# Patient Record
Sex: Male | Born: 1940
Health system: Southern US, Community
[De-identification: ages and names within clinical notes are randomized; demographics above are authoritative.]

## PROBLEM LIST (undated history)

## (undated) DIAGNOSIS — D649 Anemia, unspecified: Secondary | ICD-10-CM

## (undated) DIAGNOSIS — K449 Diaphragmatic hernia without obstruction or gangrene: Secondary | ICD-10-CM

## (undated) DIAGNOSIS — N200 Calculus of kidney: Secondary | ICD-10-CM

## (undated) DIAGNOSIS — K219 Gastro-esophageal reflux disease without esophagitis: Secondary | ICD-10-CM

## (undated) DIAGNOSIS — M1990 Unspecified osteoarthritis, unspecified site: Secondary | ICD-10-CM

## (undated) DIAGNOSIS — T7840XA Allergy, unspecified, initial encounter: Secondary | ICD-10-CM

## (undated) DIAGNOSIS — K5900 Constipation, unspecified: Secondary | ICD-10-CM

## (undated) DIAGNOSIS — C44222 Squamous cell carcinoma of skin of right ear and external auricular canal: Secondary | ICD-10-CM

## (undated) DIAGNOSIS — I509 Heart failure, unspecified: Secondary | ICD-10-CM

## (undated) DIAGNOSIS — G4730 Sleep apnea, unspecified: Secondary | ICD-10-CM

## (undated) DIAGNOSIS — I10 Essential (primary) hypertension: Secondary | ICD-10-CM

## (undated) DIAGNOSIS — E785 Hyperlipidemia, unspecified: Secondary | ICD-10-CM

## (undated) HISTORY — DX: Gastro-esophageal reflux disease without esophagitis: K21.9

## (undated) HISTORY — DX: Essential (primary) hypertension: I10

## (undated) HISTORY — DX: Squamous cell carcinoma of skin of right ear and external auricular canal: C44.222

## (undated) HISTORY — DX: Hyperlipidemia, unspecified: E78.5

## (undated) HISTORY — DX: Allergy, unspecified, initial encounter: T78.40XA

## (undated) HISTORY — DX: Constipation, unspecified: K59.00

## (undated) HISTORY — DX: Heart failure, unspecified: I50.9

## (undated) HISTORY — PX: NECK SURGERY: SHX720

## (undated) HISTORY — DX: Unspecified osteoarthritis, unspecified site: M19.90

## (undated) HISTORY — DX: Sleep apnea, unspecified: G47.30

## (undated) HISTORY — DX: Diaphragmatic hernia without obstruction or gangrene: K44.9

## (undated) HISTORY — PX: COLONOSCOPY: SHX174

## (undated) HISTORY — PX: CARDIAC CATHETERIZATION: SHX172

## (undated) HISTORY — PX: POLYPECTOMY: SHX149

## (undated) HISTORY — DX: Anemia, unspecified: D64.9

---

## 1986-12-24 HISTORY — PX: OTHER SURGICAL HISTORY: SHX169

## 1986-12-24 HISTORY — PX: TONSILLECTOMY: SUR1361

## 1996-12-24 HISTORY — PX: HAMMER TOE SURGERY: SHX385

## 1999-06-06 ENCOUNTER — Ambulatory Visit (HOSPITAL_BASED_OUTPATIENT_CLINIC_OR_DEPARTMENT_OTHER): Admission: RE | Admit: 1999-06-06 | Discharge: 1999-06-06 | Payer: Self-pay | Admitting: Urology

## 1999-09-29 ENCOUNTER — Ambulatory Visit (HOSPITAL_COMMUNITY): Admission: RE | Admit: 1999-09-29 | Discharge: 1999-09-29 | Payer: Self-pay | Admitting: *Deleted

## 2000-08-16 ENCOUNTER — Ambulatory Visit (HOSPITAL_COMMUNITY): Admission: RE | Admit: 2000-08-16 | Discharge: 2000-08-16 | Payer: Self-pay | Admitting: *Deleted

## 2001-03-14 ENCOUNTER — Encounter: Admission: RE | Admit: 2001-03-14 | Discharge: 2001-03-14 | Payer: Self-pay | Admitting: *Deleted

## 2001-03-14 ENCOUNTER — Encounter: Payer: Self-pay | Admitting: *Deleted

## 2001-10-28 ENCOUNTER — Other Ambulatory Visit: Admission: RE | Admit: 2001-10-28 | Discharge: 2001-10-28 | Payer: Self-pay | Admitting: Urology

## 2003-02-26 ENCOUNTER — Emergency Department (HOSPITAL_COMMUNITY): Admission: EM | Admit: 2003-02-26 | Discharge: 2003-02-26 | Payer: Self-pay | Admitting: Emergency Medicine

## 2006-01-03 ENCOUNTER — Encounter: Admission: RE | Admit: 2006-01-03 | Discharge: 2006-01-03 | Payer: Self-pay | Admitting: Otolaryngology

## 2010-02-06 ENCOUNTER — Ambulatory Visit (HOSPITAL_COMMUNITY): Admission: RE | Admit: 2010-02-06 | Discharge: 2010-02-06 | Payer: Self-pay | Admitting: Cardiovascular Disease

## 2010-02-22 ENCOUNTER — Ambulatory Visit (HOSPITAL_COMMUNITY): Admission: RE | Admit: 2010-02-22 | Discharge: 2010-02-22 | Payer: Self-pay | Admitting: Cardiovascular Disease

## 2010-10-19 ENCOUNTER — Emergency Department (HOSPITAL_COMMUNITY): Admission: EM | Admit: 2010-10-19 | Discharge: 2010-10-19 | Payer: Self-pay | Admitting: Family Medicine

## 2010-11-03 ENCOUNTER — Encounter: Admission: RE | Admit: 2010-11-03 | Discharge: 2010-11-03 | Payer: Self-pay | Admitting: Otolaryngology

## 2011-05-11 NOTE — Procedures (Signed)
[place]  Patient:    Martin Stephenson, Martin Stephenson                       MRN: [id number] Proc. Date: 08/16/00 Attending:  Sharyn Dross., M.D.                           Procedure Report  PREOPERATIVE DIAGNOSIS:  Lower gastrointestinal bleeding.  POSTOPERATIVE DIAGNOSIS:  Acute inflammation rectosigmoid area as well as in the anal area. Otherwise, normal colonic examination to the midascending colon.  PROCEDURE PERFORMED:  Colonoscopy.  MEDICATIONS USED:  Demerol 75 mg IV, Versed 6 mg over a 10-minute period of time.  INSTRUMENT USED:  Olympus video pancolonoscope.  ENDOSCOPIST:  Dortha Kern, M.D.  INDICATIONS:  This pleasant 70 year old gentleman is known to me in the past and has had a screening test in the past for colon polyps or cancer, was found to have a normal examination.  He has been relatively stable up until today when the patient states he went to move his bowel and had severe pains that was present.  He also noted that he had back pains earlier, but subsequently after having a couple of bouts of diarrhea, he had a large bloody bowel movement today which brought made him nervous and he contacted me from that standpoint.  The patient has not had any previous GI bleeding that was noted in the past.  OBJECTIVE FINDINGS:   He is a pleasant gentleman who appears to be in no distress.  His vital signs are stable.  The HEENT examination was anicteric. Neck was supple.  Lungs were clear.  Heart had a regular rate and rhythm without heaves, thrills, murmurs or gallop.  The abdomen was soft, no tenderness, no hepatosplenomegaly appreciated.  The extremities are within normal limits.  PLAN:  To proceed with the colonoscopic examination.  INFORMED CONSENT:  The patient was advised of the procedure, the indications and the risks involved.  The patient has agreed to have the procedure performed.  PREOPERATIVE PREPARATION:  The patient was  brought to the endoscopy unit where an IV for IV sedating medication was started.  A monitor was placed on the patient to monitor the patients vital signs and oxygen saturation.  Nasal oxygen at two liters a minute was used and after adequate sedation was performed, the procedure was begun.  BOWEL PREP:  The patient was given a Fleets enema as a bowel prep. Otherwise, no preparation was given.  DESCRIPTION OF PROCEDURE:  The instrument was advanced with the patient lying in the left lateral position to approximately 90 cm in the proximal colon to the midproximal ascending colon area.  There appeared to be no gross abnormalities such as masses, polyps or stricture lesions appreciated.  The vascular pattern appeared to be well within normal limits throughout the entire colon except in the rectosigmoid that was noted.  The mucosal pattern showed evidence of acute inflammatory changes in the rectosigmoid area at approximately 30 cm that was noted as well as in the anal region that was noted.  Multiple photographs were taken of the regions at this time.  There was stool noted in the midproximal transverse and ascending colon which appeared to be greenish in color at this time. Photographs were taken of this as a sign of no evidence of any bleeding that was appreciated at this time.  The instrument was not able to advance  to the cecal region because of the increased stool that was present in the region at this time.  As the instrument was retracted back, there was no evidence of any diverticula changes or inflammatory changes that was noted.  Nor any increased tortuosity that was appreciated.  However, as it was retracted back in to the rectosigmoid area there was noted acute inflammatory changes that was appreciated at this time.  Photographs were taken of the area at this time. There was no evidence of active bleeding that was noted at this time, or any mass, granularities that was  appreciated at this time.  The region was not biopsied today.  The instrument was removed per rectum without difficulty that was noted.  The patient tolerated the procedure well.  TREATMENT: 1. I am going to start the patient with Proctofoam with hydrocortisone to    use twice a day, start the initial dose tonight. 2. To have follow-up with me in the office. 3. To repeat the flexible sigmoidoscopy in the office in the next three to    six weeks depending on the results to determine the course of therapy. DD:  08/16/00 TD:  08/19/00 Job: [id number]

## 2011-10-01 ENCOUNTER — Ambulatory Visit (HOSPITAL_COMMUNITY)
Admission: RE | Admit: 2011-10-01 | Discharge: 2011-10-01 | Disposition: A | Discharge status: Home / Self Care | Payer: Medicare Other | Source: Ambulatory Visit | Attending: Family Medicine | Admitting: Family Medicine

## 2011-10-01 ENCOUNTER — Other Ambulatory Visit: Payer: Self-pay | Admitting: Family Medicine

## 2011-10-01 ENCOUNTER — Ambulatory Visit
Admission: RE | Admit: 2011-10-01 | Discharge: 2011-10-01 | Disposition: A | Discharge status: Home / Self Care | Payer: Medicare Other | Source: Ambulatory Visit | Attending: Family Medicine | Admitting: Family Medicine

## 2011-10-01 ENCOUNTER — Other Ambulatory Visit (HOSPITAL_COMMUNITY): Payer: Self-pay | Admitting: Family Medicine

## 2011-10-01 DIAGNOSIS — R1011 Right upper quadrant pain: Secondary | ICD-10-CM

## 2011-10-01 DIAGNOSIS — R52 Pain, unspecified: Secondary | ICD-10-CM

## 2011-10-01 DIAGNOSIS — N1330 Unspecified hydronephrosis: Secondary | ICD-10-CM | POA: Insufficient documentation

## 2011-10-01 DIAGNOSIS — N390 Urinary tract infection, site not specified: Secondary | ICD-10-CM | POA: Insufficient documentation

## 2012-04-18 ENCOUNTER — Encounter: Payer: Self-pay | Admitting: Gastroenterology

## 2012-05-07 ENCOUNTER — Encounter: Payer: Self-pay | Admitting: Gastroenterology

## 2012-05-07 ENCOUNTER — Ambulatory Visit (AMBULATORY_SURGERY_CENTER): Payer: Medicare Other | Admitting: *Deleted

## 2012-05-07 VITALS — Ht 76.0 in | Wt 246.3 lb

## 2012-05-07 DIAGNOSIS — Z800 Family history of malignant neoplasm of digestive organs: Secondary | ICD-10-CM

## 2012-05-07 DIAGNOSIS — K921 Melena: Secondary | ICD-10-CM

## 2012-05-07 MED ORDER — PEG-KCL-NACL-NASULF-NA ASC-C 100 G PO SOLR: ORAL | Status: DC

## 2012-05-09 ENCOUNTER — Encounter: Payer: Self-pay | Admitting: *Deleted

## 2012-05-09 NOTE — Telephone Encounter (Signed)
error 

## 2012-05-20 ENCOUNTER — Telehealth: Payer: Self-pay | Admitting: Gastroenterology

## 2012-05-20 NOTE — Telephone Encounter (Signed)
Rx for Moviprep called in to pharmacy.  I talked with patient; he has picked prep up from pharmacy.

## 2012-05-20 NOTE — Telephone Encounter (Signed)
MoviPrep called in to [facility] on [address].  Message left for pt on home answering machine. Ezra Sites

## 2012-05-21 ENCOUNTER — Encounter: Payer: Self-pay | Admitting: Gastroenterology

## 2012-05-21 ENCOUNTER — Ambulatory Visit (AMBULATORY_SURGERY_CENTER): Payer: Medicare Other | Admitting: Gastroenterology

## 2012-05-21 VITALS — BP 140/78 | HR 61 | Temp 95.8°F | Resp 20 | Ht 76.0 in | Wt 246.0 lb

## 2012-05-21 DIAGNOSIS — K921 Melena: Secondary | ICD-10-CM

## 2012-05-21 DIAGNOSIS — D126 Benign neoplasm of colon, unspecified: Secondary | ICD-10-CM

## 2012-05-21 DIAGNOSIS — R195 Other fecal abnormalities: Secondary | ICD-10-CM

## 2012-05-21 MED ORDER — SODIUM CHLORIDE 0.9 % IV SOLN: 500.0000 mL | INTRAVENOUS | Status: DC

## 2012-05-21 NOTE — Progress Notes (Signed)
Patient did not have preoperative order for IV antibiotic SSI prophylaxis. (G8918)  Patient did not experience any of the following events: a burn prior to discharge; a fall within the facility; wrong site/side/patient/procedure/implant event; or a hospital transfer or hospital admission upon discharge from the facility. (G8907)  

## 2012-05-21 NOTE — Op Note (Signed)
[facility] [address] [place], [place]  [address]  COLONOSCOPY PROCEDURE REPORT  PATIENT:  Martin Stephenson, Martin Stephenson  MR#:  [id number] BIRTHDATE:  03-Feb-1941, 71 yrs. old  GENDER:  male ENDOSCOPIST:  Judie Petit T. Russella Dar, MD, [facility] Referred by:  Guerry Bruin, M.D. PROCEDURE DATE:  05/21/2012 PROCEDURE:  Colonoscopy with snare polypectomy ASA CLASS:  Class II INDICATIONS:  1) heme positive stool  2) hematochezia MEDICATIONS:   MAC sedation, administered by CRNA, propofol (Diprivan) 150 mg IV DESCRIPTION OF PROCEDURE:   After the risks benefits and alternatives of the procedure were thoroughly explained, informed consent was obtained.  Digital rectal exam was performed and revealed no abnormalities.   The LB [id number] endoscope was introduced through the anus and advanced to the cecum, which was identified by both the appendix and ileocecal valve, without limitations.  The quality of the prep was excellent, using MoviPrep.  The instrument was then slowly withdrawn as the colon was fully examined. <<PROCEDUREIMAGES>> FINDINGS:  A sessile polyp was found in the proximal transverse colon. It was 5 mm in size. Polyp was snared without cautery. Retrieval was successful. Mild diverticulosis was found in the sigmoid colon.  Otherwise normal colonoscopy without other polyps, masses, vascular ectasias, or inflammatory changes. Retroflexed views in the rectum revealed internal hemorrhoids, moderate.  The time to cecum =  3.33  minutes. The scope was then withdrawn (time =  9.75  min) from the patient and the procedure completed.  COMPLICATIONS:  None  ENDOSCOPIC IMPRESSION: 1) 5 mm sessile polyp in the proximal transverse colon 2) Mild diverticulosis in the sigmoid colon 3) Internal hemorrhoids  RECOMMENDATIONS: 1) Await pathology results 2) High fiber diet with liberal fluid intake. 3) Repeat Colonoscopy in 5 years if polyp is adenomatous, otherwise 10 years  Tariq Pernell T.  Russella Dar, MD, [facility]  n. eSIGNED:   Venita Lick. Jireh Elmore at 05/21/2012 10:33 AM  Sharolyn Douglas, [id number]

## 2012-05-21 NOTE — Patient Instructions (Signed)
YOU HAD AN ENDOSCOPIC PROCEDURE TODAY AT THE [facility]: Refer to the procedure report that was given to you for any specific questions about what was found during the examination.  If the procedure report does not answer your questions, please call your gastroenterologist to clarify.  If you requested that your care partner not be given the details of your procedure findings, then the procedure report has been included in a sealed envelope for you to review at your convenience later.  YOU SHOULD EXPECT: Some feelings of bloating in the abdomen. Passage of more gas than usual.  Walking can help get rid of the air that was put into your GI tract during the procedure and reduce the bloating. If you had a lower endoscopy (such as a colonoscopy or flexible sigmoidoscopy) you may notice spotting of blood in your stool or on the toilet paper. If you underwent a bowel prep for your procedure, then you may not have a normal bowel movement for a few days.  DIET: Your first meal following the procedure should be a light meal and then it is ok to progress to your normal diet.  A half-sandwich or bowl of soup is an example of a good first meal.  Heavy or fried foods are harder to digest and may make you feel nauseous or bloated.  Likewise meals heavy in dairy and vegetables can cause extra gas to form and this can also increase the bloating.  Drink plenty of fluids but you should avoid alcoholic beverages for 24 hours.  ACTIVITY: Your care partner should take you home directly after the procedure.  You should plan to take it easy, moving slowly for the rest of the day.  You can resume normal activity the day after the procedure however you should NOT DRIVE or use heavy machinery for 24 hours (because of the sedation medicines used during the test).    SYMPTOMS TO REPORT IMMEDIATELY: A gastroenterologist can be reached at any hour.  During normal business hours, 8:30 AM to 5:00 PM Monday through Friday,  call [phone].  After hours and on weekends, please call the GI answering service at [phone] who will take a message and have the physician on call contact you.   Following lower endoscopy (colonoscopy or flexible sigmoidoscopy):  Excessive amounts of blood in the stool  Significant tenderness or worsening of abdominal pains  Swelling of the abdomen that is new, acute  Fever of 100F or higher  Following upper endoscopy (EGD)  Vomiting of blood or coffee ground material  FOLLOW UP: If any biopsies were taken you will be contacted by phone or by letter within the next 1-3 weeks.  Call your gastroenterologist if you have not heard about the biopsies in 3 weeks.  Our staff will call the home number listed on your records the next business day following your procedure to check on you and address any questions or concerns that you may have at that time regarding the information given to you following your procedure. This is a courtesy call and so if there is no answer at the home number and we have not heard from you through the emergency physician on call, we will assume that you have returned to your regular daily activities without incident.  SIGNATURES/CONFIDENTIALITY: You and/or your care partner have signed paperwork which will be entered into your electronic medical record.  These signatures attest to the fact that that the information above on your After Visit Summary has  been reviewed and is understood.  Full responsibility of the confidentiality of this discharge information lies with you and/or your care-partner.  

## 2012-05-22 ENCOUNTER — Telehealth: Payer: Self-pay | Admitting: *Deleted

## 2012-05-22 NOTE — Telephone Encounter (Signed)
  Follow up Call-  Call back number 05/21/2012  Post procedure Call Back phone  # [phone]  Permission to leave phone message Yes     Patient questions:  Do you have a fever, pain , or abdominal swelling? no Pain Score  0 *  Have you tolerated food without any problems? yes  Have you been able to return to your normal activities? yes  Do you have any questions about your discharge instructions: Diet   no Medications  no Follow up visit  no  Do you have questions or concerns about your Care? no  Actions: * If pain score is 4 or above: No action needed, pain <4.

## 2012-05-26 ENCOUNTER — Encounter: Payer: Self-pay | Admitting: Gastroenterology

## 2012-10-16 ENCOUNTER — Telehealth: Payer: Self-pay | Admitting: Medical Oncology

## 2012-10-16 NOTE — Telephone Encounter (Signed)
Opened chart-wrong pt

## 2014-09-10 ENCOUNTER — Other Ambulatory Visit: Payer: Self-pay | Admitting: Orthopaedic Surgery

## 2014-09-10 DIAGNOSIS — M5136 Other intervertebral disc degeneration, lumbar region: Secondary | ICD-10-CM

## 2014-09-18 ENCOUNTER — Other Ambulatory Visit: Payer: Self-pay | Admitting: Orthopaedic Surgery

## 2014-09-18 ENCOUNTER — Ambulatory Visit
Admission: RE | Admit: 2014-09-18 | Discharge: 2014-09-18 | Disposition: A | Discharge status: Home / Self Care | Payer: Medicare Other | Source: Ambulatory Visit | Attending: Orthopaedic Surgery | Admitting: Orthopaedic Surgery

## 2014-09-18 DIAGNOSIS — T1590XA Foreign body on external eye, part unspecified, unspecified eye, initial encounter: Secondary | ICD-10-CM

## 2014-09-18 DIAGNOSIS — M5136 Other intervertebral disc degeneration, lumbar region: Secondary | ICD-10-CM

## 2015-08-25 ENCOUNTER — Other Ambulatory Visit: Payer: Self-pay | Admitting: *Deleted

## 2016-08-11 ENCOUNTER — Emergency Department (HOSPITAL_COMMUNITY)
Admission: EM | Admit: 2016-08-11 | Discharge: 2016-08-11 | Disposition: A | Discharge status: Home / Self Care | Payer: Medicare Other | Attending: Emergency Medicine | Admitting: Emergency Medicine

## 2016-08-11 ENCOUNTER — Encounter (HOSPITAL_COMMUNITY): Payer: Self-pay | Admitting: Emergency Medicine

## 2016-08-11 ENCOUNTER — Emergency Department (HOSPITAL_COMMUNITY): Payer: Medicare Other

## 2016-08-11 DIAGNOSIS — Z7982 Long term (current) use of aspirin: Secondary | ICD-10-CM | POA: Insufficient documentation

## 2016-08-11 DIAGNOSIS — I509 Heart failure, unspecified: Secondary | ICD-10-CM | POA: Diagnosis not present

## 2016-08-11 DIAGNOSIS — R319 Hematuria, unspecified: Secondary | ICD-10-CM | POA: Insufficient documentation

## 2016-08-11 HISTORY — DX: Calculus of kidney: N20.0

## 2016-08-11 LAB — COMPREHENSIVE METABOLIC PANEL
ALT: 15 U/L — ABNORMAL LOW (ref 17–63)
AST: 23 U/L (ref 15–41)
Albumin: 3.6 g/dL (ref 3.5–5.0)
Alkaline Phosphatase: 66 U/L (ref 38–126)
Anion gap: 7 (ref 5–15)
BUN: 27 mg/dL — ABNORMAL HIGH (ref 6–20)
CO2: 26 mmol/L (ref 22–32)
Calcium: 9.8 mg/dL (ref 8.9–10.3)
Chloride: 106 mmol/L (ref 101–111)
Creatinine, Ser: 1.68 mg/dL — ABNORMAL HIGH (ref 0.61–1.24)
GFR calc Af Amer: 45 mL/min — ABNORMAL LOW (ref >60)
GFR calc non Af Amer: 39 mL/min — ABNORMAL LOW (ref >60)
Glucose, Bld: 96 mg/dL (ref 65–99)
Potassium: 4.4 mmol/L (ref 3.5–5.1)
Sodium: 139 mmol/L (ref 135–145)
Total Bilirubin: 0.4 mg/dL (ref 0.3–1.2)
Total Protein: 6.8 g/dL (ref 6.5–8.1)

## 2016-08-11 LAB — URINALYSIS, ROUTINE W REFLEX MICROSCOPIC
Bilirubin Urine: NEGATIVE
Glucose, UA: NEGATIVE mg/dL
Ketones, ur: NEGATIVE mg/dL
Leukocytes, UA: NEGATIVE
Nitrite: NEGATIVE
Protein, ur: NEGATIVE mg/dL
Specific Gravity, Urine: 1.02 (ref 1.005–1.030)
pH: 5.5 (ref 5.0–8.0)

## 2016-08-11 LAB — URINE MICROSCOPIC-ADD ON: WBC, UA: NONE SEEN WBC/hpf (ref 0–5)

## 2016-08-11 LAB — CBC
HCT: 37.4 % — ABNORMAL LOW (ref 39.0–52.0)
Hemoglobin: 12.5 g/dL — ABNORMAL LOW (ref 13.0–17.0)
MCH: 31.8 pg (ref 26.0–34.0)
MCHC: 33.4 g/dL (ref 30.0–36.0)
MCV: 95.2 fL (ref 78.0–100.0)
Platelets: 271 10*3/uL (ref 150–400)
RBC: 3.93 MIL/uL — ABNORMAL LOW (ref 4.22–5.81)
RDW: 13.8 % (ref 11.5–15.5)
WBC: 9.8 10*3/uL (ref 4.0–10.5)

## 2016-08-11 MED ORDER — SODIUM CHLORIDE 0.9 % IV BOLUS (SEPSIS)
1000.0000 mL | Freq: Once | INTRAVENOUS | Status: AC
  Administered 2016-08-11: 1000 mL via INTRAVENOUS

## 2016-08-11 NOTE — ED Triage Notes (Signed)
Pt. reports hematuria , urinary urgency and left lower back pain onset this evening , denies fever or chills .

## 2016-08-11 NOTE — ED Provider Notes (Signed)
[facility] DEPT Provider Note   CSN: [id number] Arrival date & time: 08/11/16  0056     History   Chief Complaint Chief Complaint  Patient presents with  . Hematuria  . Back Pain    HPI Martin Stephenson is a 75 y.o. male.  Pt said that he developed left sided back pain with blood in his urine tonight.  Pt noticed it around midnight.  His wife said that he passed a lot of blood which is why they came in tonight.  Pt said that he has had a kidney stone in the past, but not for a few years.  The pt said that he does not need anything for pain.      Past Medical History:  Diagnosis Date  . Anemia   . Arthritis   . Congestive heart failure ([place])   . Hiatal hernia   . Hyperlipidemia   . Hypertension   . Kidney stone   . Sleep apnea     There are no active problems to display for this patient.   Past Surgical History:  Procedure Laterality Date  . HAMMER TOE SURGERY  1998   1st toe right foot  . hydrocelectomy  1988  . TONSILLECTOMY  1988       Home Medications    Prior to Admission medications   Medication Sig Start Date End Date Taking? Authorizing Provider  amLODipine (NORVASC) 2.5 MG tablet Take 2.5 mg by mouth daily.  05/02/12   Historical Provider, MD  aspirin 81 MG tablet Take 81 mg by mouth daily.    Historical Provider, MD  calcium gluconate 500 MG tablet Take 500 mg by mouth daily.    Historical Provider, MD  celecoxib (CELEBREX) 200 MG capsule Take 200 mg by mouth daily.    Historical Provider, MD  CRESTOR 10 MG tablet Take 10 mg by mouth. Takes 1 tablet Mon, Wed, and Fri 05/06/12   Historical Provider, MD  digoxin (LANOXIN) 0.125 MG tablet Take 125 mcg by mouth daily.  05/02/12   Historical Provider, MD  Ferrous Sulfate (IRON) 325 (65 FE) MG TABS Take by mouth daily.    Historical Provider, MD  fluticasone (FLONASE) 50 MCG/ACT nasal spray Place 1 spray into the nose daily.  02/04/12   Historical Provider, MD  Multiple Vitamin (MULTIVITAMIN) tablet  Take 1 tablet by mouth daily.    Historical Provider, MD    Family History Family History  Problem Relation Age of Onset  . Colon cancer Mother   . Stomach cancer Neg Hx     Social History Social History  Substance Use Topics  . Smoking status: Never Smoker  . Smokeless tobacco: Never Used  . Alcohol use No     Allergies   Furacin [nitrofurazone]   Review of Systems Review of Systems  Genitourinary: Positive for flank pain and hematuria.  All other systems reviewed and are negative.    Physical Exam Updated Vital Signs BP 122/76   Pulse (!) 58   Temp 97.7 F (36.5 C) (Oral)   Resp 12   Ht 6\' 4"  (1.93 m)   Wt 244 lb 8 oz (110.9 kg)   SpO2 99%   BMI 29.76 kg/m   Physical Exam  Constitutional: He is oriented to person, place, and time. He appears well-developed and well-nourished.  HENT:  Head: Normocephalic and atraumatic.  Right Ear: External ear normal.  Left Ear: External ear normal.  Nose: Nose normal.  Mouth/Throat: Oropharynx is clear and moist.  Eyes: Conjunctivae and EOM are normal. Pupils are equal, round, and reactive to light.  Neck: Normal range of motion. Neck supple.  Cardiovascular: Normal rate, regular rhythm, normal heart sounds and intact distal pulses.   Pulmonary/Chest: Effort normal and breath sounds normal.  Abdominal: Soft. Bowel sounds are normal.  Musculoskeletal: Normal range of motion.  Neurological: He is alert and oriented to person, place, and time.  Skin: Skin is warm and dry.  Psychiatric: He has a normal mood and affect. His behavior is normal. Judgment and thought content normal.  Nursing note and vitals reviewed.    ED Treatments / Results  Labs (all labs ordered are listed, but only abnormal results are displayed) Labs Reviewed  COMPREHENSIVE METABOLIC PANEL - Abnormal; Notable for the following:       Result Value   BUN 27 (*)    Creatinine, Ser 1.68 (*)    ALT 15 (*)    GFR calc non Af Amer 39 (*)    GFR  calc Af Amer 45 (*)    All other components within normal limits  CBC - Abnormal; Notable for the following:    RBC 3.93 (*)    Hemoglobin 12.5 (*)    HCT 37.4 (*)    All other components within normal limits  URINALYSIS, ROUTINE W REFLEX MICROSCOPIC (NOT AT [facility]) - Abnormal; Notable for the following:    Hgb urine dipstick LARGE (*)    All other components within normal limits  URINE MICROSCOPIC-ADD ON - Abnormal; Notable for the following:    Squamous Epithelial / LPF 0-5 (*)    Bacteria, UA RARE (*)    All other components within normal limits    EKG  EKG Interpretation None       Radiology Ct Renal Stone Study  Result Date: 08/11/2016 CLINICAL DATA:  Hematuria and low back pain EXAM: CT ABDOMEN AND PELVIS WITHOUT CONTRAST TECHNIQUE: Multidetector CT imaging of the abdomen and pelvis was performed following the standard protocol without oral or intravenous contrast material administration. COMPARISON:  October 01, 2011 FINDINGS: Lower chest:  Lung bases are clear. Hepatobiliary: No focal liver lesions are evident on this noncontrast enhanced study. Gallbladder wall is not appreciably thickened. There is no biliary duct dilatation. Pancreas: No pancreatic mass or inflammatory focus. Spleen: No splenic lesions are evident. Adrenals/Urinary Tract: Adrenals appear within normal limits bilaterally. Kidneys bilaterally show no appreciable mass or hydronephrosis on either side. There is no renal or ureteral calculus on either side. The urinary bladder is midline with wall thickness within normal limits. Stomach/Bowel: There are occasional sigmoid diverticula without diverticulitis. There is no bowel wall or mesenteric thickening. There is no bowel obstruction. No free air or portal venous air evident. Vascular/Lymphatic: There is atherosclerotic calcification in the aorta. There is no abdominal aortic aneurysm. The major mesenteric vessels appear patent on this noncontrast enhanced study. There  are scattered subcentimeter mesenteric lymph nodes. There is no adenopathy by size criteria in the abdomen or pelvis. Reproductive: Prostate and seminal vesicles appear unremarkable except for a small calculus in the inferior prostate on the left. There is no pelvic mass or pelvic fluid collection. Other: The appendix appears normal. There is no abscess or adenopathy in the abdomen or pelvis. There is a minimal ventral hernia containing only fat. There is fat in each inguinal ring. Musculoskeletal: There is degenerative change in the lumbar spine with vacuum phenomenon at L4-5. 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 There is no blastic or lytic bone lesion. There  is no intramuscular or abdominal wall lesion. IMPRESSION: No renal or ureteral calculus. No hydronephrosis. No urinary bladder wall thickening. A cause for hematuria has not been established with this study. Scattered sigmoid diverticula without diverticulitis. No bowel obstruction. No abscess. Appendix appears normal. Aortic atherosclerosis. Subcentimeter scattered mesenteric lymph nodes, a finding that is regarded as nonspecific. No frank adenopathy by size criteria. Small ventral hernia containing only fat. Electronically Signed   By: Lowella Grip III M.D.   On: 08/11/2016 07:36    Procedures Procedures (including critical care time)  Medications Ordered in ED Medications  sodium chloride 0.9 % bolus 1,000 mL (0 mLs Intravenous Stopped 08/11/16 0738)     Initial Impression / Assessment and Plan / ED Course  I have reviewed the triage vital signs and the nursing notes.  Pertinent labs & imaging results that were available during my care of the patient were reviewed by me and considered in my medical decision making (see chart for details).  Clinical Course   Ct ok.  Pt knows to f/u with urology.  Pt does not want anything for pain.  Return if worse.  Final Clinical Impressions(s) / ED Diagnoses   Final diagnoses:  Hematuria    New  Prescriptions New Prescriptions   No medications on file     Isla Pence, MD 08/11/16 [id number]

## 2016-08-11 NOTE — ED Notes (Signed)
Patient transported to CT 

## 2016-08-11 NOTE — ED Notes (Signed)
While walking out to call a pt name for another room the pt wife yelled back to this RN, "how about to get Martin Stephenson back instead, they have been out here since 11" Apologized for the wait but stated that we take back based on acuity and pt would be moved back as soon as it is possible. Please also note pt did not check in until 1am, unlike what family member stated (11pm)

## 2016-08-12 LAB — URINE CULTURE: Culture: NO GROWTH

## 2016-11-07 ENCOUNTER — Ambulatory Visit (INDEPENDENT_AMBULATORY_CARE_PROVIDER_SITE_OTHER): Payer: Medicare Other

## 2016-11-07 ENCOUNTER — Ambulatory Visit (HOSPITAL_COMMUNITY)
Admission: EM | Admit: 2016-11-07 | Discharge: 2016-11-07 | Disposition: A | Discharge status: Home / Self Care | Payer: Medicare Other | Attending: Emergency Medicine | Admitting: Emergency Medicine

## 2016-11-07 ENCOUNTER — Encounter (HOSPITAL_COMMUNITY): Payer: Self-pay | Admitting: Family Medicine

## 2016-11-07 DIAGNOSIS — M19071 Primary osteoarthritis, right ankle and foot: Secondary | ICD-10-CM

## 2016-11-07 DIAGNOSIS — M79671 Pain in right foot: Secondary | ICD-10-CM

## 2016-11-07 MED ORDER — IBUPROFEN 800 MG PO TABS: 800.0000 mg | ORAL_TABLET | Freq: Three times a day (TID) | ORAL | 0 refills | Status: AC

## 2016-11-07 NOTE — ED Provider Notes (Signed)
CSN: [id number]     Arrival date & time 11/07/16  1000 History   None    Chief Complaint  Patient presents with  . Foot Pain   (Consider location/radiation/quality/duration/timing/severity/associated sxs/prior Treatment) Martin Stephenson is a 75 y.o male, with history of HTN, HLD, Arthritis, CHF, presents today for right foot pain onset 4 days ago with swelling. Onset was sudden. Patient specifically points to the right first metatarsal area as the location of the pain. Pain is worst with touch and movement. Patient denies history of gout and arthritis in his foot. Patient have had surgeries for hammer toe and bunionectomy done at the same location in the past. Patient denies recent travel and denies leg pain. Patient denies any alleviating factors. He has not tried anything at home to help with the pain. He also denies fever at home.       Past Medical History:  Diagnosis Date  . Anemia   . Arthritis   . Congestive heart failure ([place])   . Hiatal hernia   . Hyperlipidemia   . Hypertension   . Kidney stone   . Sleep apnea    Past Surgical History:  Procedure Laterality Date  . HAMMER TOE SURGERY  1998   1st toe right foot  . hydrocelectomy  1988  . TONSILLECTOMY  1988   Family History  Problem Relation Age of Onset  . Colon cancer Mother   . Stomach cancer Neg Hx    Social History  Substance Use Topics  . Smoking status: Never Smoker  . Smokeless tobacco: Never Used  . Alcohol use No    Review of Systems  Constitutional: Negative for chills, fatigue and fever.  HENT: Positive for rhinorrhea. Negative for congestion and sneezing.   Respiratory: Negative for cough and shortness of breath.   Cardiovascular: Negative for chest pain and palpitations.  Musculoskeletal:       Positive for R foot pain  Neurological: Negative for dizziness and headaches.    Allergies  Furacin [nitrofurazone]  Home Medications   Prior to Admission medications   Medication Sig Start Date End  Date Taking? Authorizing Provider  amLODipine (NORVASC) 2.5 MG tablet Take 2.5 mg by mouth daily.  05/02/12   Historical Provider, MD  aspirin 81 MG tablet Take 81 mg by mouth daily.    Historical Provider, MD  calcium gluconate 500 MG tablet Take 500 mg by mouth daily.    Historical Provider, MD  celecoxib (CELEBREX) 200 MG capsule Take 200 mg by mouth daily.    Historical Provider, MD  CRESTOR 10 MG tablet Take 10 mg by mouth. Takes 1 tablet Mon, Wed, and Fri 05/06/12   Historical Provider, MD  digoxin (LANOXIN) 0.125 MG tablet Take 125 mcg by mouth daily.  05/02/12   Historical Provider, MD  Ferrous Sulfate (IRON) 325 (65 FE) MG TABS Take by mouth daily.    Historical Provider, MD  fluticasone (FLONASE) 50 MCG/ACT nasal spray Place 1 spray into the nose daily.  02/04/12   Historical Provider, MD  ibuprofen (ADVIL,MOTRIN) 800 MG tablet Take 1 tablet (800 mg total) by mouth 3 (three) times daily. 11/07/16 11/12/16  Barry Dienes, NP  Multiple Vitamin (MULTIVITAMIN) tablet Take 1 tablet by mouth daily.    Historical Provider, MD   Meds Ordered and Administered this Visit  Medications - No data to display  BP 156/82   Pulse 92   Temp 98 F (36.7 C)   Resp 16   SpO2 97%  No  data found.   Physical Exam  Constitutional: He is oriented to person, place, and time. He appears well-developed and well-nourished.  HENT:  Head: Normocephalic and atraumatic.  Cardiovascular: Normal rate, regular rhythm and normal heart sounds.   Pulmonary/Chest: Effort normal and breath sounds normal. No respiratory distress. He has no wheezes.  Abdominal: Soft. Bowel sounds are normal. He exhibits no distension. There is no tenderness.  Musculoskeletal:  Right foot is swollen, has pain on palpation over the R first metatarsal area. Has full ROM. Sensation intact. Dorsalis pulse 2+. Skin unremarkable.   Neurological: He is alert and oriented to person, place, and time.  Skin: Skin is warm.    Urgent Care Course    Clinical Course     Procedures (including critical care time)  Labs Review Labs Reviewed - No data to display  Imaging Review Dg Foot Complete Right  Result Date: 11/07/2016 CLINICAL DATA:  Pain and swelling EXAM: RIGHT FOOT COMPLETE - 3+ VIEW COMPARISON:  None. FINDINGS: Frontal, oblique, lateral views were obtained. There has been bunionectomy on the right with a PN present in the distal first metatarsal. A screw is seen in the distal third metatarsal. There has been a prior fracture of the distal fourth metatarsal with bony remodeling. There is no acute fracture or dislocation. There is osteoarthritic change in the first MTP joint. Other joint spaces appear normal. No erosive change. There is a posterior calcaneal spur. IMPRESSION: Postoperative change in the distal first and third metatarsals. Old healed fracture distal fourth metatarsal. No acute fracture or dislocation. There is osteoarthritic change in the first MTP joint. There is a posterior calcaneal spur. Electronically Signed   By: Lowella Grip III M.D.   On: 11/07/2016 11:18    MDM   1. Primary osteoarthritis of right foot   2. Foot pain, right    Right foot xray showed osteoarthritis changes int he first MTP joint, this is most likely the cause of his pain. Patient informed of this results. Patient is safe to be discharge home to f/u outpatient. Rx for Ibuprofen 800mg  TID given for 5 days. Patient informed to  f/u with his PCP next week for re-evaluation.    Barry Dienes, NP 11/07/16 1137

## 2016-11-07 NOTE — ED Triage Notes (Signed)
Pt here for right foot pain and swelling, redness and warmth. sts started Saturday. Denies injury.

## 2016-11-07 NOTE — Discharge Instructions (Signed)
Please take the ibuprofen three times a day for 5 days. Please follow up with your regular doctor next week for re-evaluation. I provided a copy of your xray in this document. I hope you feel better

## 2016-11-08 ENCOUNTER — Ambulatory Visit: Payer: Self-pay | Admitting: Podiatry

## 2017-02-04 ENCOUNTER — Ambulatory Visit (INDEPENDENT_AMBULATORY_CARE_PROVIDER_SITE_OTHER): Payer: Medicare Other

## 2017-02-04 ENCOUNTER — Other Ambulatory Visit: Payer: Self-pay | Admitting: Cardiology

## 2017-02-04 ENCOUNTER — Encounter: Payer: Self-pay | Admitting: Podiatry

## 2017-02-04 ENCOUNTER — Ambulatory Visit (INDEPENDENT_AMBULATORY_CARE_PROVIDER_SITE_OTHER): Payer: Medicare Other | Admitting: Podiatry

## 2017-02-04 VITALS — BP 166/72 | HR 55 | Resp 16 | Ht 76.0 in | Wt 240.0 lb

## 2017-02-04 DIAGNOSIS — L84 Corns and callosities: Secondary | ICD-10-CM

## 2017-02-04 DIAGNOSIS — M79671 Pain in right foot: Secondary | ICD-10-CM

## 2017-02-04 DIAGNOSIS — M779 Enthesopathy, unspecified: Secondary | ICD-10-CM | POA: Diagnosis not present

## 2017-02-04 DIAGNOSIS — I872 Venous insufficiency (chronic) (peripheral): Secondary | ICD-10-CM

## 2017-02-04 MED ORDER — TRIAMCINOLONE ACETONIDE 10 MG/ML IJ SUSP
10.0000 mg | Freq: Once | INTRAMUSCULAR | Status: AC
  Administered 2017-02-04: 10 mg

## 2017-02-04 NOTE — Progress Notes (Signed)
   Subjective:    Patient ID: Martin Stephenson, male    DOB: 1941/01/13, 76 y.o.   MRN: [id number]  HPI  Chief Complaint  Patient presents with  . Callouses    Right; Plantar Forefoot x 3 months. Pt states that the callous is very painful.   . Foot Pain    Right; Medial side and Top of foot beneath 2nd and 3rd toes x 3 months. Pt states that he also gets intermittent swelling in the right foot   . Nail Problem    BL; Discolored toe nails.        Review of Systems     Objective:   Physical Exam        Assessment & Plan:

## 2017-02-04 NOTE — Progress Notes (Signed)
Subjective:     Patient ID: Martin Stephenson, male   DOB: Sep 27, 1941, 76 y.o.   MRN: [id number]  HPI patient presents stating I have had a lot of pain under my right foot and it's gotten worse over the last few months   Review of Systems  All other systems reviewed and are negative.      Objective:   Physical Exam  Constitutional: He is oriented to person, place, and time.  Cardiovascular: Intact distal pulses.   Musculoskeletal: Normal range of motion.  Neurological: He is oriented to person, place, and time.  Skin: Skin is warm.  Nursing note and vitals reviewed.  neurovascular status intact muscle strength adequate range of motion within normal limits with patient found to have severe keratotic lesion sub-fourth metatarsal right with inflammation and fluid buildup around the area. Patient's noted to have good digital perfusion and is well oriented 3     Assessment:     Inflammatory capsulitis sub-fourth metatarsal right with fluid buildup and lesion formation    Plan:     H&P x-rays reviewed and careful plantar injection administered 3 mg Kenalog 5 mg Xylocaine and debridement of lesion accomplished. Applied padding to take pressure off the area and reappoint to recheck  X-rays indicate previous osteotomy surgery with no change or advanced arthritis noted

## 2017-02-21 ENCOUNTER — Emergency Department (HOSPITAL_COMMUNITY): Payer: Medicare Other

## 2017-02-21 ENCOUNTER — Encounter (HOSPITAL_COMMUNITY): Payer: Self-pay

## 2017-02-21 ENCOUNTER — Emergency Department (HOSPITAL_COMMUNITY)
Admission: EM | Admit: 2017-02-21 | Discharge: 2017-02-21 | Disposition: A | Discharge status: Home / Self Care | Payer: Medicare Other | Attending: Emergency Medicine | Admitting: Emergency Medicine

## 2017-02-21 DIAGNOSIS — I110 Hypertensive heart disease with heart failure: Secondary | ICD-10-CM | POA: Insufficient documentation

## 2017-02-21 DIAGNOSIS — M7989 Other specified soft tissue disorders: Secondary | ICD-10-CM | POA: Diagnosis present

## 2017-02-21 DIAGNOSIS — Z79899 Other long term (current) drug therapy: Secondary | ICD-10-CM | POA: Diagnosis not present

## 2017-02-21 DIAGNOSIS — Z7982 Long term (current) use of aspirin: Secondary | ICD-10-CM | POA: Diagnosis not present

## 2017-02-21 DIAGNOSIS — M10072 Idiopathic gout, left ankle and foot: Secondary | ICD-10-CM | POA: Diagnosis not present

## 2017-02-21 DIAGNOSIS — I509 Heart failure, unspecified: Secondary | ICD-10-CM | POA: Diagnosis not present

## 2017-02-21 DIAGNOSIS — M109 Gout, unspecified: Secondary | ICD-10-CM

## 2017-02-21 LAB — CBC
HCT: 35.3 % — ABNORMAL LOW (ref 39.0–52.0)
Hemoglobin: 11.5 g/dL — ABNORMAL LOW (ref 13.0–17.0)
MCH: 30.7 pg (ref 26.0–34.0)
MCHC: 32.6 g/dL (ref 30.0–36.0)
MCV: 94.4 fL (ref 78.0–100.0)
Platelets: 216 10*3/uL (ref 150–400)
RBC: 3.74 MIL/uL — ABNORMAL LOW (ref 4.22–5.81)
RDW: 14.1 % (ref 11.5–15.5)
WBC: 8.1 10*3/uL (ref 4.0–10.5)

## 2017-02-21 LAB — BASIC METABOLIC PANEL
Anion gap: 8 (ref 5–15)
BUN: 16 mg/dL (ref 6–20)
CO2: 26 mmol/L (ref 22–32)
Calcium: 9.5 mg/dL (ref 8.9–10.3)
Chloride: 105 mmol/L (ref 101–111)
Creatinine, Ser: 1.46 mg/dL — ABNORMAL HIGH (ref 0.61–1.24)
GFR calc Af Amer: 53 mL/min — ABNORMAL LOW (ref >60)
GFR calc non Af Amer: 46 mL/min — ABNORMAL LOW (ref >60)
Glucose, Bld: 99 mg/dL (ref 65–99)
Potassium: 4.1 mmol/L (ref 3.5–5.1)
Sodium: 139 mmol/L (ref 135–145)

## 2017-02-21 LAB — I-STAT TROPONIN, ED: Troponin i, poc: 0 ng/mL (ref 0.00–0.08)

## 2017-02-21 MED ORDER — COLCHICINE 0.6 MG PO TABS: 0.6000 mg | ORAL_TABLET | Freq: Every day | ORAL | 0 refills | Status: DC

## 2017-02-21 MED ORDER — PREDNISONE 20 MG PO TABS: 20.0000 mg | ORAL_TABLET | Freq: Every day | ORAL | 0 refills | Status: DC

## 2017-02-21 MED ORDER — OXYCODONE-ACETAMINOPHEN 5-325 MG PO TABS: 1.0000 | ORAL_TABLET | Freq: Four times a day (QID) | ORAL | 0 refills | Status: DC | PRN

## 2017-02-21 NOTE — ED Notes (Signed)
Patient transported to X-ray 

## 2017-02-21 NOTE — ED Triage Notes (Addendum)
Per pT, Pt has Hx of bilateral leg swelling, but today, pt noted that the pain in the left leg increased along with swelling. Pt has bilateral pulses noted with 3+ in right and 2+ in the left pedal pulses. SOB x 6 months

## 2017-02-21 NOTE — ED Provider Notes (Signed)
[facility] DEPT Provider Note   CSN: [id number] Arrival date & time: 02/21/17  1706     History   Chief Complaint Chief Complaint  Patient presents with  . Leg Swelling    HPI Martin Stephenson is a 76 y.o. male.  HPI Patient resents with pain and swelling in his left foot. Some redness and swelling. No systemic fevers. Has a history of mild CHF. Has had some similar pain in his right foot in the past. States he went to an urgent care and they were trying to rule out gout did not take a sample in the joint at that time. Has somewhat frequent bowel movements per family members. No nausea or vomiting. He is on Lasix as needed. Both of his lower legs are swollen, worse on the left side. His right leg is somewhat less swollen than normal.   Past Medical History:  Diagnosis Date  . Anemia   . Arthritis   . Congestive heart failure ([place])   . Hiatal hernia   . Hyperlipidemia   . Hypertension   . Kidney stone   . Sleep apnea     There are no active problems to display for this patient.   Past Surgical History:  Procedure Laterality Date  . HAMMER TOE SURGERY  1998   1st toe right foot  . hydrocelectomy  1988  . TONSILLECTOMY  1988       Home Medications    Prior to Admission medications   Medication Sig Start Date End Date Taking? Authorizing Provider  aspirin 81 MG tablet Take 81 mg by mouth daily.   Yes Historical Provider, MD  Calcium Carb-Cholecalciferol (CALCIUM 600/VITAMIN D3 PO) Take 1 tablet by mouth daily.    Yes Historical Provider, MD  furosemide (LASIX) 40 MG tablet Take 40 mg by mouth See admin instructions. Only takes when needed, but takes for 3 days in a row when needed for fluid 12/12/16  Yes Historical Provider, MD  losartan (COZAAR) 25 MG tablet Take 25 mg by mouth every evening.  02/02/17  Yes Historical Provider, MD  metoprolol succinate (TOPROL-XL) 50 MG 24 hr tablet Take 50 mg by mouth daily. Take with or immediately following a meal.   Yes  Historical Provider, MD  rosuvastatin (CRESTOR) 10 MG tablet Take 10 mg by mouth every Monday, Wednesday, and Friday.   Yes Historical Provider, MD  colchicine 0.6 MG tablet Take 1 tablet (0.6 mg total) by mouth daily. 02/21/17   Davonna Belling, MD  oxyCODONE-acetaminophen (PERCOCET/ROXICET) 5-325 MG tablet Take 1-2 tablets by mouth every 6 (six) hours as needed for severe pain. 02/21/17   Davonna Belling, MD  predniSONE (DELTASONE) 20 MG tablet Take 1 tablet (20 mg total) by mouth daily. 02/21/17   Davonna Belling, MD    Family History Family History  Problem Relation Age of Onset  . Colon cancer Mother   . Stomach cancer Neg Hx     Social History Social History  Substance Use Topics  . Smoking status: Never Smoker  . Smokeless tobacco: Never Used  . Alcohol use No     Allergies   Furacin [nitrofurazone]   Review of Systems Review of Systems  Constitutional: Negative for appetite change.  HENT: Positive for congestion.   Respiratory: Negative for shortness of breath.   Cardiovascular: Negative for chest pain.  Gastrointestinal: Negative for abdominal distention and abdominal pain.  Genitourinary: Negative for flank pain.  Musculoskeletal: Positive for joint swelling.  Skin: Positive for color change.  Neurological: Negative for weakness and numbness.  Psychiatric/Behavioral: Negative for confusion.     Physical Exam Updated Vital Signs BP 151/68   Pulse 66   Temp 98.9 F (37.2 C) (Oral)   Resp 18   Ht 6\' 4"  (1.93 m)   Wt 240 lb (108.9 kg)   SpO2 99%   BMI 29.21 kg/m   Physical Exam  Constitutional: He appears well-developed.  HENT:  Head: Atraumatic.  Eyes: EOM are normal.  Neck: Neck supple.  Cardiovascular: Normal rate.   Pulmonary/Chest: Effort normal.  Abdominal: Soft. There is no tenderness.  Musculoskeletal: He exhibits edema.  Pitting edema bilateral lower extremities, worse on the left side. The first MTP joint has erythema with some swelling in  the feet. Pain with movement at that joint. There is a callus medially over it but other wise skin seems intact.  Neurological: He is alert.  Skin: Skin is warm.     ED Treatments / Results  Labs (all labs ordered are listed, but only abnormal results are displayed) Labs Reviewed  BASIC METABOLIC PANEL - Abnormal; Notable for the following:       Result Value   Creatinine, Ser 1.46 (*)    GFR calc non Af Amer 46 (*)    GFR calc Af Amer 53 (*)    All other components within normal limits  CBC - Abnormal; Notable for the following:    RBC 3.74 (*)    Hemoglobin 11.5 (*)    HCT 35.3 (*)    All other components within normal limits  I-STAT TROPOININ, ED    EKG  EKG Interpretation  Date/Time:  Thursday February 21 2017 17:23:08 EST Ventricular Rate:  71 PR Interval:  166 QRS Duration: 88 QT Interval:  366 QTC Calculation: 397 R Axis:   38 Text Interpretation:  Normal sinus rhythm Normal ECG Confirmed by Alvino Chapel  MD, Kahlie Deutscher [id number]) on 02/21/2017 10:12:15 PM       Radiology Dg Chest 2 View  Result Date: 02/21/2017 CLINICAL DATA:  Bilateral leg swelling EXAM: CHEST  2 VIEW COMPARISON:  01/03/2006 FINDINGS: The heart size and mediastinal contours are within normal limits. Both lungs are clear. The visualized skeletal structures are unremarkable. IMPRESSION: No active cardiopulmonary disease. Electronically Signed   By: Kathreen Devoid   On: 02/21/2017 17:39   Dg Foot Complete Left  Result Date: 02/21/2017 CLINICAL DATA:  First metatarsal pain and swelling beginning February 18, 2017. EXAM: LEFT FOOT - COMPLETE 3+ VIEW COMPARISON:  None. FINDINGS: There is no evidence of fracture or dislocation. Mild hallux valgus. There is no evidence of arthropathy or other focal bone abnormality. Mid and forefoot soft tissue swelling without subcutaneous gas radiopaque foreign bodies. Mild vascular calcifications. IMPRESSION: Soft tissue swelling without acute osseous process. Electronically Signed    By: Elon Alas M.D.   On: 02/21/2017 22:35    Procedures Procedures (including critical care time)  Medications Ordered in ED Medications - No data to display   Initial Impression / Assessment and Plan / ED Course  I have reviewed the triage vital signs and the nursing notes.  Pertinent labs & imaging results that were available during my care of the patient were reviewed by me and considered in my medical decision making (see chart for details).     Patient with pain and swelling over left first MTP joint. I think this is likely gout. He has had similar episode in the past on the right MTP joint. Will treat with  colchicine and 20 mg of prednisone. We'll also give some pain medicines. Will follow-up with his podiatrist. Has a history of CHF will need to watch his fluid status on the steroids.  Final Clinical Impressions(s) / ED Diagnoses   Final diagnoses:  Acute gout of left foot, unspecified cause    New Prescriptions Discharge Medication List as of 02/21/2017 11:01 PM    START taking these medications   Details  colchicine 0.6 MG tablet Take 1 tablet (0.6 mg total) by mouth daily., Starting Thu 02/21/2017, Print    oxyCODONE-acetaminophen (PERCOCET/ROXICET) 5-325 MG tablet Take 1-2 tablets by mouth every 6 (six) hours as needed for severe pain., Starting Thu 02/21/2017, Print    predniSONE (DELTASONE) 20 MG tablet Take 1 tablet (20 mg total) by mouth daily., Starting Thu 02/21/2017, Print         Davonna Belling, MD 02/21/17 [id number]

## 2017-02-27 ENCOUNTER — Ambulatory Visit
Admission: RE | Admit: 2017-02-27 | Discharge: 2017-02-27 | Disposition: A | Discharge status: Home / Self Care | Payer: Medicare Other | Source: Ambulatory Visit | Attending: Cardiology | Admitting: Cardiology

## 2017-02-27 DIAGNOSIS — I872 Venous insufficiency (chronic) (peripheral): Secondary | ICD-10-CM

## 2017-03-15 ENCOUNTER — Other Ambulatory Visit (HOSPITAL_COMMUNITY): Payer: Self-pay | Admitting: Respiratory Therapy

## 2017-03-15 DIAGNOSIS — R0600 Dyspnea, unspecified: Secondary | ICD-10-CM

## 2017-03-15 DIAGNOSIS — R0609 Other forms of dyspnea: Principal | ICD-10-CM

## 2017-03-20 ENCOUNTER — Encounter: Payer: Self-pay | Admitting: Internal Medicine

## 2017-03-20 ENCOUNTER — Other Ambulatory Visit (INDEPENDENT_AMBULATORY_CARE_PROVIDER_SITE_OTHER): Payer: Medicare Other

## 2017-03-20 ENCOUNTER — Ambulatory Visit (INDEPENDENT_AMBULATORY_CARE_PROVIDER_SITE_OTHER): Payer: Medicare Other | Admitting: Internal Medicine

## 2017-03-20 ENCOUNTER — Encounter: Payer: Self-pay | Admitting: Gastroenterology

## 2017-03-20 VITALS — BP 130/80 | HR 49 | Ht 76.0 in | Wt 246.6 lb

## 2017-03-20 DIAGNOSIS — R0600 Dyspnea, unspecified: Secondary | ICD-10-CM

## 2017-03-20 DIAGNOSIS — Z9989 Dependence on other enabling machines and devices: Secondary | ICD-10-CM | POA: Diagnosis not present

## 2017-03-20 DIAGNOSIS — G4733 Obstructive sleep apnea (adult) (pediatric): Secondary | ICD-10-CM | POA: Insufficient documentation

## 2017-03-20 DIAGNOSIS — R0609 Other forms of dyspnea: Secondary | ICD-10-CM

## 2017-03-20 LAB — HEPATIC FUNCTION PANEL
ALT: 20 U/L (ref 0–53)
AST: 17 U/L (ref 0–37)
Albumin: 4.1 g/dL (ref 3.5–5.2)
Alkaline Phosphatase: 60 U/L (ref 39–117)
Bilirubin, Direct: 0.1 mg/dL (ref 0.0–0.3)
Total Bilirubin: 0.5 mg/dL (ref 0.2–1.2)
Total Protein: 7.2 g/dL (ref 6.0–8.3)

## 2017-03-20 LAB — CBC WITH DIFFERENTIAL/PLATELET
Basophils Absolute: 0 10*3/uL (ref 0.0–0.1)
Basophils Relative: 0.8 % (ref 0.0–3.0)
Eosinophils Absolute: 0.1 10*3/uL (ref 0.0–0.7)
Eosinophils Relative: 3.6 % (ref 0.0–5.0)
HCT: 39 % (ref 39.0–52.0)
Hemoglobin: 12.8 g/dL — ABNORMAL LOW (ref 13.0–17.0)
Lymphocytes Relative: 44.2 % (ref 12.0–46.0)
Lymphs Abs: 1.8 10*3/uL (ref 0.7–4.0)
MCHC: 32.9 g/dL (ref 30.0–36.0)
MCV: 95.1 fl (ref 78.0–100.0)
Monocytes Absolute: 0.3 10*3/uL (ref 0.1–1.0)
Monocytes Relative: 8.3 % (ref 3.0–12.0)
Neutro Abs: 1.8 10*3/uL (ref 1.4–7.7)
Neutrophils Relative %: 43.1 % (ref 43.0–77.0)
Platelets: 181 10*3/uL (ref 150.0–400.0)
RBC: 4.1 Mil/uL — ABNORMAL LOW (ref 4.22–5.81)
RDW: 14.7 % (ref 11.5–15.5)
WBC: 4.1 10*3/uL (ref 4.0–10.5)

## 2017-03-20 LAB — BRAIN NATRIURETIC PEPTIDE: Pro B Natriuretic peptide (BNP): 77 pg/mL (ref 0.0–100.0)

## 2017-03-20 LAB — TSH: TSH: 1.18 u[IU]/mL (ref 0.35–4.50)

## 2017-03-20 MED ORDER — METOPROLOL SUCCINATE ER 50 MG PO TB24: ORAL_TABLET | ORAL | Status: DC

## 2017-03-20 MED ORDER — PANTOPRAZOLE SODIUM 40 MG PO TBEC: 40.0000 mg | DELAYED_RELEASE_TABLET | Freq: Every day | ORAL | 2 refills | Status: DC

## 2017-03-20 MED ORDER — FAMOTIDINE 20 MG PO TABS: ORAL_TABLET | ORAL | 2 refills | Status: DC

## 2017-03-20 NOTE — Assessment & Plan Note (Addendum)
03/20/2017  Walked RA x 3 laps @ 185 ft each stopped due to  End of study, nl pace, no sob or desat    - Spirometry 03/20/2017  FEV1 2.21 (62%)  Ratio 65 p lopressor 50 mg in am    Symptoms are markedly disproportionate to objective findings and not clear this is a lung problem but pt does appear to have difficult airway management issues. DDX of  difficult airways management almost all start with A and  include Adherence, Ace Inhibitors, Acid Reflux, Active Sinus Disease, Alpha 1 Antitripsin deficiency, Anxiety masquerading as Airways dz,  ABPA,  Allergy(esp in young), Aspiration (esp in elderly), Adverse effects of meds,  Active smokers, A bunch of PE's (a small clot burden can't cause this syndrome unless there is already severe underlying pulm or vascular dz with poor reserve) plus two Bs  = Bronchiectasis and Beta blocker use..and one C= CHF   Adherence is always the initial "prime suspect" and is a multilayered concern that requires a "trust but verify" approach in every patient - starting with knowing how to use medications, especially inhalers, correctly, keeping up with refills and understanding the fundamental difference between maintenance and prns vs those medications only taken for a very short course and then stopped and not refilled. - since seeing multiple docs not on epic rec return with all meds in hand using a trust but verify approach to confirm accurate Medication  Reconciliation The principal here is that until we are certain that the  patients are doing what we've asked, it makes no sense to ask them to do more.   ? Acid (or non-acid) GERD > always difficult to exclude as up to 75% of pts in some series report no assoc GI/ Heartburn symptoms and he def has solid food dysphagia> rec max (24h)  acid suppression and diet restrictions/ reviewed and instructions given in writing.   ? Active sinus dz > consider sinus ct next   ? Allergy/ asthma > suggested by spirometry but not hx > check  allergy profile   ? Anxiety/depression/ deconditioning  > usually at the bottom of this list of usual suspects but should be much higher on this pt's based on H and P since having symptoms at rest not reproduced with exertion here in office  ? A bunch of PE's > d dimer worrisome so will need Cta/ venous doppler as high risk due to obesity   ? BB > try change the lopressor to one half twice daily and repeat pfts and if still shows obst then Strongly prefer in this setting: Bystolic, the most beta -1  selective Beta blocker available in sample form, with bisoprolol the most selective generic choice  on the market.   ? CHF/ diastolic dysfunction > BNP ok but    ? Could he be getting PH from OSA?    Total time devoted to counseling  > 50 % of initial 60 min office visit:  review case with pt/wife Stanton Kidney and  discussion of options/alternatives/ personally creating written customized instructions  in presence of pt  then going over those specific  Instructions directly with the pt including how to use all of the meds but in particular covering each new medication in detail and the difference between the maintenance= "automatic" meds and the prns using an action plan format for the latter (If this problem/symptom => do that organization reading Left to right).  Please see AVS from this visit for a full list of  these instructions which I personally wrote for this pt and  are unique to this visit.

## 2017-03-20 NOTE — Progress Notes (Signed)
Subjective:    Patient ID: Martin Stephenson, male    DOB: 1941/07/23,   MRN: [id number]  HPI   78 yobm never smoker s/p sinus surgery remotely by [facility] but still intermittent nasty smell/spont drain watery material if bend head over  goes away s rx maybe qomonth with good ex tol but noted more difficulty with yardwork x 2016 and progressed since then to point where sometime sob at rest if using voice like praying or singing so referred to pulmonary clinic 03/20/2017 by Dr   Osborne Casco p neg cardiac w/u which was neg per Nadyne Coombes    03/20/2017 1st [facility] office visit/ Dixie Jafri   Chief Complaint  Patient presents with  . Pulmonary Consult    referred by Dr. Osborne Casco for SOB with exertion and when sitting still, denies cough, been going on since the last year  took pred x 3 weeks s impact  Doe MMRC2 = can't walk a nl pace on a flat grade s sob but does fine slow and flat eg shopping  Assoc dysphagia with solids > food sticks  about mid sternum  Wt gained about 15 lb since onset  Sleep study years ago total of 3 of them >  Mask leak doesn't use at all now  No obvious day to day or daytime variability or assoc excess/ purulent sputum or mucus plugs or hemoptysis or cp or chest tightness, subjective wheeze or overt sinus or hb symptoms. No unusual exp hx or h/o childhood pna/ asthma or knowledge of premature birth.  He thinks he's sleeping ok without nocturnal  or early am exacerbation  of respiratory  c/o's or need for noct saba but his wife describes snoring and apneas and he wakes up feeling unrested.   Also denies any obvious fluctuation of symptoms with weather or environmental changes or other aggravating or alleviating factors except as outlined above   Current Medications, Allergies, Complete Past Medical History, Past Surgical History, Family History, and Social History were reviewed in [employer] record.     Review of Systems  Constitutional: Negative for  fever and unexpected weight change.  HENT: Positive for sneezing and trouble swallowing. Negative for congestion, dental problem, ear pain, nosebleeds, postnasal drip, rhinorrhea, sinus pressure and sore throat.   Eyes: Negative for redness and itching.  Respiratory: Positive for shortness of breath. Negative for cough, chest tightness and wheezing.   Cardiovascular: Negative for palpitations and leg swelling.  Gastrointestinal: Negative for nausea and vomiting.  Genitourinary: Negative for dysuria.  Musculoskeletal: Negative for joint swelling.  Skin: Negative for rash.  Neurological: Negative for headaches.  Hematological: Does not bruise/bleed easily.  Psychiatric/Behavioral: Negative for dysphoric mood. The patient is not nervous/anxious.        Objective:   Physical Exam  amb somber bm nad   Wt Readings from Last 3 Encounters:  03/20/17 246 lb 9.6 oz (111.9 kg)  02/27/17 237 lb (107.5 kg)  02/21/17 240 lb (108.9 kg)    Vital signs reviewed  - Note on arrival 02 sats  98% on RA     HEENT: nl  turbinates bilaterally, and oropharynx. Nl external ear canals without cough reflex - moderate bilateral non-specific turbinate edema  And edentulous    NECK :  without JVD/Nodes/TM/ nl carotid upstrokes bilaterally   LUNGS: no acc muscle use,  Nl contour chest which is clear to A and P bilaterally without cough on insp or exp maneuvers   CV:  RRR  no s3 or murmur or increase in P2, and 1+ ptting both ankles sym edema   ABD:  soft and nontender with nl inspiratory excursion in the supine position. No bruits or organomegaly appreciated, bowel sounds nl  MS:  Nl gait/ ext warm without deformities, calf tenderness, cyanosis or clubbing No obvious joint restrictions   SKIN: warm and dry without lesions    NEURO:  alert, approp, nl sensorium with  no motor or cerebellar deficits apparent.      I personally reviewed images and agree with radiology impression as follows:  CXR:    02/21/17 No active cardiopulmonary disease.   Labs ordered/ reviewed:      Chemistry      Component Value Date/Time   NA 139 02/21/2017 1716   K 4.1 02/21/2017 1716   CL 105 02/21/2017 1716   CO2 26 02/21/2017 1716   BUN 16 02/21/2017 1716   CREATININE 1.46 (H) 02/21/2017 1716      Component Value Date/Time   CALCIUM 9.5 02/21/2017 1716   ALKPHOS 60 03/20/2017 0957   AST 17 03/20/2017 0957   ALT 20 03/20/2017 0957   BILITOT 0.5 03/20/2017 0957        Lab Results  Component Value Date   WBC 4.1 03/20/2017   HGB 12.8 (L) 03/20/2017   HCT 39.0 03/20/2017   MCV 95.1 03/20/2017   PLT 181.0 03/20/2017     Lab Results  Component Value Date   DDIMER 2.18 (H) 03/20/2017      Lab Results  Component Value Date   TSH 1.18 03/20/2017     Lab Results  Component Value Date   PROBNP 77.0 03/20/2017      No results found for: [place] ordered 03/20/2017  Allergy profile       Assessment & Plan:

## 2017-03-20 NOTE — Assessment & Plan Note (Signed)
Not using as of 03/20/2017   Directed [facility] to [facility]ap provider ([facility] ) and refer to sleep medicine here if can't work out the kinks there

## 2017-03-20 NOTE — Patient Instructions (Addendum)
Pantoprazole (protonix) 40 mg   Take  30-60 min before first meal of the day and Pepcid (famotidine)  20 mg one @  bedtime until return to office - this is the best way to tell whether stomach acid is contributing to your problem.    Change your lopressor to just take one half twice daily   GERD (REFLUX)  is an extremely common cause of respiratory symptoms just like yours , many times with no obvious heartburn at all.    It can be treated with medication, but also with lifestyle changes including elevation of the head of your bed (ideally with 6 inch  bed blocks),  Smoking cessation, avoidance of late meals, excessive alcohol, and avoid fatty foods, chocolate, peppermint, colas, red wine, and acidic juices such as orange juice.  NO MINT OR MENTHOL PRODUCTS SO NO COUGH DROPS   USE SUGARLESS CANDY INSTEAD (Jolley ranchers or Stover's or Life Savers) or even ice chips will also do - the key is to swallow to prevent all throat clearing. NO OIL BASED VITAMINS - use powdered substitutes.    Discuss your cpap problems with the provider but wear it as much as you can  Please schedule a follow up office visit in 6 weeks, call sooner if needed with PFTs on return with all medications  in hand so we can verify exactly what you are taking. This includes all medications from all doctors and over the counters

## 2017-03-21 ENCOUNTER — Institutional Professional Consult (permissible substitution): Payer: Medicare Other | Admitting: Internal Medicine

## 2017-03-21 ENCOUNTER — Telehealth: Payer: Self-pay | Admitting: Internal Medicine

## 2017-03-21 DIAGNOSIS — R0600 Dyspnea, unspecified: Secondary | ICD-10-CM

## 2017-03-21 DIAGNOSIS — R0609 Other forms of dyspnea: Principal | ICD-10-CM

## 2017-03-21 LAB — D-DIMER, QUANTITATIVE: D-Dimer, Quant: 2.18 mcg/mL FEU — ABNORMAL HIGH (ref <0.50)

## 2017-03-21 NOTE — Telephone Encounter (Signed)
Please advise on labs, thanks.

## 2017-03-21 NOTE — Telephone Encounter (Signed)
Let him know the d dimer suggests possible blood clots so needs CTa chest and venous dopplers to complete the w/u though my suspicion is low this is potentially very serious so will try to get them done by the w/e

## 2017-03-21 NOTE — Assessment & Plan Note (Signed)
Body mass index is 30.02 kg/m.    Lab Results  Component Value Date   TSH 1.18 03/20/2017     Contributing to gerd risk/ doe/reviewed the need and the process to achieve and maintain neg calorie balance > defer f/u primary care including intermittently monitoring thyroid status

## 2017-03-21 NOTE — Telephone Encounter (Signed)
Spoke with the spouse regarding results and she verbalized understanding  Pt is out of town at this time, and she thinks he is going to return tonight after 7 pm  I advised the importance of completing these tests ASAP  She states he will be available to do the tests anytime after he returns home today  I have ordered these STAT

## 2017-03-22 ENCOUNTER — Ambulatory Visit (HOSPITAL_COMMUNITY)
Admission: RE | Admit: 2017-03-22 | Discharge: 2017-03-22 | Disposition: A | Discharge status: Home / Self Care | Payer: Medicare Other | Source: Ambulatory Visit | Attending: Internal Medicine | Admitting: Internal Medicine

## 2017-03-22 ENCOUNTER — Other Ambulatory Visit: Payer: Self-pay | Admitting: Internal Medicine

## 2017-03-22 ENCOUNTER — Ambulatory Visit (INDEPENDENT_AMBULATORY_CARE_PROVIDER_SITE_OTHER)
Admission: RE | Admit: 2017-03-22 | Discharge: 2017-03-22 | Disposition: A | Discharge status: Home / Self Care | Payer: Medicare Other | Source: Ambulatory Visit | Attending: Internal Medicine | Admitting: Internal Medicine

## 2017-03-22 DIAGNOSIS — R7989 Other specified abnormal findings of blood chemistry: Secondary | ICD-10-CM

## 2017-03-22 DIAGNOSIS — R0609 Other forms of dyspnea: Secondary | ICD-10-CM | POA: Diagnosis not present

## 2017-03-22 DIAGNOSIS — R0600 Dyspnea, unspecified: Secondary | ICD-10-CM

## 2017-03-22 LAB — RESPIRATORY ALLERGY PROFILE REGION II ~~LOC~~
Allergen, A. alternata, m6: <0.1 kU/L
Allergen, C. Herbarum, M2: <0.1 kU/L
Allergen, Cedar tree, t12: <0.1 kU/L
Allergen, Comm Silver Birch, t9: <0.1 kU/L
Allergen, Cottonwood, t14: <0.1 kU/L
Allergen, D pternoyssinus,d7: <0.1 kU/L
Allergen, Mouse Urine Protein, e78: <0.1 kU/L
Allergen, Mulberry, t76: <0.1 kU/L
Allergen, Oak,t7: <0.1 kU/L
Allergen, P. notatum, m1: <0.1 kU/L
Aspergillus fumigatus, m3: <0.1 kU/L
Bermuda Grass: <0.1 kU/L
Box Elder IgE: <0.1 kU/L
Cat Dander: <0.1 kU/L
Cockroach: <0.1 kU/L
Common Ragweed: <0.1 kU/L
D. farinae: <0.1 kU/L
Dog Dander: <0.1 kU/L
Elm IgE: <0.1 kU/L
IgE (Immunoglobulin E), Serum: 63 kU/L (ref <115)
Johnson Grass: <0.1 kU/L
Pecan/Hickory Tree IgE: <0.1 kU/L
Rough Pigweed  IgE: <0.1 kU/L
Sheep Sorrel IgE: <0.1 kU/L
Timothy Grass: <0.1 kU/L

## 2017-03-22 MED ORDER — IOPAMIDOL (ISOVUE-370) INJECTION 76%
100.0000 mL | Freq: Once | INTRAVENOUS | Status: DC | PRN
Start: 2017-03-22 — End: 2017-03-23

## 2017-03-22 NOTE — Progress Notes (Signed)
*  PRELIMINARY RESULTS* Vascular Ultrasound Bilateral lower extremity venous duplex has been completed.  Preliminary findings: No evidence of deep vein thrombosis or baker's cysts bilaterally.  Pre order note, if patient is negative it is okay to let him leave.   Everrett Coombe 03/22/2017, 12:36 PM

## 2017-03-28 ENCOUNTER — Telehealth: Payer: Self-pay | Admitting: Internal Medicine

## 2017-03-28 NOTE — Progress Notes (Signed)
LMTCB

## 2017-03-28 NOTE — Telephone Encounter (Signed)
Notes recorded by Tanda Rockers, MD on 03/28/2017 at 7:25 AM EDT Call patient : Study is unremarkable, no change in recs ------------- Spoke with pt, aware of results/recs.  Nothing further needed.

## 2017-04-15 ENCOUNTER — Encounter: Payer: Self-pay | Admitting: Gastroenterology

## 2017-04-26 ENCOUNTER — Other Ambulatory Visit: Payer: Self-pay | Admitting: Internal Medicine

## 2017-04-26 DIAGNOSIS — R0609 Other forms of dyspnea: Principal | ICD-10-CM

## 2017-04-26 DIAGNOSIS — R0600 Dyspnea, unspecified: Secondary | ICD-10-CM

## 2017-04-26 MED ORDER — PANTOPRAZOLE SODIUM 40 MG PO TBEC: 40.0000 mg | DELAYED_RELEASE_TABLET | Freq: Every day | ORAL | 0 refills | Status: DC

## 2017-05-01 ENCOUNTER — Other Ambulatory Visit: Payer: Self-pay | Admitting: Internal Medicine

## 2017-05-01 DIAGNOSIS — R0600 Dyspnea, unspecified: Secondary | ICD-10-CM

## 2017-05-01 DIAGNOSIS — R0609 Other forms of dyspnea: Principal | ICD-10-CM

## 2017-05-01 MED ORDER — FAMOTIDINE 20 MG PO TABS: ORAL_TABLET | ORAL | 0 refills | Status: DC

## 2017-05-06 ENCOUNTER — Encounter: Payer: Self-pay | Admitting: Adult Health

## 2017-05-06 ENCOUNTER — Ambulatory Visit (INDEPENDENT_AMBULATORY_CARE_PROVIDER_SITE_OTHER): Payer: Medicare Other | Admitting: Internal Medicine

## 2017-05-06 ENCOUNTER — Ambulatory Visit (INDEPENDENT_AMBULATORY_CARE_PROVIDER_SITE_OTHER): Payer: Medicare Other | Admitting: Adult Health

## 2017-05-06 DIAGNOSIS — G4733 Obstructive sleep apnea (adult) (pediatric): Secondary | ICD-10-CM | POA: Diagnosis not present

## 2017-05-06 DIAGNOSIS — R0600 Dyspnea, unspecified: Secondary | ICD-10-CM

## 2017-05-06 DIAGNOSIS — Z9989 Dependence on other enabling machines and devices: Secondary | ICD-10-CM

## 2017-05-06 DIAGNOSIS — R0609 Other forms of dyspnea: Secondary | ICD-10-CM

## 2017-05-06 NOTE — Progress Notes (Signed)
PFT done today. 

## 2017-05-06 NOTE — Assessment & Plan Note (Signed)
?   Etiology workup thus far unrevealing  ? Diastolic dysfunction with LE swelling . Also uncontrolled OSA ? [place] /DCHF may be contrubiting factor .  Will check to see if recent echo with cards   Plan  Patient Instructions  Restart CPAP At bedtime  .  Need to wear each night for at least 4-6hrs.  Get Echo results.from Dr. Einar Gip.  May stop the pepcid and protonix .  Take lasix As needed  For leg swelling l.  Follow up Dr. Melvyn Novas  In 6 weeks and. As needed   Please contact office for sooner follow up if symptoms do not improve or worsen or seek emergency care

## 2017-05-06 NOTE — Assessment & Plan Note (Signed)
Not controlled  Encouraged on compliance   Plan  Restart CPAP At bedtime  .  Wt loss.

## 2017-05-06 NOTE — Progress Notes (Signed)
@Patient  ID: Martin Stephenson, male    DOB: 29-Sep-1941, 76 y.o.   MRN: [id number]  Chief Complaint  Patient presents with  . Follow-up    Dyspnea /PFT     Referring provider: Haywood Pao, MD  HPI: 76 year old male never smoker seen for pulmonary consult 03/20/2017 for shortness of breath  TEST  CXR 02/2017 NAD Walk test 03/20/2017 with no desaturations Spirometry 03/20/2017 FEV1 62%, ratio 65 Pulmonary function test 05/06/2017 FEV1 82%, ratio 74, FVC 84%, no significant bronchodilator response, positive reversibility in the mid flows. DLCO 48% CT chest 03/22/2017. Negative for pulmonary embolism. Compressive atelectasis in the lower lobes Venous Doppler 02/27/2017. Negative for DVT Allergy profile 03/20/2017 . Negative, IgE 63, eosinophils normal  05/06/2017 Follow up : Dyspnea  Patient returns for a two-month follow-up. Patient was seen last visit on 03/20/2017 for primary consult for dyspnea. He complained of 2-3 years of progressive DOE. Says he remains active at home. He does yardwork and cuts wood but gets winded and has to rest. Has mild dry cough on/off.  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   Patient underwent an extensive workup with a CT chest that was negative for pulmonary embolus . walk test during the office visit was negative for desaturations on room air. Venous Doppler testing was negative for DVT. Allergy profile was negative. IgE was 63. CBC showed normal sinus feels. Pulmonary function test today showed no significant airflow obstruction or restriction. FEV1 was 82%, ratio 74, FVC 84%. No significant bronchodilator response. Mid flows did show moderate obstruction with positive reversibility. DLCO was decreased at 48%.  Patient was recommended to begin Protonix for possible upper airway irritation due to reflux. Was recommended change Lopressor to one half tablet twice daily. Since last visit. Patient is feeling no better. Has not noticed any change in DOE.    Is followed by Cardiology Dr. Einar Gip . Says cardiac stress last year reported as okay  He is followed by ENT for chronic sinusitis .  Has chronic LE swelling . BNP in 02/2017  . Has lasix but does not take it often. ? Echo in past.  Has OSA on CPAP - but admits not wearing it .  Just got new mask but says it dries him out . We discussed compliance and complications of untreated OSA>     Allergies  Allergen Reactions  . Aleve [Naproxen Sodium]     Patient has been instructed to avoid Ibuprofen b/c of his Creatinine level.  . Ibuprofen Other (See Comments)    Patient has been instructed to avoid Ibuprofen b/c of his Creatinine level.  . Furacin [Nitrofurazone] Rash    Immunization History  Administered Date(s) Administered  . Influenza, High Dose Seasonal PF 09/23/2016    Past Medical History:  Diagnosis Date  . Anemia   . Arthritis   . Congestive heart failure ([place])   . Hiatal hernia   . Hyperlipidemia   . Hypertension   . Kidney stone   . Sleep apnea     Tobacco History: History  Smoking Status  . Never Smoker  Smokeless Tobacco  . Never Used   Counseling given: Not Answered   Outpatient Encounter Prescriptions as of 05/06/2017  Medication Sig  . allopurinol (ZYLOPRIM) 300 MG tablet Take 300 mg by mouth daily.   Marland Kitchen aspirin 81 MG tablet Take 81 mg by mouth daily.  . Calcium Carb-Cholecalciferol (CALCIUM 600/VITAMIN D3 PO) Take 1 tablet by mouth daily.   . colchicine  0.6 MG tablet Take 1 tablet (0.6 mg total) by mouth daily.  . famotidine (PEPCID) 20 MG tablet One at bedtime  . furosemide (LASIX) 40 MG tablet Take 40 mg by mouth See admin instructions. Only takes when needed, but takes for 3 days in a row when needed for fluid  . losartan (COZAAR) 25 MG tablet Take 25 mg by mouth every evening.   . metoprolol succinate (TOPROL-XL) 50 MG 24 hr tablet Take one half twice daily  . oxyCODONE-acetaminophen (PERCOCET/ROXICET) 5-325 MG tablet Take 1-2 tablets by mouth  every 6 (six) hours as needed for severe pain.  . pantoprazole (PROTONIX) 40 MG tablet Take 1 tablet (40 mg total) by mouth daily. Take 30-60 min before first meal of the day  . rosuvastatin (CRESTOR) 10 MG tablet Take 10 mg by mouth every Monday, Wednesday, and Friday.   No facility-administered encounter medications on file as of 05/06/2017.      Review of Systems  Constitutional:   No  weight loss, night sweats,  Fevers, chills,  +fatigue, or  lassitude.  HEENT:   No headaches,  Difficulty swallowing,  Tooth/dental problems, or  Sore throat,                No sneezing, itching, ear ache, + nasal congestion, post nasal drip,   CV:  No chest pain,  Orthopnea, PND, swelling in lower extremities, anasarca, dizziness, palpitations, syncope.   GI  No heartburn, indigestion, abdominal pain, nausea, vomiting, diarrhea, change in bowel habits, loss of appetite, bloody stools.   Resp:  No excess mucus, no productive cough,  No non-productive cough,  No coughing up of blood.  No change in color of mucus.  No wheezing.  No chest wall deformity  Skin: no rash or lesions.  GU: no dysuria, change in color of urine, no urgency or frequency.  No flank pain, no hematuria   MS:  No joint pain or swelling.  No decreased range of motion.  No back pain.    Physical Exam  BP 134/74 (BP Location: Right Arm, Cuff Size: Normal)   Pulse (!) 58   Ht 6\' 3"  (1.905 m)   Wt 253 lb (114.8 kg)   SpO2 98%   BMI 31.62 kg/m   GEN: A/Ox3; pleasant , NAD, elderly    HEENT:  [place]/AT,  EACs-clear, TMs-wnl, NOSE-clear drainage  THROAT-clear, no lesions, no postnasal drip or exudate noted.   NECK:  Supple w/ fair ROM; no JVD; normal carotid impulses w/o bruits; no thyromegaly or nodules palpated; no lymphadenopathy.    RESP  Clear  P & A; w/o, wheezes/ rales/ or rhonchi. no accessory muscle use, no dullness to percussion  CARD:  RRR, no m/r/g, 1 to 2 peripheral edema, pulses intact, no cyanosis or  clubbing.  GI:   Soft & nt; nml bowel sounds; no organomegaly or masses detected.   Musco: Warm bil, no deformities or joint swelling noted.   Neuro: alert, no focal deficits noted.    Skin: Warm, no lesions or rashes    Lab Results:  CBC  BNP No results found for: BNP  ProBNP  Imaging: No results found.   Assessment & Plan:   OSA on CPAP Not controlled  Encouraged on compliance   Plan  Restart CPAP At bedtime  .  Wt loss.    Dyspnea on exertion ? Etiology workup thus far unrevealing  ? Diastolic dysfunction with LE swelling . Also uncontrolled OSA ? [place] /DCHF may be  contrubiting factor .  Will check to see if recent echo with cards   Plan  Patient Instructions  Restart CPAP At bedtime  .  Need to wear each night for at least 4-6hrs.  Get Echo results.from Dr. Einar Gip.  May stop the pepcid and protonix .  Take lasix As needed  For leg swelling l.  Follow up Dr. Melvyn Novas  In 6 weeks and. As needed   Please contact office for sooner follow up if symptoms do not improve or worsen or seek emergency care         Rexene Edison, NP 05/06/2017

## 2017-05-06 NOTE — Patient Instructions (Addendum)
Restart CPAP At bedtime  .  Need to wear each night for at least 4-6hrs.  Get Echo results.from Dr. Einar Gip.  May stop the pepcid and protonix .  Take lasix As needed  For leg swelling l.  Follow up Dr. Melvyn Novas  In 6 weeks and. As needed   Please contact office for sooner follow up if symptoms do not improve or worsen or seek emergency care

## 2017-05-07 LAB — PULMONARY FUNCTION TEST
DL/VA % pred: 70 %
DL/VA: 3.44 ml/min/mmHg/L
DLCO cor % pred: 49 %
DLCO cor: 19.45 ml/min/mmHg
DLCO unc % pred: 48 %
DLCO unc: 19.06 ml/min/mmHg
FEF 25-75 Post: 2.51 L/sec
FEF 25-75 Pre: 1.79 L/sec
FEF2575-%Change-Post: 40 %
FEF2575-%Pred-Post: 89 %
FEF2575-%Pred-Pre: 63 %
FEV1-%Change-Post: 8 %
FEV1-%Pred-Post: 82 %
FEV1-%Pred-Pre: 76 %
FEV1-Post: 2.84 L
FEV1-Pre: 2.62 L
FEV1FVC-%Change-Post: 2 %
FEV1FVC-%Pred-Pre: 94 %
FEV6-%Change-Post: 4 %
FEV6-%Pred-Post: 86 %
FEV6-%Pred-Pre: 82 %
FEV6-Post: 3.74 L
FEV6-Pre: 3.57 L
FEV6FVC-%Change-Post: 0 %
FEV6FVC-%Pred-Post: 101 %
FEV6FVC-%Pred-Pre: 102 %
FVC-%Change-Post: 5 %
FVC-%Pred-Post: 84 %
FVC-%Pred-Pre: 80 %
FVC-Post: 3.84 L
FVC-Pre: 3.64 L
Post FEV1/FVC ratio: 74 %
Post FEV6/FVC ratio: 97 %
Pre FEV1/FVC ratio: 72 %
Pre FEV6/FVC Ratio: 98 %
RV % pred: 48 %
RV: 1.36 L
TLC % pred: 66 %
TLC: 5.34 L

## 2017-05-07 NOTE — Progress Notes (Signed)
Chart and office note reviewed in detail  > agree with a/p as outlined    

## 2017-05-22 ENCOUNTER — Ambulatory Visit (AMBULATORY_SURGERY_CENTER): Payer: Self-pay | Admitting: *Deleted

## 2017-05-22 VITALS — Ht 76.0 in | Wt 251.0 lb

## 2017-05-22 DIAGNOSIS — Z86010 Personal history of colonic polyps: Secondary | ICD-10-CM

## 2017-05-22 MED ORDER — NA SULFATE-K SULFATE-MG SULF 17.5-3.13-1.6 GM/177ML PO SOLN: 1.0000 | Freq: Once | ORAL | 0 refills | Status: AC

## 2017-05-22 NOTE — Progress Notes (Signed)
No egg or soy allergy known to patient  No issues with past sedation with any surgeries  or procedures, no intubation problems  No diet pills per patient No home 02 use per patient  No blood thinners per patient  Pt denies issues with constipation  No A fib or A flutter  EMMI video sent to pt's e mail  Pt states he is a hard IV stick

## 2017-05-24 DIAGNOSIS — D126 Benign neoplasm of colon, unspecified: Secondary | ICD-10-CM

## 2017-05-24 HISTORY — DX: Benign neoplasm of colon, unspecified: D12.6

## 2017-05-27 ENCOUNTER — Encounter: Payer: Self-pay | Admitting: Gastroenterology

## 2017-06-05 ENCOUNTER — Encounter: Payer: Self-pay | Admitting: Gastroenterology

## 2017-06-05 ENCOUNTER — Ambulatory Visit (AMBULATORY_SURGERY_CENTER): Payer: Medicare Other | Admitting: Gastroenterology

## 2017-06-05 VITALS — BP 138/73 | HR 53 | Temp 97.1°F | Resp 16 | Ht 76.0 in | Wt 251.0 lb

## 2017-06-05 DIAGNOSIS — D123 Benign neoplasm of transverse colon: Secondary | ICD-10-CM

## 2017-06-05 DIAGNOSIS — Z86010 Personal history of colonic polyps: Secondary | ICD-10-CM | POA: Diagnosis not present

## 2017-06-05 DIAGNOSIS — Z800 Family history of malignant neoplasm of digestive organs: Secondary | ICD-10-CM | POA: Diagnosis not present

## 2017-06-05 MED ORDER — SODIUM CHLORIDE 0.9 % IV SOLN: 500.0000 mL | INTRAVENOUS | Status: DC

## 2017-06-05 NOTE — Progress Notes (Signed)
To recovery, report to Tyrell, RN, VSS. 

## 2017-06-05 NOTE — Patient Instructions (Signed)
Handoutsgiven for polyps, diverticulosis and Hemorrhoids.   YOU HAD AN ENDOSCOPIC PROCEDURE TODAY AT [facility]:   Refer to the procedure report that was given to you for any specific questions about what was found during the examination.  If the procedure report does not answer your questions, please call your gastroenterologist to clarify.  If you requested that your care partner not be given the details of your procedure findings, then the procedure report has been included in a sealed envelope for you to review at your convenience later.  YOU SHOULD EXPECT: Some feelings of bloating in the abdomen. Passage of more gas than usual.  Walking can help get rid of the air that was put into your GI tract during the procedure and reduce the bloating. If you had a lower endoscopy (such as a colonoscopy or flexible sigmoidoscopy) you may notice spotting of blood in your stool or on the toilet paper. If you underwent a bowel prep for your procedure, you may not have a normal bowel movement for a few days.  Please Note:  You might notice some irritation and congestion in your nose or some drainage.  This is from the oxygen used during your procedure.  There is no need for concern and it should clear up in a day or so.  SYMPTOMS TO REPORT IMMEDIATELY:   Following lower endoscopy (colonoscopy or flexible sigmoidoscopy):  Excessive amounts of blood in the stool  Significant tenderness or worsening of abdominal pains  Swelling of the abdomen that is new, acute  Fever of 100F or higher   For urgent or emergent issues, a gastroenterologist can be reached at any hour by calling [phone].   DIET:  We do recommend a small meal at first, but then you may proceed to your regular diet.  Drink plenty of fluids but you should avoid alcoholic beverages for 24 hours.  ACTIVITY:  You should plan to take it easy for the rest of today and you should NOT DRIVE or use heavy machinery until  tomorrow (because of the sedation medicines used during the test).    FOLLOW UP: Our staff will call the number listed on your records the next business day following your procedure to check on you and address any questions or concerns that you may have regarding the information given to you following your procedure. If we do not reach you, we will leave a message.  However, if you are feeling well and you are not experiencing any problems, there is no need to return our call.  We will assume that you have returned to your regular daily activities without incident.  If any biopsies were taken you will be contacted by phone or by letter within the next 1-3 weeks.  Please call us at [phone] if you have not heard about the biopsies in 3 weeks.    SIGNATURES/CONFIDENTIALITY: You and/or your care partner have signed paperwork which will be entered into your electronic medical record.  These signatures attest to the fact that that the information above on your After Visit Summary has been reviewed and is understood.  Full responsibility of the confidentiality of this discharge information lies with you and/or your care-partner.

## 2017-06-05 NOTE — Progress Notes (Signed)
Called to room to assist during endoscopic procedure.  Patient ID and intended procedure confirmed with present staff. Received instructions for my participation in the procedure from the performing physician.  

## 2017-06-05 NOTE — Op Note (Signed)
[place] Patient Name: Martin Stephenson Procedure Date: 06/05/2017 2:09 PM MRN: [id number] Endoscopist: Ladene Artist , MD Age: 76 Referring MD:  Date of Birth: September 01, 1941 Gender: Male Account #: [id number] Procedure:                Colonoscopy Indications:              Surveillance: Personal history of adenomatous                            polyps on last colonoscopy 5 years ago. Family                            history of colon cancer, mother. Medicines:                Monitored Anesthesia Care Procedure:                Pre-Anesthesia Assessment:                           - Prior to the procedure, a History and Physical                            was performed, and patient medications and                            allergies were reviewed. The patient's tolerance of                            previous anesthesia was also reviewed. The risks                            and benefits of the procedure and the sedation                            options and risks were discussed with the patient.                            All questions were answered, and informed consent                            was obtained. Prior Anticoagulants: The patient has                            taken no previous anticoagulant or antiplatelet                            agents. ASA Grade Assessment: II - A patient with                            mild systemic disease. After reviewing the risks                            and benefits, the patient was deemed in  satisfactory condition to undergo the procedure.                           After obtaining informed consent, the colonoscope                            was passed under direct vision. Throughout the                            procedure, the patient's blood pressure, pulse, and                            oxygen saturations were monitored continuously. The                            Colonoscope was introduced through  the anus and                            advanced to the the cecum, identified by                            appendiceal orifice and ileocecal valve. The                            ileocecal valve, appendiceal orifice, and rectum                            were photographed. The quality of the bowel                            preparation was good. The colonoscopy was performed                            without difficulty. The patient tolerated the                            procedure well. Scope In: 2:20:38 PM Scope Out: 2:31:40 PM Scope Withdrawal Time: 0 hours 8 minutes 18 seconds  Total Procedure Duration: 0 hours 11 minutes 2 seconds  Findings:                 The perianal and digital rectal examinations were                            normal.                           A 6 mm polyp was found in the transverse colon. The                            polyp was sessile. The polyp was removed with a                            cold snare. Resection and retrieval were complete.  A few medium-mouthed diverticula were found in the                            sigmoid colon.                           Internal hemorrhoids were found during                            retroflexion. The hemorrhoids were small and Grade                            I (internal hemorrhoids that do not prolapse).                           The exam was otherwise without abnormality on                            direct and retroflexion views. Complications:            No immediate complications. Estimated blood loss:                            None. Estimated Blood Loss:     Estimated blood loss: none. Impression:               - One 6 mm polyp in the transverse colon, removed                            with a cold snare. Resected and retrieved.                           - Diverticulosis in the sigmoid colon.                           - Internal hemorrhoids.                           - The  examination was otherwise normal on direct                            and retroflexion views. Recommendation:           - Repeat colonoscopy in 5 years for surveillance.                           - Patient has a contact number available for                            emergencies. The signs and symptoms of potential                            delayed complications were discussed with the                            patient. Return to normal activities tomorrow.  Written discharge instructions were provided to the                            patient.                           - Resume previous diet.                           - Continue present medications.                           - Await pathology results. Ladene Artist, MD 06/05/2017 2:35:40 PM This report has been signed electronically.

## 2017-06-06 ENCOUNTER — Telehealth: Payer: Self-pay | Admitting: *Deleted

## 2017-06-06 NOTE — Telephone Encounter (Signed)
  Follow up Call-  Call back number 06/05/2017  Post procedure Call Back phone  # [phone]  Permission to leave phone message Yes  Some recent data might be hidden     Patient questions:  Mailbox is full on cell phone.

## 2017-06-06 NOTE — Telephone Encounter (Signed)
  Follow up Call-  Call back number 06/05/2017  Post procedure Call Back phone  # [phone]  Permission to leave phone message Yes  Some recent data might be hidden     Patient questions:  Voice mail is still full. Second call.

## 2017-06-17 ENCOUNTER — Ambulatory Visit: Payer: Medicare Other | Admitting: Internal Medicine

## 2017-06-20 ENCOUNTER — Ambulatory Visit (INDEPENDENT_AMBULATORY_CARE_PROVIDER_SITE_OTHER): Payer: Medicare Other | Admitting: Orthopedic Surgery

## 2017-06-24 ENCOUNTER — Encounter (INDEPENDENT_AMBULATORY_CARE_PROVIDER_SITE_OTHER): Payer: Self-pay | Admitting: Orthopedic Surgery

## 2017-06-24 ENCOUNTER — Ambulatory Visit (INDEPENDENT_AMBULATORY_CARE_PROVIDER_SITE_OTHER): Payer: Medicare Other | Admitting: Orthopedic Surgery

## 2017-06-24 VITALS — Ht 76.0 in | Wt 251.0 lb

## 2017-06-24 DIAGNOSIS — M25571 Pain in right ankle and joints of right foot: Secondary | ICD-10-CM | POA: Diagnosis not present

## 2017-06-24 DIAGNOSIS — M25572 Pain in left ankle and joints of left foot: Secondary | ICD-10-CM | POA: Diagnosis not present

## 2017-06-24 DIAGNOSIS — G8929 Other chronic pain: Secondary | ICD-10-CM | POA: Diagnosis not present

## 2017-06-24 DIAGNOSIS — M172 Bilateral post-traumatic osteoarthritis of knee: Secondary | ICD-10-CM

## 2017-06-24 NOTE — Progress Notes (Signed)
Office Visit Note   Patient: Martin Stephenson           Date of Birth: 1941/10/27           MRN: [id number] Visit Date: 06/24/2017              Requested by: Haywood Pao, MD [id number] [place], [place] [address] PCP: Osborne Casco Fransico Him, MD  Chief Complaint  Patient presents with  . Right Ankle - Pain  . Left Ankle - Pain  . Left Knee - Pain  . Right Knee - Pain      HPI: Patient is a 76 year old gentleman who is seen for evaluation for both knees and both ankles. 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 Patient has been having chronic ankle pain since that time and bilateral knee pain. He is status post a right knee arthroscopy for debridement of the arthritis and he is status post right knee hyaluronic acid injections most recently by Dr. Jettie Pagan.  Patient also has chronic venous insufficiency of both legs is currently wearing 20-30 mm compression stockings.  Assessment & Plan: Visit Diagnoses:  1. Post-traumatic osteoarthritis of both knees   2. Chronic ankle pain, bilateral     Plan: Patient will continue to wear the 20-30 knee-high compression stockings and the importance of compliance was discussed. Recommended strengthening for both knees to help with the transfer of load across the arthritic knees. Discussed the patient most likely would require a total knee arthroplasty in the future. Recommend patient have a uric acid level checked with his next blood work to follow-up on the treatment for his gout. Patient has had multiple episodes of right great toe pain due to the gout.  It is more likely than not that patients osteoarthritis in both knees and both ankle pain was due to his service related injury.  Follow-Up Instructions: Return if symptoms worsen or fail to improve.   Ortho Exam  Patient is alert, oriented, no adenopathy, well-dressed, normal affect, normal respiratory effort. Examination  patient has a normal gait. He has crepitation with range of motion of both knees right knee worse than left. He is tender to palpation of the patellofemoral joint bilaterally causing increasing to stable bilaterally and he has minimal tenderness to palpation of the medial and lateral joint lines bilaterally. Patient does have giving way episodes of the left knee but no mechanical symptoms in either knee. Examination of his both legs he has venous insufficiency with brawny skin color changes and some pitting edema. Ankles are nontender to palpation at this time. He has no pain to palpation or with range of motion of the right great toe there is a surgical incision dorsally as well as incision over the third webspace. Patient has no redness no cellulitis no signs of infection in either lower extremity. There are no venous ulcers.  Imaging: No results found.  Labs: Lab Results  Component Value Date   REPTSTATUS 08/12/2016 FINAL 08/11/2016   CULT NO GROWTH 08/11/2016    Orders:  No orders of the defined types were placed in this encounter.  No orders of the defined types were placed in this encounter.    Procedures: No procedures performed  Clinical Data: No additional findings.  ROS:  All other systems negative, except as noted in the HPI. Review of Systems  Objective: Vital Signs: Ht 6\' 4"  (1.63 m)   Wt 251 lb (113.9 kg)  BMI 30.55 kg/m   Specialty Comments:  No specialty comments available.  PMFS History: Patient Active Problem List   Diagnosis Date Noted  . Morbid obesity due to excess calories ([place]) 03/21/2017  . Dyspnea on exertion 03/20/2017  . OSA on CPAP 03/20/2017   Past Medical History:  Diagnosis Date  . Allergy   . Anemia   . Arthritis    foot  . Congestive heart failure ([place])   . Constipation    uses mag citrate OTC if needed  . GERD (gastroesophageal reflux disease)   . Hiatal hernia   . Hyperlipidemia   . Hypertension   . Kidney stone   . Sleep  apnea    wears cpap    Family History  Problem Relation Age of Onset  . Colon cancer Mother   . Stomach cancer Neg Hx   . Colon polyps Neg Hx   . Esophageal cancer Neg Hx   . Rectal cancer Neg Hx     Past Surgical History:  Procedure Laterality Date  . COLONOSCOPY    . HAMMER TOE SURGERY  1998   1st toe right foot  . hydrocelectomy  1988  . POLYPECTOMY    . TONSILLECTOMY  1988   Social History   Occupational History  . Not on file.   Social History Main Topics  . Smoking status: Never Smoker  . Smokeless tobacco: Never Used  . Alcohol use No  . Drug use: No  . Sexual activity: Not on file

## 2017-06-30 ENCOUNTER — Encounter: Payer: Self-pay | Admitting: Gastroenterology

## 2017-07-12 ENCOUNTER — Ambulatory Visit (INDEPENDENT_AMBULATORY_CARE_PROVIDER_SITE_OTHER): Payer: Medicare Other | Admitting: Internal Medicine

## 2017-07-12 ENCOUNTER — Encounter: Payer: Self-pay | Admitting: Internal Medicine

## 2017-07-12 VITALS — BP 138/78 | HR 61 | Ht 76.0 in | Wt 245.6 lb

## 2017-07-12 DIAGNOSIS — G4733 Obstructive sleep apnea (adult) (pediatric): Secondary | ICD-10-CM

## 2017-07-12 DIAGNOSIS — R0609 Other forms of dyspnea: Secondary | ICD-10-CM | POA: Diagnosis not present

## 2017-07-12 DIAGNOSIS — R0600 Dyspnea, unspecified: Secondary | ICD-10-CM

## 2017-07-12 DIAGNOSIS — Z9989 Dependence on other enabling machines and devices: Secondary | ICD-10-CM | POA: Diagnosis not present

## 2017-07-12 NOTE — Progress Notes (Signed)
Subjective:    Patient ID: Martin Stephenson, male    DOB: 11-06-1941,   MRN: [id number]    Brief patient profile:  70 yobm never smoker s/p sinus surgery remotely by [facility] but still intermittent nasty smell/spont drain watery material if bend head over  goes away s rx maybe every other month  with good ex tol but noted more difficulty with yardwork x 2016 and progressed since then to point where sometime sob at rest if using voice like praying or singing so referred to pulmonary clinic 03/20/2017 by Dr   Osborne Casco p neg cardiac w/u which was neg per Nadyne Coombes    History of Present Illness  03/20/2017 1st [name] office visit/ Martin Stephenson   Chief Complaint  Patient presents with  . Pulmonary Consult    referred by Dr. Osborne Casco for SOB with exertion and when sitting still, denies cough, been going on since the last year  took pred x 3 weeks s impact  Doe MMRC2 = can't walk a nl pace on a flat grade s sob but does fine slow and flat eg shopping  Assoc dysphagia with solids > food sticks  about mid sternum  Wt gained about 15 lb since onset  Sleep study years ago total of 3 of them >  Mask leak doesn't use at all now rec Pantoprazole (protonix) 40 mg   Take  30-60 min before first meal of the day and Pepcid (famotidine)  20 mg one @  bedtime until return to office - this is the best way to tell whether stomach acid is contributing to your problem.   Change your lopressor to just take one half twice daily  GERD  Discuss your cpap problems with the provider but wear it as much as you can     07/12/2017  f/u ov/Karolyn Messing re: unexplained doe /fatigue Chief Complaint  Patient presents with  . Follow-up    Pt states that he started losing voice yesterday, 07/11/17 for no explained reason.  SOB on exertion is not as bad as it used to be. Denies any cough or CP.  main problems are fatigue > sob x 3-4 y Chronic leg swelling > Gangi > demadex/ edema no relation to sob  [facility], not satisfied    CR / sinusitis > ENT f/u   Did not feel gerd rx helped his hoarseness or sob   Doe = MMRC2 = can't walk a nl pace on a flat grade s sob but does fine slow and flat up to a half a mile then gives out - overall trend is gradually declining ex tol  x 3 y but denies sob at rest now and previously said this was the case and is now denies and told nurses he's better    No obvious day to day or daytime variability or assoc excess/ purulent sputum or mucus plugs or hemoptysis or cp or chest tightness, subjective wheeze or overt sinus or hb symptoms. No unusual exp hx or h/o childhood pna/ asthma or knowledge of premature birth.  Sleeping ok without nocturnal  or early am exacerbation  of respiratory  c/o's or need for noct saba. Also denies any obvious fluctuation of symptoms with weather or environmental changes or other aggravating or alleviating factors except as outlined above   Current Medications, Allergies, Complete Past Medical History, Past Surgical History, Family History, and Social History were reviewed in [employer] record.  ROS  The following are not  active complaints unless bolded sore throat, dysphagia, dental problems, itching, sneezing,  nasal congestion or excess/ purulent secretions, ear ache,   fever, chills, sweats, unintended wt loss, classically pleuritic or exertional cp,  orthopnea pnd or leg swelling, presyncope, palpitations, abdominal pain, anorexia, nausea, vomiting, diarrhea  or change in bowel or bladder habits, change in stools or urine, dysuria,hematuria,  rash, arthralgias, visual complaints, headache, numbness, weakness or ataxia or problems with walking or coordination,  change in mood/affect or memory.                      Objective:   Physical Exam  amb somber bm nad quite hoarse / evasive and inconsistent historian    07/12/2017       245    03/20/17 246 lb 9.6 oz (111.9 kg)  02/27/17 237 lb (107.5 kg)  02/21/17 240 lb (108.9  kg)    Vital signs reviewed  - Note on arrival 02 sats  95% on RA     HEENT: nl  turbinates bilaterally, and oropharynx. Nl external ear canals without cough reflex - moderate bilateral non-specific turbinate edema  And  Full upper/ lower partial     NECK :  without JVD/Nodes/TM/ nl carotid upstrokes bilaterally   LUNGS: no acc muscle use,  Nl contour chest which is clear to A and P bilaterally without cough on insp or exp maneuvers   CV:  RRR  no s3 or murmur or increase in P2, and 1+ pitting both ankles sym edema   ABD:  soft and nontender with nl inspiratory excursion in the supine position. No bruits or organomegaly appreciated, bowel sounds nl  MS:  Nl gait/ ext warm without deformities, calf tenderness, cyanosis or clubbing No obvious joint restrictions   SKIN: warm and dry without lesions    NEURO:  alert, approp, nl sensorium with  no motor or cerebellar deficits apparent.                      Assessment & Plan:

## 2017-07-12 NOTE — Assessment & Plan Note (Addendum)
03/20/2017  Walked RA x 3 laps @ 185 ft each stopped due to  End of study, nl pace, no sob or desat    - Spirometry 03/20/2017  FEV1 2.21 (62%)  Ratio 65 p lopressor 50 mg in am   - 03/22/2017  CTa neg PE > Compressive atelectasis dependent lower lobes. No focal airspace consolidation.   - Allergy profile 03/21/17  >  Eos 0.1 /  IgE  63 RAST neg  - 03/27/17 > neg venous dopplers  - PFT's  07/12/2017  FEV1 2.84 (82 % ) ratio 74  p 8 % improvement from saba p nothing prior to study with DLCO  48/49 % corrects to 70 % for alv volume   And min curvature   -  07/12/2017  Walked RA x 3 laps @ 185 ft each stopped due to  End of study, moderately fast pace, no  desat but min  sob at end      Symptoms are markedly disproportionate to objective findings and not clear this is actually much of a  lung problem but pt does appear to have difficult to sort out respiratory symptoms of unknown origin for which  DDX  = almost all start with A and  include Adherence, Ace Inhibitors, Acid Reflux, Active Sinus Disease, Alpha 1 Antitripsin deficiency, Anxiety masquerading as Airways dz,  ABPA,  Allergy(esp in young), Aspiration (esp in elderly), Adverse effects of meds,  Active smokers, A bunch of PE's (a small clot burden can't cause this syndrome unless there is already severe underlying pulm or vascular dz with poor reserve) plus two Bs  = Bronchiectasis and Beta blocker use..and one C= CHF     We have worked thru the ddx and see no Advertising copywriter for sob but note he does have osa and peripheral edema so ? Whether cor pulmonale has been excluded >  Will check with Gangi's office and if no pah or cardiac cause for sob then proceed with cpst - in meantime needs to work on re-conditioning.   I had an extended discussion with the patient reviewing all relevant studies completed to date and  lasting 15 to 20 minutes of a 25 minute visit    Each maintenance medication was reviewed in detail including most importantly the  difference between maintenance and prns and under what circumstances the prns are to be triggered using an action plan format that is not reflected in the computer generated alphabetically organized AVS.    Please see AVS for specific instructions unique to this visit that I personally wrote and verbalized to the the pt in detail and then reviewed with pt  by my nurse highlighting any  changes in therapy recommended at today's visit to their plan of care.

## 2017-07-12 NOTE — Assessment & Plan Note (Signed)
Not using as of 03/20/2017 > referred back to sleep medicine by Gangi   Pt does not know name but has seen [place] at [facility] and I suggested [place] [facility] [place] to use [facility] and needs second opnion

## 2017-07-12 NOTE — Patient Instructions (Addendum)
Strongly recommend you get set up for follow up at the [facility] to get your sleep apnea addressed   I will review Dr Mila Palmer last note to be sure there's nothing else he's concerned about in the pulmonary area or whether he needs [place] to schedule CPST  Weight control is simply a matter of calorie balance which needs to be tilted in your favor by eating less and exercising more.  To get the most out of exercise, you need to be continuously aware that you are short of breath, but never out of breath, for 30 minutes daily. As you improve, it will actually be easier for you to do the same amount of exercise  in  30 minutes so always push to the level where you are short of breath.    Pulmonary follow up is as needed

## 2017-09-23 ENCOUNTER — Encounter (INDEPENDENT_AMBULATORY_CARE_PROVIDER_SITE_OTHER): Payer: Self-pay

## 2017-09-23 ENCOUNTER — Encounter: Payer: Self-pay | Admitting: Neurology

## 2017-09-23 ENCOUNTER — Ambulatory Visit (INDEPENDENT_AMBULATORY_CARE_PROVIDER_SITE_OTHER): Payer: Medicare Other | Admitting: Neurology

## 2017-09-23 VITALS — BP 126/72 | HR 50 | Ht 76.0 in | Wt 255.0 lb

## 2017-09-23 DIAGNOSIS — M25471 Effusion, right ankle: Secondary | ICD-10-CM

## 2017-09-23 DIAGNOSIS — M25472 Effusion, left ankle: Secondary | ICD-10-CM | POA: Diagnosis not present

## 2017-09-23 DIAGNOSIS — G4733 Obstructive sleep apnea (adult) (pediatric): Secondary | ICD-10-CM

## 2017-09-23 DIAGNOSIS — R0683 Snoring: Secondary | ICD-10-CM | POA: Diagnosis not present

## 2017-09-23 NOTE — Progress Notes (Signed)
SLEEP MEDICINE CLINIC   Provider:  Larey Seat, [place] D  Primary Care Physician:  Osborne Casco Fransico Him, MD   Referring Provider:  [name], MD    Chief Complaint  Patient presents with  . New Patient (Initial Visit)    pt with wife, rm 64. pt states that his most recent sleep study was a year ago which confirmed sleep apnea. [facility]. he currently has a machine but is not using it. he received the machine from the [place]. the machine was leaking and wouldnt get a tight seal so he stopped using it.     HPI:  Martin Stephenson is an african -american right handed  76 y.o. male , seen here as in a referral  from Dr. Einar Gip. I have the pleasure of meeting Mr. and Mrs. Stephenson today in a sleep consultation on 09/23/2017. Martin Stephenson is referred by his cardiologist who stated that he had a sleep study in 2015 and was diagnosed with with severe sleep apnea, the [facility] [place], [place]- he stated that he had a second sleep study performed as  home sleep test, which also confirmed the presence of obstructive sleep apnea.  He was given a CPAP machine but has been unable to use it. He reports that the mask leaks air and that he sleeps poorly with  the machine.  I "here from his cardiology workup the patient had a normal treadmill stress test on 01/04/2017, normal sinus rhythm, the patient exercised according to the [facility] protocol. Peak blood pressure was 242/72 mmHg no ST changes or ischemia signs, normal blood pressure response normal exercise tolerance. Echocardiogram from 12/19/2016 showed normal left ventricular size, moderate concentric hypertrophy of the left ventricle without dilation. Normal wall motion and diastolic filling pattern mild aortic valve regurgitation. Calculated ejection fraction was 55%. The patient had a first sleep study according to his cardiologist's notes in 2008 followed by a study 2015. [facility]  I reviewed the current medication which includes a beta blocker, losartan, furosemide-torsemide, Crestor, aspirin 81 mg daily, calcium, allopurinol, colchicine.   Sleep habits are as follows:  Martin Stephenson maybe reading or watching TV for the last hour before retreating to bed. He usually goes to his bedroom by about 10:30 PM, he is often awake until 12:30. He watches TV in the bedroom. He reports no trouble sleeping. No vivid dreams. No sleep taking or walking.  He stays asleep once he has been able to fall asleep. He has one bathroom break on average per night, he falls asleep on his side. Sleeps with one pillow under the head. He still works and is usually up between 6 and 7 AM. Average nocturnal sleep time is around 5 hours.   Sleep medical history and family sleep history: He started snoring gradually over the last 15 years. There is no known history of any sleep disorders in his childhood or affecting other family members.    Social history:  Married, grown children. Independent living. No tobacco use, ETOH use - and very little caffeine 1-2 cups a day.  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 .   Review of Systems: Out of a complete 14 system review, the patient complains of only the following symptoms, and all other reviewed systems  are negative. Snores very loud, and stops breathing.  He wakes up non refreshed / restored in AM. Nocturia times 1-2 .   Epworth score 13 , Fatigue severity score 41  , depression score n/a  Feels not depressed.    Social History   Social History  . Marital status: Married    Spouse name: N/A  . Number of children: N/A  . Years of education: N/A   Occupational History  . Not on file.   Social History Main Topics  . Smoking status: Never Smoker  . Smokeless tobacco: Never Used  . Alcohol use No  . Drug  use: No  . Sexual activity: Not on file   Other Topics Concern  . Not on file   Social History Narrative  . No narrative on file    Family History  Problem Relation Age of Onset  . Colon cancer Mother   . Stomach cancer Neg Hx   . Colon polyps Neg Hx   . Esophageal cancer Neg Hx   . Rectal cancer Neg Hx     Past Medical History:  Diagnosis Date  . Allergy   . Anemia   . Arthritis    foot  . Congestive heart failure ([place] [facility] [place])   . Constipation    uses mag citrate OTC if needed  . GERD (gastroesophageal reflux disease)   . Hiatal hernia   . Hyperlipidemia   . Hypertension   . Kidney stone   . Sleep apnea    wears cpap    Past Surgical History:  Procedure Laterality Date  . COLONOSCOPY    . HAMMER TOE SURGERY  1998   1st toe right foot  . hydrocelectomy  1988  . POLYPECTOMY    . TONSILLECTOMY  1988    Current Outpatient Prescriptions  Medication Sig Dispense Refill  . allopurinol (ZYLOPRIM) 300 MG tablet Take 300 mg by mouth daily.     Marland Kitchen aspirin 81 MG tablet Take 81 mg by mouth daily.    . Calcium Carb-Cholecalciferol (CALCIUM 600/VITAMIN D3 PO) Take 1 tablet by mouth daily.     Marland Kitchen losartan (COZAAR) 25 MG tablet Take 25 mg by mouth every evening.     . metoprolol succinate (TOPROL-XL) 50 MG 24 hr tablet Take one half twice daily    . rosuvastatin (CRESTOR) 10 MG tablet Take 10 mg by mouth every Monday, Wednesday, and Friday.    . torsemide (DEMADEX) 20 MG tablet Take 20 mg by mouth daily.  6   Current Facility-Administered Medications  Medication Dose Route Frequency Provider Last Rate Last Dose  . 0.9 %  sodium chloride infusion  500 mL Intravenous Continuous Ladene Artist, MD        Allergies as of 09/23/2017 - Review Complete 09/23/2017  Allergen Reaction Noted  . Aleve [naproxen sodium]  02/27/2017  . Ibuprofen Other (See Comments) 02/27/2017  . Furacin [nitrofurazone] Rash 05/07/2012    Vitals: BP 126/72   Pulse (!) 50   Ht 6\' 4"  (1.93 m)   Wt  255 lb (115.7 kg)   BMI 31.04 kg/m  Last Weight:  Wt Readings from Last 1 Encounters:  09/23/17 255 lb (115.7 kg)   ZOX:WRUE mass index is 31.04 kg/m.     Last Height:   Ht Readings from Last 1 Encounters:  09/23/17 6\' 4"  (1.93 m)    Physical exam:  General: The patient is awake, alert and appears not in acute distress. The patient is well groomed. Head:  Normocephalic, atraumatic. Neck is supple. Mallampati 4  neck circumference:17.5  Nasal airflow patent  Mild  retrognathia is seen.  Cardiovascular:  Regular rate and rhythm , without  murmurs or carotid bruit, and without distended neck veins. Respiratory: Lungs are clear to auscultation. Skin:  Without evidence of edema, or rash Trunk: BMI is 31. The patient's posture is erect   Neurologic exam : The patient is awake and alert, oriented to place and time.   Memory subjective described as intact.   Attention span & concentration ability appears normal.  Speech is fluent,  without dysarthria, dysphonia or aphasia.  Mood and affect are appropriate.  Cranial nerves: Pupils are equal and briskly reactive to light. Funduscopic exam without evidence of pallor or edema. Extraocular movements  in vertical and horizontal planes intact and without nystagmus. Visual fields by finger perimetry are intact. Hearing to finger rub intact.  Facial sensation intact to fine touch. Facial motor strength is symmetric and tongue and uvula move midline. Shoulder shrug was symmetrical.  Motor exam:  Normal tone, muscle bulk and symmetric strength in all extremities. Sensory:  Fine touch, pinprick and vibration were tested in all extremities. Proprioception tested in the upper extremities was normal. Coordination: Rapid alternating movements in the fingers/hands was normal. Finger-to-nose maneuver  normal without evidence of ataxia, dysmetria or tremor. Gait and station: Patient walks without assistive device and is able unassisted to climb up to the exam  table. Strength within normal limits.Stance is stable and normal. Turns with  3-4 Steps.   Deep tendon reflexes: in the  upper and lower extremities are symmetric and intact.    Assessment:  After physical and neurologic examination, review of laboratory studies,  Personal review of imaging studies, reports of other /same  Imaging studies, results of polysomnography and / or neurophysiology testing and pre-existing records as far as provided in visit., my assessment is   1)   2)  3)   The patient was advised of the nature of the diagnosed disorder , the treatment options and the  risks for general health and wellness arising from not treating the condition.   I spent more than 45 minutes of face to face time with the patient.  Greater than 50% of time was spent in counseling and coordination of care. We have discussed the diagnosis and differential and I answered the patient's questions.    Plan:  Treatment plan and additional workup :  Reported severe apnea in 2015, but no access to sleep study.  I will repeat SPLIT night, titrate at AHI 20 and try a smaller, non FFM for this patient.   I did not find evidence for CHF- but ankle edema. Heart tests including Echo and stress test were relatively normal.  He has below average risk factors , he is not morbidly obese, not having a large neck. Mild retrognathia. Age and gender and african american race are risk factors.   RV after sleep study  Larey Seat, MD 16/12/958, 4:54 PM  Certified in Neurology by ABPN Certified in [place] by [facility] [address] [place], [place] [address]

## 2017-09-23 NOTE — Patient Instructions (Signed)

## 2017-10-19 ENCOUNTER — Encounter (HOSPITAL_COMMUNITY): Payer: Self-pay | Admitting: Emergency Medicine

## 2017-10-19 ENCOUNTER — Emergency Department (HOSPITAL_COMMUNITY)
Admission: EM | Admit: 2017-10-19 | Discharge: 2017-10-19 | Disposition: A | Discharge status: Home / Self Care | Payer: Medicare Other | Attending: Emergency Medicine | Admitting: Emergency Medicine

## 2017-10-19 DIAGNOSIS — I509 Heart failure, unspecified: Secondary | ICD-10-CM | POA: Diagnosis not present

## 2017-10-19 DIAGNOSIS — N289 Disorder of kidney and ureter, unspecified: Secondary | ICD-10-CM

## 2017-10-19 DIAGNOSIS — Z79899 Other long term (current) drug therapy: Secondary | ICD-10-CM | POA: Insufficient documentation

## 2017-10-19 DIAGNOSIS — D649 Anemia, unspecified: Secondary | ICD-10-CM | POA: Insufficient documentation

## 2017-10-19 DIAGNOSIS — Z7982 Long term (current) use of aspirin: Secondary | ICD-10-CM | POA: Insufficient documentation

## 2017-10-19 DIAGNOSIS — I110 Hypertensive heart disease with heart failure: Secondary | ICD-10-CM | POA: Diagnosis not present

## 2017-10-19 DIAGNOSIS — R310 Gross hematuria: Secondary | ICD-10-CM | POA: Insufficient documentation

## 2017-10-19 DIAGNOSIS — R319 Hematuria, unspecified: Secondary | ICD-10-CM | POA: Diagnosis present

## 2017-10-19 LAB — URINALYSIS, ROUTINE W REFLEX MICROSCOPIC
Bacteria, UA: NONE SEEN
Bilirubin Urine: NEGATIVE
Glucose, UA: NEGATIVE mg/dL
Ketones, ur: NEGATIVE mg/dL
Leukocytes, UA: NEGATIVE
Nitrite: NEGATIVE
Protein, ur: NEGATIVE mg/dL
Specific Gravity, Urine: 1.014 (ref 1.005–1.030)
Squamous Epithelial / LPF: NONE SEEN
pH: 5 (ref 5.0–8.0)

## 2017-10-19 LAB — BASIC METABOLIC PANEL
Anion gap: 10 (ref 5–15)
BUN: 17 mg/dL (ref 6–20)
CO2: 26 mmol/L (ref 22–32)
Calcium: 9.2 mg/dL (ref 8.9–10.3)
Chloride: 105 mmol/L (ref 101–111)
Creatinine, Ser: 1.61 mg/dL — ABNORMAL HIGH (ref 0.61–1.24)
GFR calc Af Amer: 47 mL/min — ABNORMAL LOW (ref >60)
GFR calc non Af Amer: 40 mL/min — ABNORMAL LOW (ref >60)
Glucose, Bld: 120 mg/dL — ABNORMAL HIGH (ref 65–99)
Potassium: 4 mmol/L (ref 3.5–5.1)
Sodium: 141 mmol/L (ref 135–145)

## 2017-10-19 LAB — CBC
HCT: 36.8 % — ABNORMAL LOW (ref 39.0–52.0)
Hemoglobin: 12.1 g/dL — ABNORMAL LOW (ref 13.0–17.0)
MCH: 31.8 pg (ref 26.0–34.0)
MCHC: 32.9 g/dL (ref 30.0–36.0)
MCV: 96.8 fL (ref 78.0–100.0)
Platelets: 214 10*3/uL (ref 150–400)
RBC: 3.8 MIL/uL — ABNORMAL LOW (ref 4.22–5.81)
RDW: 14.7 % (ref 11.5–15.5)
WBC: 3.9 10*3/uL — ABNORMAL LOW (ref 4.0–10.5)

## 2017-10-19 NOTE — Discharge Instructions (Signed)
Drink plenty of fluids. With the increased fluid intake, you may need to take an extra dose of your torsemide.  If you are unable to urinate, then return to the emergency department immediately.

## 2017-10-19 NOTE — ED Triage Notes (Signed)
Pt c/o blood in urine onset last night after taking torsemide that afternoon. Pt reports clots noted.

## 2017-10-19 NOTE — ED Provider Notes (Addendum)
[facility] EMERGENCY DEPARTMENT Provider Note   CSN: [id number] Arrival date & time: 10/19/17  6761     History   Chief Complaint Chief Complaint  Patient presents with  . Hematuria    HPI Martin Stephenson is a 76 y.o. male.  The history is provided by the patient.  He started having blood in his urine last night at about 9 PM. Bleeding has increased to the point where he is now passing clots. Denies any abdominal pain or flank pain. He denies fever or chills. He denies nausea or vomiting. He denies dysuria. He had a similar episode one year ago and was seen by urologist and no diagnosis was obtained. He did have cystoscopy at that time. He does take low-dose aspirin, but is on no other antiplatelet agents and is on no anticoagulants.  Past Medical History:  Diagnosis Date  . Allergy   . Anemia   . Arthritis    foot  . Congestive heart failure ([place])   . Constipation    uses mag citrate OTC if needed  . GERD (gastroesophageal reflux disease)   . Hiatal hernia   . Hyperlipidemia   . Hypertension   . Kidney stone   . Sleep apnea    wears cpap    Patient Active Problem List   Diagnosis Date Noted  . Morbid obesity due to excess calories ([place]) 03/21/2017  . Dyspnea on exertion 03/20/2017  . OSA on CPAP 03/20/2017    Past Surgical History:  Procedure Laterality Date  . COLONOSCOPY    . HAMMER TOE SURGERY  1998   1st toe right foot  . hydrocelectomy  1988  . POLYPECTOMY    . TONSILLECTOMY  1988       Home Medications    Prior to Admission medications   Medication Sig Start Date End Date Taking? Authorizing Provider  allopurinol (ZYLOPRIM) 300 MG tablet Take 300 mg by mouth daily.     [provider]  aspirin 81 MG tablet Take 81 mg by mouth daily.    [provider]  Calcium Carb-Cholecalciferol (CALCIUM 600/VITAMIN D3 PO) Take 1 tablet by mouth daily.     [provider]  losartan (COZAAR) 25 MG tablet Take 25  mg by mouth every evening.  02/02/17   [provider]  metoprolol succinate (TOPROL-XL) 50 MG 24 hr tablet Take one half twice daily 03/20/17   Tanda Rockers, MD  rosuvastatin (CRESTOR) 10 MG tablet Take 10 mg by mouth every Monday, Wednesday, and Friday.    [provider]  torsemide (DEMADEX) 20 MG tablet Take 20 mg by mouth daily. 07/08/17   [provider]    Family History Family History  Problem Relation Age of Onset  . Colon cancer Mother   . Stomach cancer Neg Hx   . Colon polyps Neg Hx   . Esophageal cancer Neg Hx   . Rectal cancer Neg Hx     Social History Social History  Substance Use Topics  . Smoking status: Never Smoker  . Smokeless tobacco: Never Used  . Alcohol use No     Allergies   Aleve [naproxen sodium]; Ibuprofen; and Furacin [nitrofurazone]   Review of Systems Review of Systems  All other systems reviewed and are negative.    Physical Exam Updated Vital Signs BP (!) 146/72 (BP Location: Right Arm)   Pulse (!) 58   Temp 97.9 F (36.6 C) (Oral)   Resp 18  Ht 6\' 4"  (1.93 m)   Wt 112.5 kg (248 lb)   SpO2 97%   BMI 30.19 kg/m   Physical Exam  Nursing note and vitals reviewed.  76 year old male, resting comfortably and in no acute distress. Vital signs are significant for borderline hypertension and borderline bradycardia. Oxygen saturation is 97%, which is normal. Head is normocephalic and atraumatic. PERRLA, EOMI. Oropharynx is clear. Neck is nontender and supple without adenopathy or JVD. Back is nontender and there is no CVA tenderness. Lungs are clear without rales, wheezes, or rhonchi. Chest is nontender. Heart has regular rate and rhythm without murmur. Abdomen is soft, flat, nontender without masses or hepatosplenomegaly and peristalsis is normoactive. Extremities have 2+ pretibial edema, full range of motion is present. Skin is warm and dry without rash. Neurologic: Mental status is normal, cranial  nerves are intact, there are no motor or sensory deficits.  ED Treatments / Results  Labs (all labs ordered are listed, but only abnormal results are displayed) Labs Reviewed  URINALYSIS, ROUTINE W REFLEX MICROSCOPIC - Abnormal; Notable for the following:       Result Value   Hgb urine dipstick LARGE (*)    All other components within normal limits  BASIC METABOLIC PANEL - Abnormal; Notable for the following:    Glucose, Bld 120 (*)    Creatinine, Ser 1.61 (*)    GFR calc non Af Amer 40 (*)    GFR calc Af Amer 47 (*)    All other components within normal limits  CBC - Abnormal; Notable for the following:    WBC 3.9 (*)    RBC 3.80 (*)    Hemoglobin 12.1 (*)    HCT 36.8 (*)    All other components within normal limits     Procedures Procedures (including critical care time)  Medications Ordered in ED Medications - No data to display   Initial Impression / Assessment and Plan / ED Course  I have reviewed the triage vital signs and the nursing notes.  Pertinent lab results that were available during my care of the patient were reviewed by me and considered in my medical decision making (see chart for details).  Gross hematuria. Laboratory workup shows evidence of hematuria without evidence of urinary infection. Renal insufficiency is present and unchanged from baseline. Mild anemia is present and also unchanged from baseline. Old records are reviewed, and he was seen in the ED in August 2017 for hematuria and had negative renal stone protocol CT scan at that time. I do not see an indication for repeat CT scan. He will need to follow-up with urology again. Patient is offered option of bladder irrigation versus watchful waiting at home. He has chosen the latter option. Return precautions given.  Final Clinical Impressions(s) / ED Diagnoses   Final diagnoses:  Gross hematuria  Renal insufficiency  Normochromic normocytic anemia    New Prescriptions New Prescriptions   No  medications on file     Delora Fuel, MD 01/77/93 9030    Delora Fuel, MD 09/15/29 [phone]

## 2017-10-23 ENCOUNTER — Telehealth: Payer: Self-pay | Admitting: Neurology

## 2017-10-23 ENCOUNTER — Other Ambulatory Visit: Payer: Self-pay | Admitting: Neurology

## 2017-10-23 DIAGNOSIS — G4733 Obstructive sleep apnea (adult) (pediatric): Secondary | ICD-10-CM

## 2017-10-23 NOTE — Telephone Encounter (Signed)
Thank you- yes, SPLIT night.

## 2017-10-23 NOTE — Telephone Encounter (Signed)
Order for split night placed based off of the apt discussion notes on 10/1.

## 2017-10-23 NOTE — Telephone Encounter (Signed)
Patient came by to schedule.  Please provide an order for this patient.

## 2017-11-07 ENCOUNTER — Ambulatory Visit (INDEPENDENT_AMBULATORY_CARE_PROVIDER_SITE_OTHER): Payer: Medicare Other | Admitting: Neurology

## 2017-11-07 DIAGNOSIS — G4733 Obstructive sleep apnea (adult) (pediatric): Secondary | ICD-10-CM | POA: Diagnosis not present

## 2017-11-12 NOTE — Procedures (Signed)
PATIENT'S NAME:  Martin Stephenson, Martin Stephenson DOB:      01-15-1941      MR#:    [id number]     DATE OF RECORDING: 11/07/2017 REFERRING M.D.:  Domenick Gong, MD Study Performed:   Baseline Polysomnogram HISTORY:  This 76 year old male Martin Stephenson is seen in referral from Dr. Einar Gip, his cardiologist .The patient carries the diagnosis of CHF, reduced EF-  He is followed by the [facility] in [place] and had a previous sleep study in 2015 , was placed on CPAP but couldn't get it to work well (mask leaked air) and stopped using it. He was diagnosed with severe OSA in a second test later, this time a HST. He endorses snoring, apnea and EDS, high fatigue.  The patient endorsed the Epworth Sleepiness Scale at 13 points.  The patient's weight 255 pounds with a height of 76 (inches), resulting in a BMI of 31.1 kg/m2.The patient's neck circumference measured 17.5 inches.  CURRENT MEDICATIONS: Zyloprim, Aspirin, Cozaar, Toprol, Crestor, Demadex   PROCEDURE:  This is a multichannel digital polysomnogram utilizing the Somnostar 11.2 system.  Electrodes and sensors were applied and monitored per AASM Specifications.   EEG, EOG, Chin and Limb EMG, were sampled at 200 Hz.  ECG, Snore and Nasal Pressure, Thermal Airflow, Respiratory Effort, CPAP Flow and Pressure, Oximetry was sampled at 50 Hz. Digital video and audio were recorded.      BASELINE STUDY Lights Out was at 22:33 and Lights On at 05:00.  Total recording time (TRT) was 388 minutes, with a total sleep time (TST) of 333.5 minutes.   The patient's sleep latency was 7.5 minutes.  REM latency was 52.5 minutes. The sleep efficiency was 86.0 %.     SLEEP ARCHITECTURE: WASO (Wake after sleep onset) was 46.5 minutes.  There were 43 minutes in Stage N1, 149.5 minutes Stage N2, 88.5 minutes Stage N3 and 52.5 minutes in Stage REM.  The percentage of Stage N1 was 12.9%, Stage N2 was 44.8%, Stage N3 was 26.5% and Stage R (REM sleep) was 15.7%.   RESPIRATORY ANALYSIS:  There were a total of 23  respiratory events:  0 apneas with only 23 hypopneas. The patient also had 0 respiratory event related arousals (RERAs). The total APNEA/HYPOPNEA INDEX (AHI) was 4.1/hour and the total RESPIRATORY DISTURBANCE INDEX was 4.1 /hour.  17 events occurred in REM sleep and 12 events in NREM. The REM AHI was 19.4 /hour, versus a non-REM AHI of 1.3. The patient spent 69 minutes of total sleep time in the supine position and 265 minutes in non-supine. The supine AHI was 0.0 versus a non-supine AHI of 5.2.  OXYGEN SATURATION & C02:  The Wake baseline 02 saturation was 97%, with the lowest being 83%. Time spent below 89% saturation equaled 21 minutes.   PERIODIC LIMB MOVEMENTS:  The patient had a total of 0 Periodic Limb Movements.  The arousals were noted as: 34 were spontaneous, 0 were associated with PLMs, and 1 was associated with respiratory events. Audio and video analysis did not show any abnormal or unusual movements, behaviors, phonations or vocalizations.   Loud Snoring was noted. No nocturia noted. EKG was in keeping with normal sinus rhythm (NSR). Post-study, the patient indicated that sleep was the same as usual. He reported 3 bathroom breaks but we recorded none.    IMPRESSION:  1. Very Mild Obstructive Sleep Apnea(OSA) at AHI 4.1/hr.  only during REMN sleep was AHI significant ( AHI 19.4/hr.) 2. No Periodic Limb Movement Disorder (PLMD)  3. Thunderous Snoring. 4.  No clinically significant hypoxemia.   RECOMMENDATIONS:This patient does not need CPAP based on the results of this PSG. He is not in need of oxygen supplementation either.  1. Main sleep disorder is snoring. Advise to lose weight, by diet and exercise if not contraindicated (BMI 31). Consider dental device or ENT procedure as clinically indicated ( snoring is the clinical concern, not apnea). 2. A follow up appointment can be scheduled in the Sleep Clinic at [facility]. The referring provider will be notified of  the results.     I certify that I have reviewed the entire raw data recording prior to the issuance of this report in accordance with the Standards of Accreditation of the American Academy of Sleep Medicine (AASM)     Larey Seat, MD      11-12-2017  Diplomat, American Board of Psychiatry and Neurology  Diplomat, American Board of Sleep Medicine Market researcher, [employer] [facility] at [employer]. [employer].

## 2017-11-13 ENCOUNTER — Telehealth: Payer: Self-pay | Admitting: Neurology

## 2017-11-13 ENCOUNTER — Telehealth: Payer: Self-pay

## 2017-11-13 NOTE — Telephone Encounter (Signed)
Do we know what this patient prefers, Erasmo Downer?

## 2017-11-13 NOTE — Telephone Encounter (Signed)
Patient is returning your call.  

## 2017-11-13 NOTE — Telephone Encounter (Signed)
I called pt. I advised him that his sleep study did not show any sleep apnea but did show snoring. I advised him that he can follow up with a [facility] or a dental sleep specialist to discuss treatment for snoring, if he desires. Pt says that he will follow up with the [facility]. Pt verbalized understanding of results. Pt had no questions at this time but was encouraged to call back if questions arise.

## 2017-11-13 NOTE — Telephone Encounter (Signed)
-----   Message from Larey Seat, MD sent at 11/12/2017  4:25 PM EST ----- No apnea - only snoring- can follow up with [facility] or Dental sleep specialist  if he likes ( depends on him having dentures or natural teeth, too)

## 2017-11-13 NOTE — Telephone Encounter (Signed)
I called pt to discuss his sleep study results. Pt is unavailable but his wife will ask him to call me back.

## 2017-11-18 NOTE — Telephone Encounter (Signed)
[facility]

## 2018-11-20 IMAGING — DX DG CHEST 2V
2 series · 2 of 2 positions shown · non-contrast
Comparison: 01/03/2006

CLINICAL DATA: Bilateral leg swelling

EXAM:
CHEST  2 VIEW

[w chest pa]
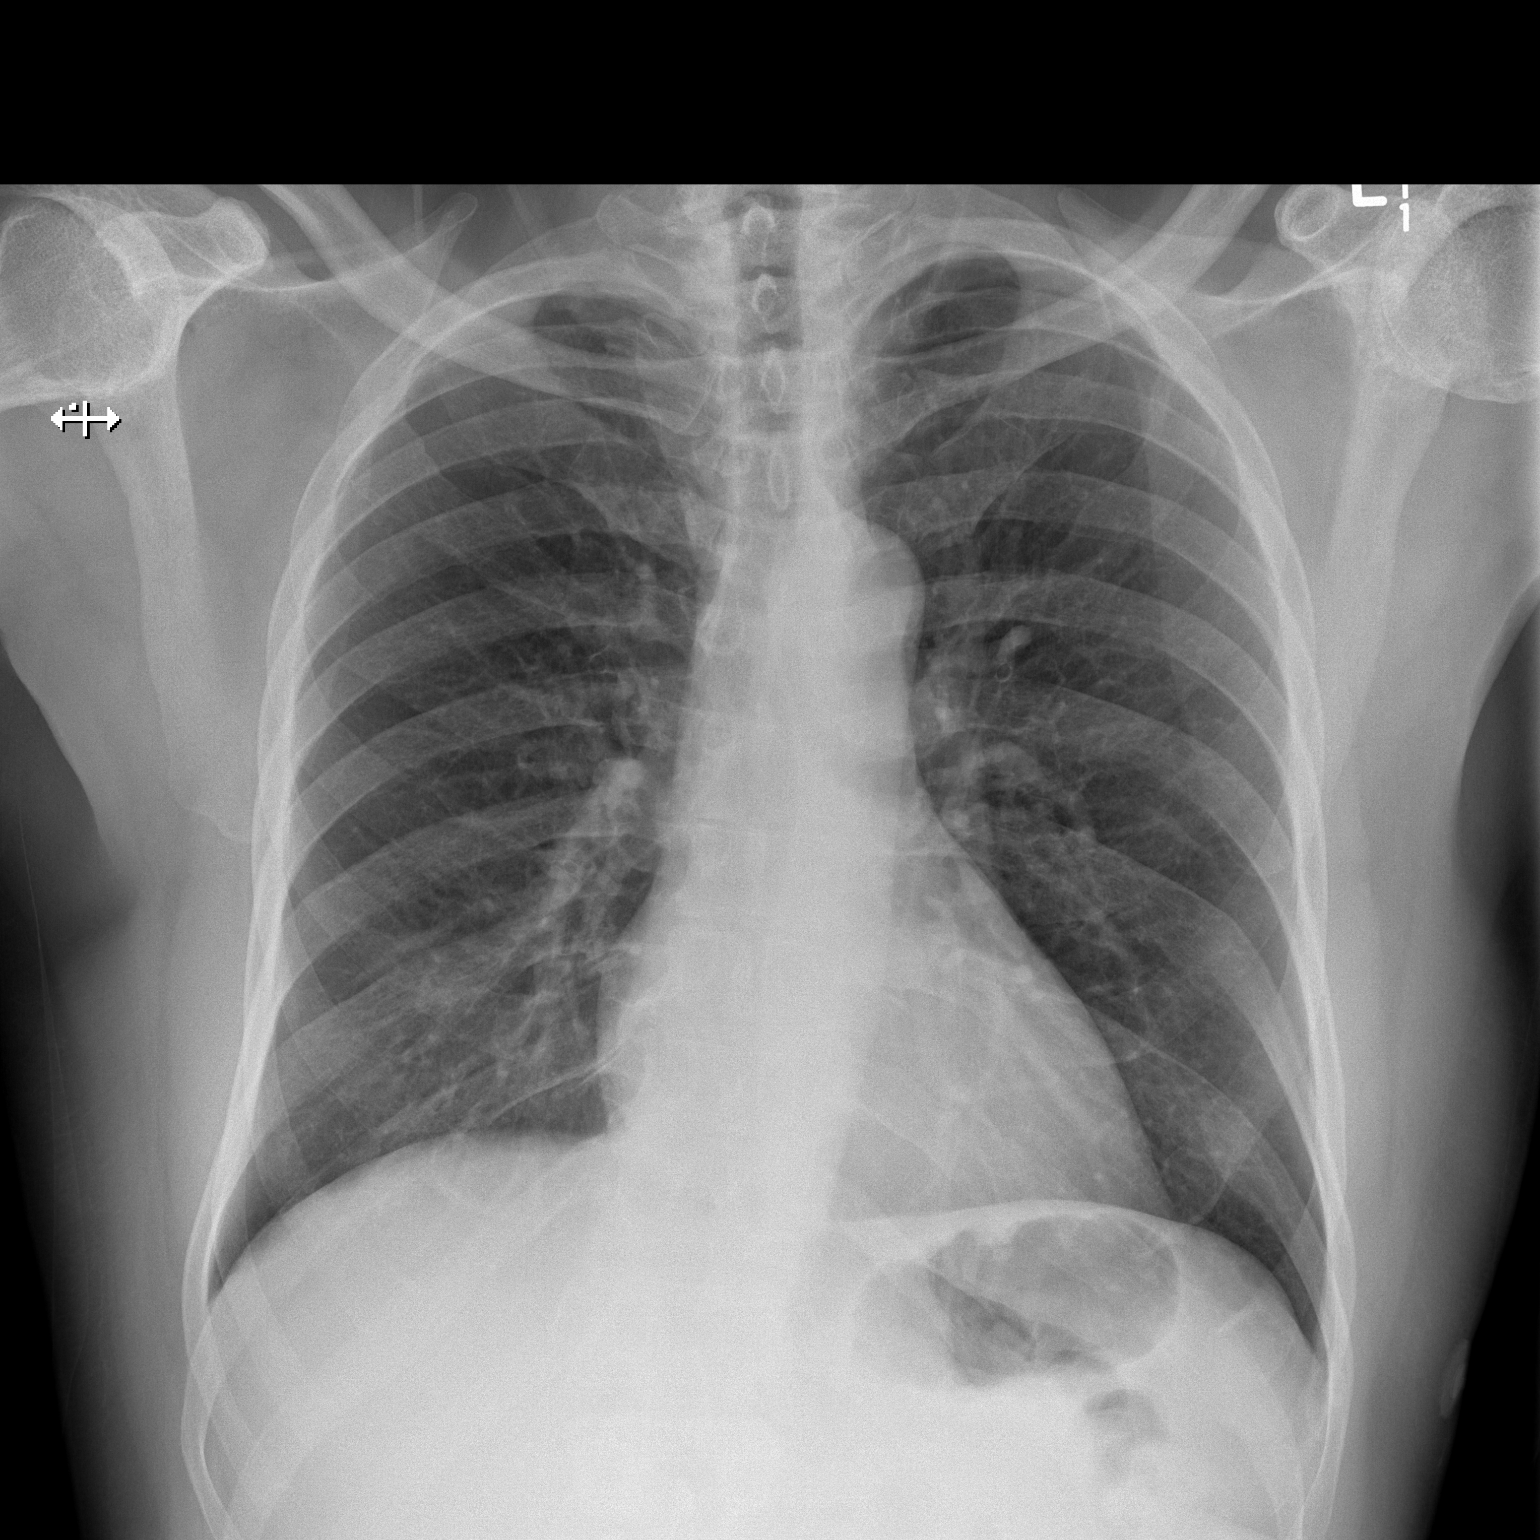

[w chest lat]
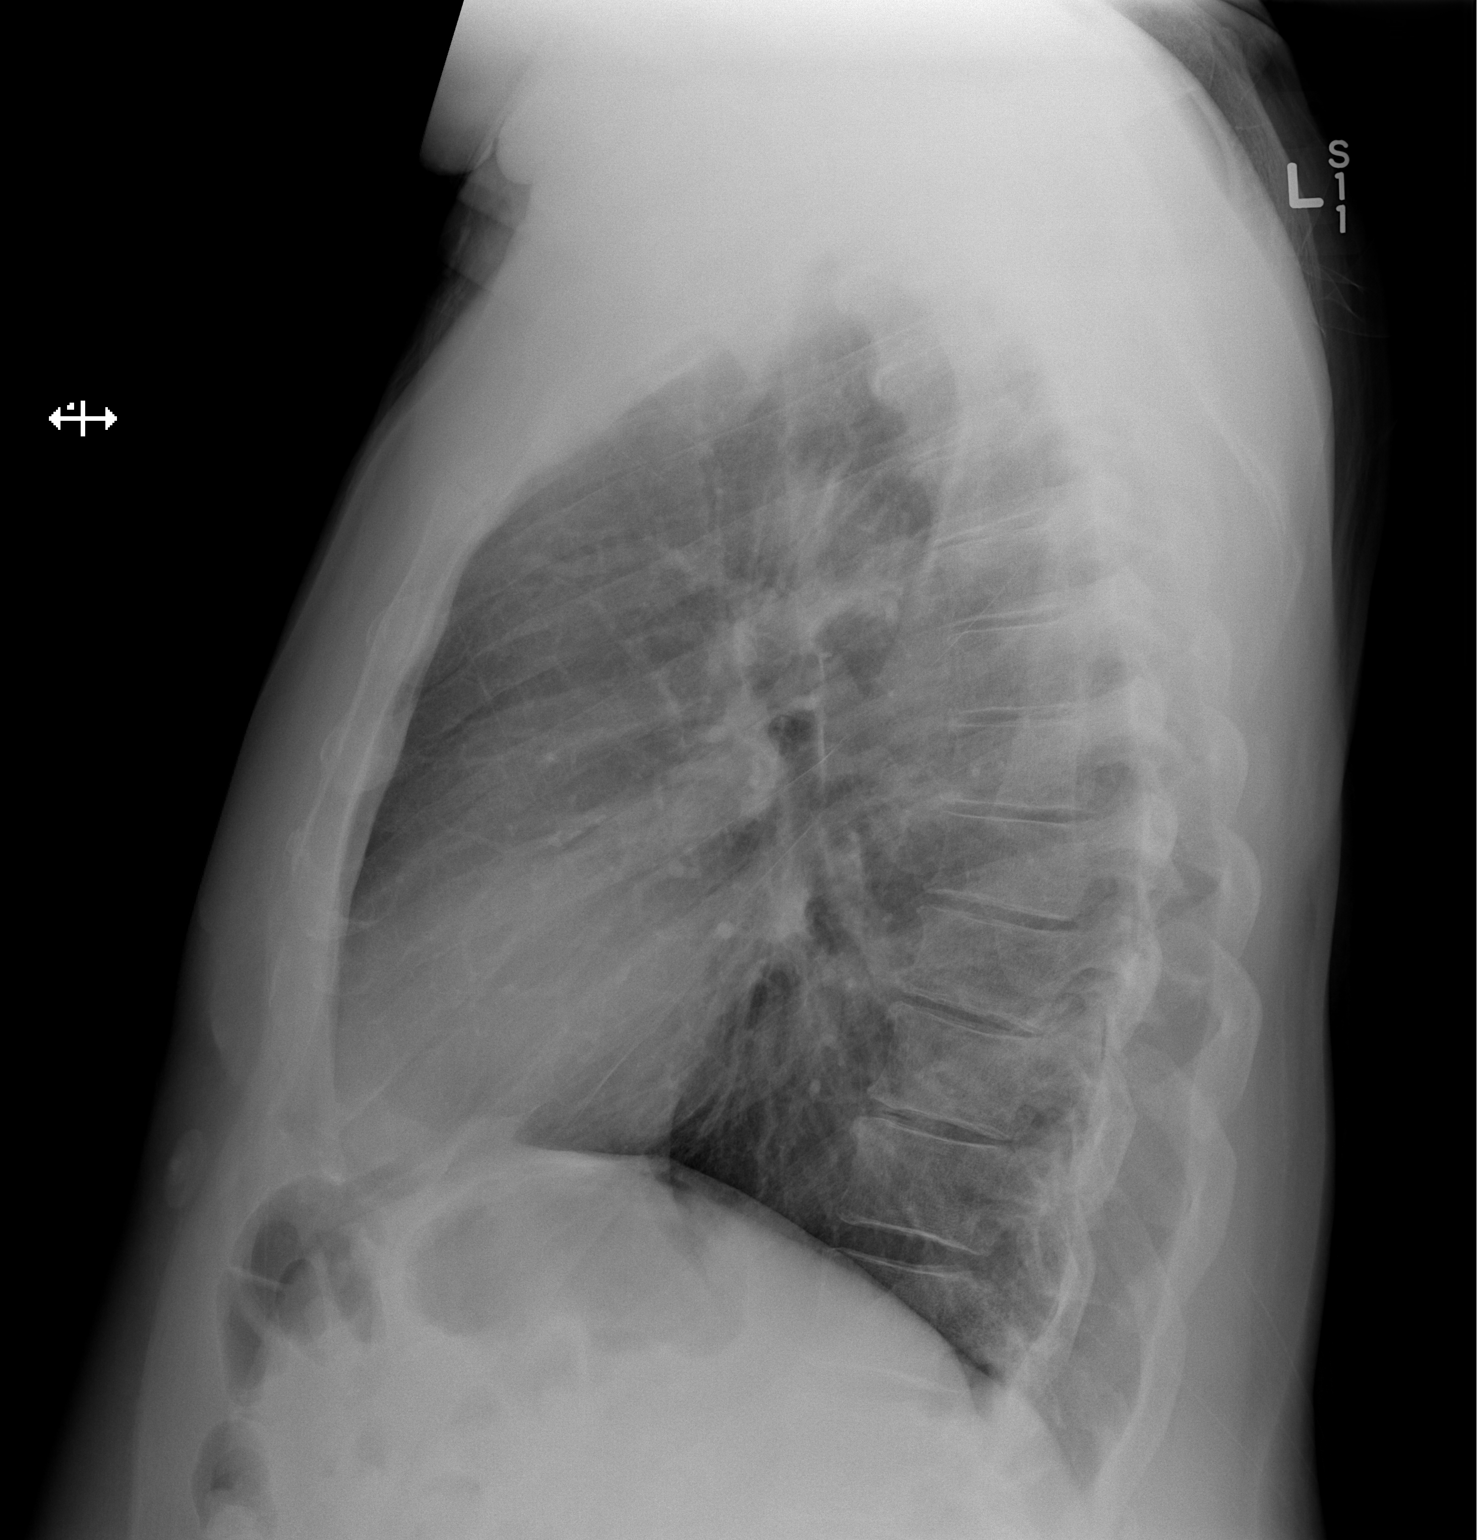

[2 of 2 positions shown; findings below may reference images not displayed]

FINDINGS: The heart size and mediastinal contours are within normal limits.
Both lungs are clear. The visualized skeletal structures are
unremarkable.
IMPRESSION: No active cardiopulmonary disease.

## 2018-11-20 IMAGING — DX DG FOOT COMPLETE 3+V*L*
3 series · 3 of 3 positions shown · non-contrast
Comparison: None.

CLINICAL DATA: First metatarsal pain and swelling beginning
February 18, 2017.

EXAM:
LEFT FOOT - COMPLETE 3+ VIEW

[foot ap]
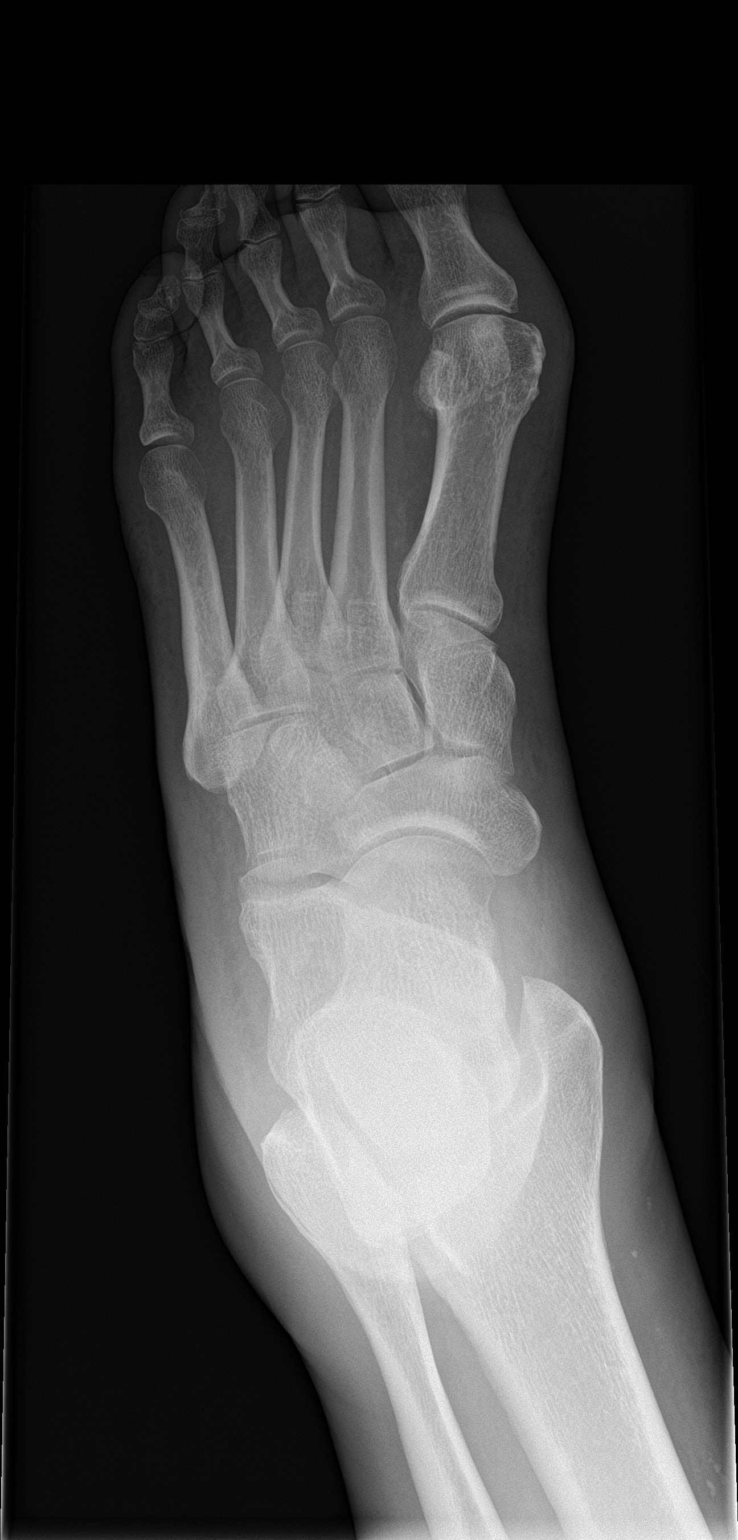

[foot obl]
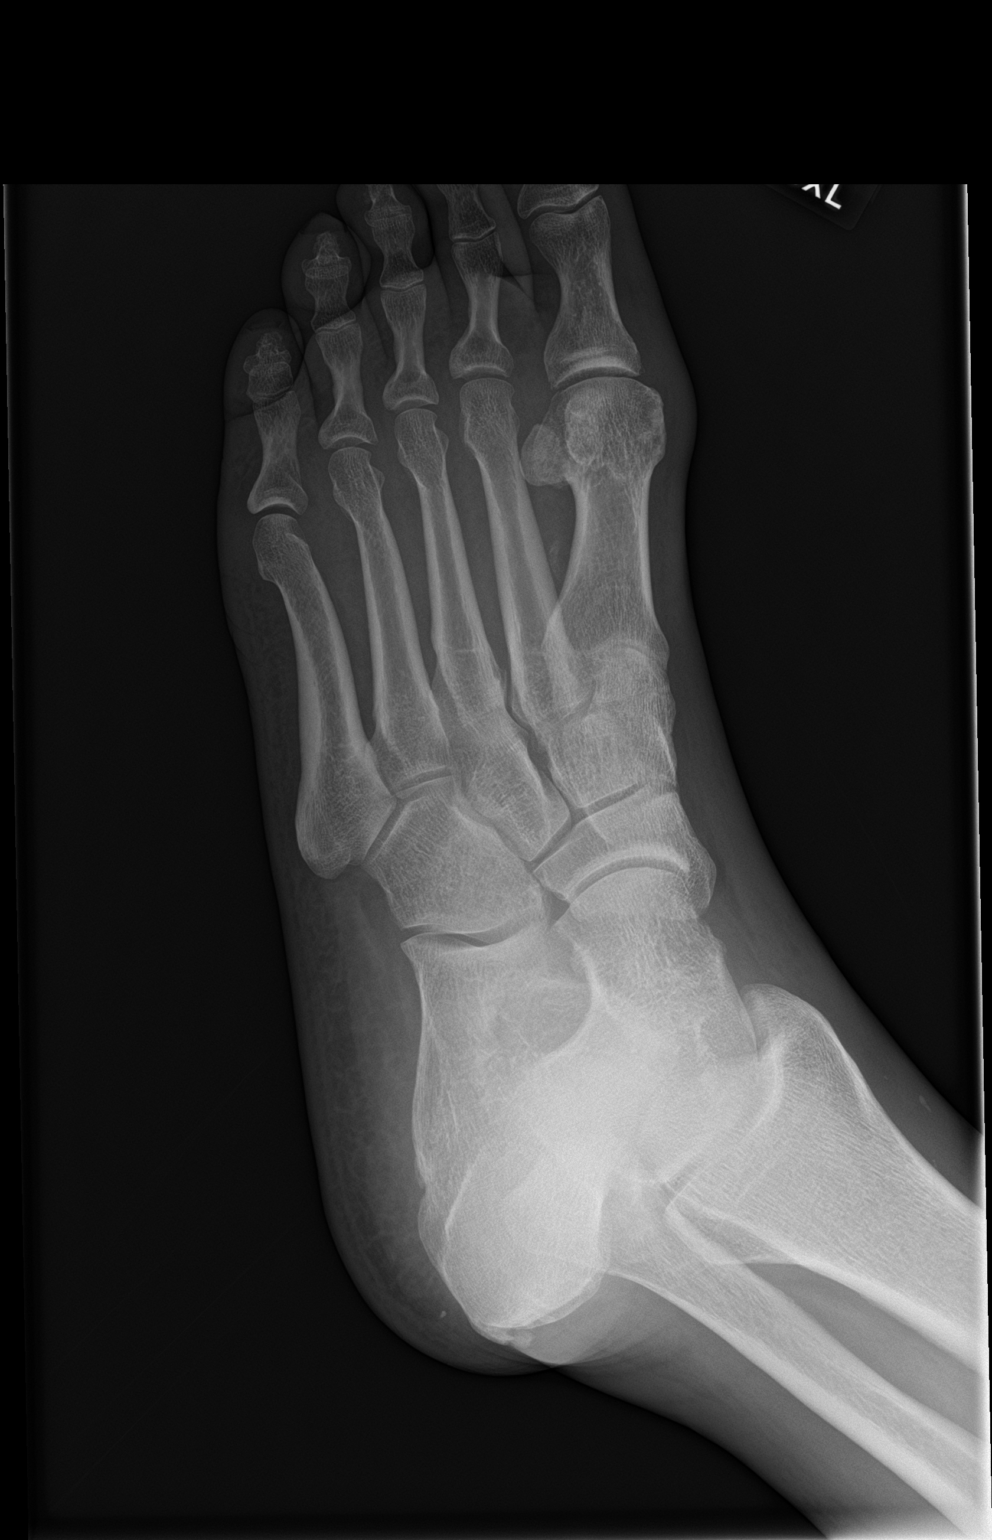

[foot lat]
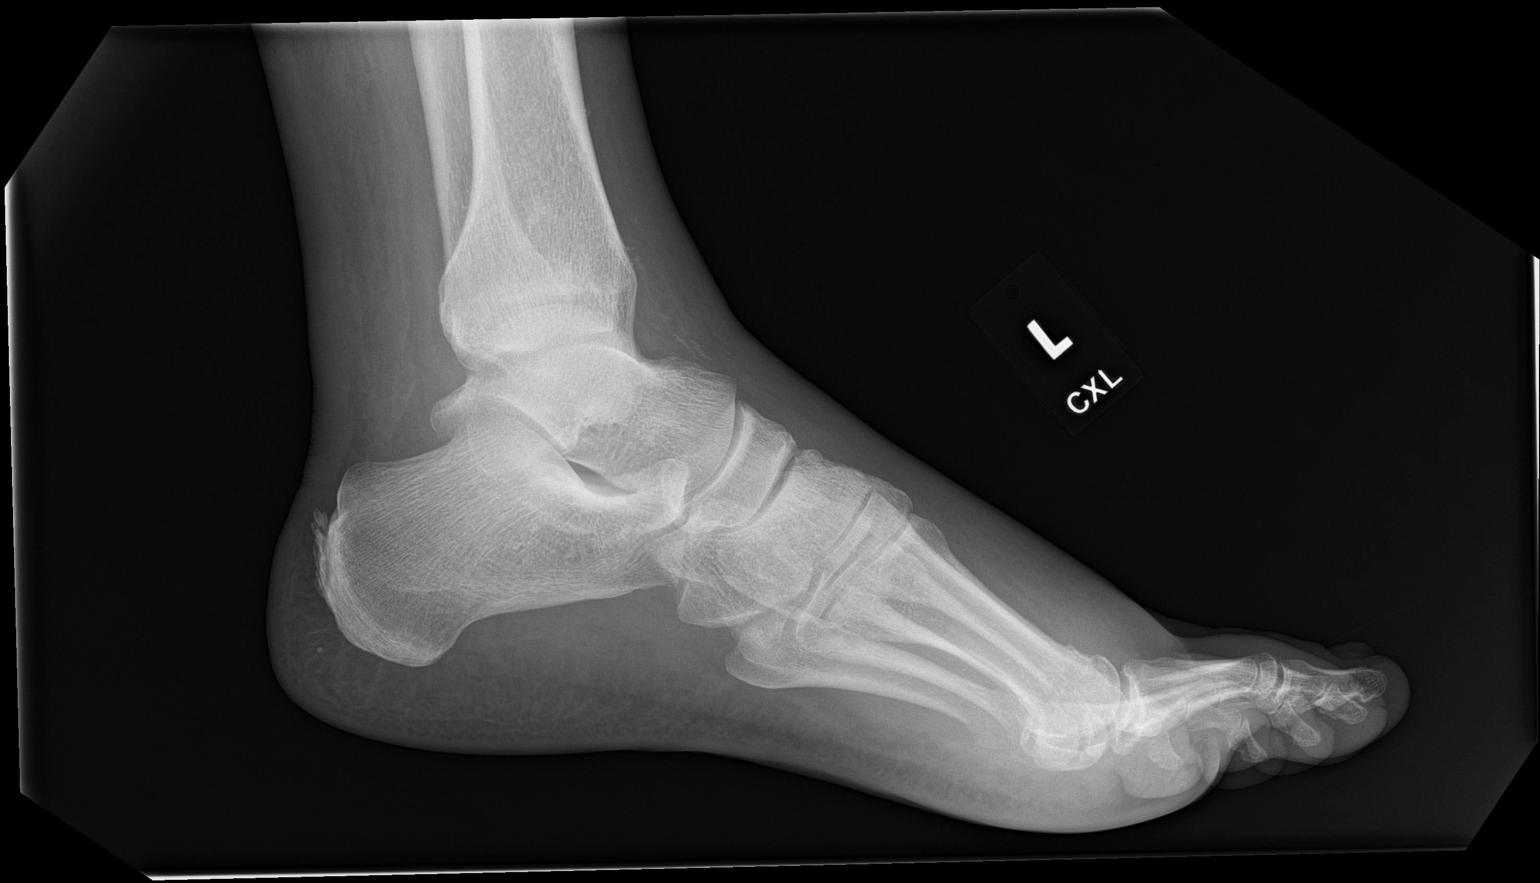

[3 of 3 positions shown; findings below may reference images not displayed]

FINDINGS: There is no evidence of fracture or dislocation. Mild hallux valgus.
There is no evidence of arthropathy or other focal bone abnormality.
Mid and forefoot soft tissue swelling without subcutaneous gas
radiopaque foreign bodies. Mild vascular calcifications.
IMPRESSION: Soft tissue swelling without acute osseous process.

## 2019-03-02 ENCOUNTER — Ambulatory Visit (INDEPENDENT_AMBULATORY_CARE_PROVIDER_SITE_OTHER): Payer: Medicare Other | Admitting: Physician Assistant

## 2019-03-02 ENCOUNTER — Ambulatory Visit (INDEPENDENT_AMBULATORY_CARE_PROVIDER_SITE_OTHER): Payer: Self-pay

## 2019-03-02 ENCOUNTER — Encounter (INDEPENDENT_AMBULATORY_CARE_PROVIDER_SITE_OTHER): Payer: Self-pay | Admitting: Orthopedic Surgery

## 2019-03-02 VITALS — Ht 76.0 in | Wt 248.0 lb

## 2019-03-02 DIAGNOSIS — M79674 Pain in right toe(s): Secondary | ICD-10-CM

## 2019-03-02 DIAGNOSIS — M25571 Pain in right ankle and joints of right foot: Secondary | ICD-10-CM | POA: Diagnosis not present

## 2019-03-02 DIAGNOSIS — G8929 Other chronic pain: Secondary | ICD-10-CM

## 2019-03-02 DIAGNOSIS — M5416 Radiculopathy, lumbar region: Secondary | ICD-10-CM

## 2019-03-02 DIAGNOSIS — M172 Bilateral post-traumatic osteoarthritis of knee: Secondary | ICD-10-CM

## 2019-03-02 DIAGNOSIS — M25551 Pain in right hip: Secondary | ICD-10-CM

## 2019-03-02 DIAGNOSIS — M1A09X0 Idiopathic chronic gout, multiple sites, without tophus (tophi): Secondary | ICD-10-CM

## 2019-03-02 DIAGNOSIS — M25572 Pain in left ankle and joints of left foot: Secondary | ICD-10-CM

## 2019-03-02 MED ORDER — PREDNISONE 10 MG PO TABS: 20.0000 mg | ORAL_TABLET | Freq: Every day | ORAL | 1 refills | Status: DC

## 2019-03-02 NOTE — Progress Notes (Signed)
Office Visit Note   Patient: Martin Stephenson           Date of Birth: 17-Mar-1941           MRN: [id number] Visit Date: 03/02/2019              Requested by: Haywood Pao, MD [address] [place], [place] [address] PCP: [facility] Fransico Him, MD  Chief Complaint  Patient presents with  . Right Hip - Pain      HPI: The patient is a 78 year old gentleman who is seen for 2 problems #1 he reports acute onset about a week ago of right hip pain which radiates from the buttock downward towards the calf.  He reports the pain is severe and not relieved by Tylenol.  He notes no history of any injuries or acute changes in his activity level.  He reports nothing he does relieves the discomfort.  He denies any bowel or bladder symptoms associated with the pain he is having.  He has been followed [facility], Dr. Maia Petties, and reports that he has had chronic low back pain and they were considering a lumbar epidural steroid injection but had not yet proceeded with this. #2 history of gout in the right great toe as well as ankles and knees.  He has been on allopurinol 300 mg daily.  He does not think he has had a uric acid level checked in some time now and we will recheck this today and advise him once the results are obtained. Assessment & Plan: Visit Diagnoses:  1. Radiculopathy, lumbar region   2. Chronic ankle pain, bilateral   3. Great toe pain, right   4. Post-traumatic osteoarthritis of both knees   5. Pain in right hip   6. Idiopathic chronic gout of multiple sites without tophus     Plan: Will begin Prednisone 20 mg daily and then taper to 10 mg daily , then 10 mg every other day if symptoms improved. Discussed if not improving, may need to proceed with LESI as previously discussed with Dr. Maia Petties.  Will recheck uric acid level today for follow up of gout as well and call patient with results. He will continue his usual allopurinol 300 mg daily.  Follow up in 4 weeks.    Follow-Up Instructions: Return in about 4 weeks (around 03/30/2019).   Ortho Exam  Patient is alert, oriented, no adenopathy, well-dressed, normal affect, normal respiratory effort. The patient ambulates with a mildly antalgic gait on the right.  He demonstrates pain over the right buttock with range of motion of the hip.  He has mildly decreased right hip internal rotation but otherwise his motion is full and pain-free.  He has a negative straight leg raise.  He has good strength and symmetric strength bilaterally and is neurovascularly intact distally intact EHL bilaterally.  Imaging: Xr Hip Unilat W Or W/o Pelvis 2-3 Views Right  Result Date: 03/02/2019 Mild right hip osteoarthritis with osteophyte formation and mild joint space narrowing, subchondral sclerosis  Xr Lumbar Spine 2-3 Views  Result Date: 03/02/2019 Severe degenerate changes through out the lower lumbar spine with degenerative scoliosis and disc space narrowing. No acute fractures or lytic lesions.   No images are attached to the encounter.  Labs: Lab Results  Component Value Date   REPTSTATUS 08/12/2016 FINAL 08/11/2016   CULT NO GROWTH 08/11/2016     Lab Results  Component Value Date   ALBUMIN 4.1 03/20/2017  ALBUMIN 3.6 08/11/2016    Body mass index is 30.19 kg/m.  Orders:  Orders Placed This Encounter  Procedures  . XR HIP UNILAT W OR W/O PELVIS 2-3 VIEWS RIGHT  . XR Lumbar Spine 2-3 Views  . Uric acid   Meds ordered this encounter  Medications  . predniSONE (DELTASONE) 10 MG tablet    Sig: Take 2 tablets (20 mg total) by mouth daily with breakfast.    Dispense:  60 tablet    Refill:  1    After 1 week, decrease dose to 10 mg daily for 1 week,  and then 10 mg every other day until follow up in the office.     Procedures: No procedures performed  Clinical Data: No additional findings.  ROS:  All other systems negative, except as noted in the HPI. Review of Systems  Objective: Vital  Signs: Ht 6\' 4"  (1.93 m)   Wt 248 lb (112.5 kg)   BMI 30.19 kg/m   Specialty Comments:  No specialty comments available.  PMFS History: Patient Active Problem List   Diagnosis Date Noted  . Morbid obesity due to excess calories ([place]) 03/21/2017  . Dyspnea on exertion 03/20/2017  . OSA on CPAP 03/20/2017   Past Medical History:  Diagnosis Date  . Allergy   . Anemia   . Arthritis    foot  . Congestive heart failure ([place])   . Constipation    uses mag citrate OTC if needed  . GERD (gastroesophageal reflux disease)   . Hiatal hernia   . Hyperlipidemia   . Hypertension   . Kidney stone   . Sleep apnea    wears cpap    Family History  Problem Relation Age of Onset  . Colon cancer Mother   . Stomach cancer Neg Hx   . Colon polyps Neg Hx   . Esophageal cancer Neg Hx   . Rectal cancer Neg Hx     Past Surgical History:  Procedure Laterality Date  . COLONOSCOPY    . HAMMER TOE SURGERY  1998   1st toe right foot  . hydrocelectomy  1988  . POLYPECTOMY    . TONSILLECTOMY  1988   Social History   Occupational History  . Not on file  Tobacco Use  . Smoking status: Never Smoker  . Smokeless tobacco: Never Used  Substance and Sexual Activity  . Alcohol use: No  . Drug use: No  . Sexual activity: Not on file

## 2019-03-03 LAB — URIC ACID: Uric Acid, Serum: 3.9 mg/dL — ABNORMAL LOW (ref 4.0–8.0)

## 2019-03-09 ENCOUNTER — Other Ambulatory Visit: Payer: Self-pay

## 2019-03-09 MED ORDER — LOSARTAN POTASSIUM 25 MG PO TABS: 25.0000 mg | ORAL_TABLET | Freq: Every evening | ORAL | 2 refills | Status: DC

## 2019-03-26 ENCOUNTER — Telehealth (INDEPENDENT_AMBULATORY_CARE_PROVIDER_SITE_OTHER): Payer: Self-pay | Admitting: Radiology

## 2019-03-26 NOTE — Telephone Encounter (Signed)
Called and spoke with patients wife, Martin Stephenson, she answered NO to all pre screening questions in regards to her husband for his appointment on 4/6

## 2019-03-30 ENCOUNTER — Ambulatory Visit (INDEPENDENT_AMBULATORY_CARE_PROVIDER_SITE_OTHER): Payer: Medicare Other | Admitting: Orthopedic Surgery

## 2019-04-27 ENCOUNTER — Ambulatory Visit: Payer: Self-pay | Admitting: Orthopedic Surgery

## 2019-07-02 ENCOUNTER — Telehealth: Payer: Self-pay

## 2019-07-02 ENCOUNTER — Other Ambulatory Visit: Payer: Self-pay | Admitting: Cardiology

## 2019-07-02 DIAGNOSIS — I10 Essential (primary) hypertension: Secondary | ICD-10-CM

## 2019-07-02 MED ORDER — METOPROLOL SUCCINATE ER 100 MG PO TB24: ORAL_TABLET | ORAL | 1 refills | Status: DC

## 2019-07-02 MED ORDER — METOPROLOL SUCCINATE ER 25 MG PO TB24: ORAL_TABLET | ORAL | 1 refills | Status: DC

## 2019-07-02 NOTE — Telephone Encounter (Signed)
Pt request refill on metoprolol succ er 100mg 

## 2019-07-21 NOTE — Telephone Encounter (Signed)
error 

## 2019-10-20 ENCOUNTER — Encounter (INDEPENDENT_AMBULATORY_CARE_PROVIDER_SITE_OTHER): Payer: Self-pay

## 2019-12-09 ENCOUNTER — Other Ambulatory Visit: Payer: Self-pay | Admitting: Cardiology

## 2019-12-09 DIAGNOSIS — I10 Essential (primary) hypertension: Secondary | ICD-10-CM

## 2019-12-23 ENCOUNTER — Other Ambulatory Visit: Payer: Self-pay | Admitting: Cardiology

## 2019-12-23 DIAGNOSIS — I10 Essential (primary) hypertension: Secondary | ICD-10-CM

## 2020-04-12 ENCOUNTER — Other Ambulatory Visit: Payer: Self-pay | Admitting: Cardiology

## 2020-04-12 DIAGNOSIS — I10 Essential (primary) hypertension: Secondary | ICD-10-CM

## 2020-06-30 ENCOUNTER — Other Ambulatory Visit: Payer: Self-pay | Admitting: Cardiology

## 2020-06-30 DIAGNOSIS — I10 Essential (primary) hypertension: Secondary | ICD-10-CM

## 2020-09-25 ENCOUNTER — Other Ambulatory Visit: Payer: Self-pay | Admitting: Cardiology

## 2020-09-25 DIAGNOSIS — I10 Essential (primary) hypertension: Secondary | ICD-10-CM

## 2020-12-23 ENCOUNTER — Other Ambulatory Visit: Payer: Self-pay | Admitting: Cardiology

## 2020-12-26 ENCOUNTER — Other Ambulatory Visit: Payer: Self-pay | Admitting: Cardiology

## 2020-12-26 DIAGNOSIS — I10 Essential (primary) hypertension: Secondary | ICD-10-CM

## 2021-01-13 ENCOUNTER — Other Ambulatory Visit: Payer: Self-pay | Admitting: Cardiology

## 2021-01-13 DIAGNOSIS — I10 Essential (primary) hypertension: Secondary | ICD-10-CM

## 2021-01-23 ENCOUNTER — Other Ambulatory Visit: Payer: Self-pay | Admitting: Cardiology

## 2021-01-30 ENCOUNTER — Encounter: Payer: Self-pay | Admitting: Cardiology

## 2021-01-30 ENCOUNTER — Other Ambulatory Visit: Payer: Self-pay

## 2021-01-30 ENCOUNTER — Ambulatory Visit: Payer: Medicare Other | Admitting: Cardiology

## 2021-01-30 VITALS — BP 140/70 | HR 49 | Temp 97.5°F | Resp 16 | Ht 76.0 in | Wt 249.0 lb

## 2021-01-30 DIAGNOSIS — I2510 Atherosclerotic heart disease of native coronary artery without angina pectoris: Secondary | ICD-10-CM

## 2021-01-30 DIAGNOSIS — I10 Essential (primary) hypertension: Secondary | ICD-10-CM

## 2021-01-30 DIAGNOSIS — Z77098 Contact with and (suspected) exposure to other hazardous, chiefly nonmedicinal, chemicals: Secondary | ICD-10-CM

## 2021-01-30 DIAGNOSIS — R0600 Dyspnea, unspecified: Secondary | ICD-10-CM

## 2021-01-30 DIAGNOSIS — N1831 Chronic kidney disease, stage 3a: Secondary | ICD-10-CM

## 2021-01-30 DIAGNOSIS — R0609 Other forms of dyspnea: Secondary | ICD-10-CM

## 2021-01-30 DIAGNOSIS — R001 Bradycardia, unspecified: Secondary | ICD-10-CM

## 2021-01-30 MED ORDER — LABETALOL HCL 100 MG PO TABS: 100.0000 mg | ORAL_TABLET | Freq: Two times a day (BID) | ORAL | 2 refills | Status: DC

## 2021-01-30 NOTE — Progress Notes (Signed)
Primary Physician/Referring:  Tisovec, Fransico Him, MD  Patient ID: Martin Stephenson, male    DOB: [birth year], 80 y.o.   MRN: [id number]  Chief Complaint  Patient presents with  . Edema  . Shortness of Breath  . New Patient (Initial Visit)   HPI:    Martin Stephenson  is a 80 y.o. 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 Leg edema is stable.   Over the past few months he has noticed marked dyspnea on exertion doing even minimal activities.  He has been very concerned as his activity level has reduced so quickly and his children have been concerned as well.  No PND or orthopnea.  No associated chest tightness or chest discomfort.  States that his leg edema has remained stable and he has been compliant with wearing support stockings.  He is accompanied by his wife.  Past Medical History:  Diagnosis Date  . Allergy   . Anemia   . Arthritis    foot  . Congestive heart failure ([place])   . Constipation    uses mag citrate OTC if needed  . GERD (gastroesophageal reflux disease)   . Hiatal hernia   . Hyperlipidemia   . Hypertension   . Kidney stone   . Sleep apnea    wears cpap   Past Surgical History:  Procedure Laterality Date  . COLONOSCOPY    . HAMMER TOE SURGERY  1998   1st toe right foot  . hydrocelectomy  1988  . POLYPECTOMY    . TONSILLECTOMY  1988   Family History  Problem Relation Age of Onset  . Colon cancer Mother   . Stomach cancer Neg Hx   . Colon polyps Neg Hx   . Esophageal cancer Neg Hx   . Rectal cancer Neg Hx     Social History   Tobacco Use  . Smoking status: Never Smoker  . Smokeless tobacco: Never Used  Substance Use Topics  . Alcohol use: No   Marital Status: Married  ROS  Review  of Systems  Cardiovascular: Positive for dyspnea on exertion and leg swelling (chronic and uses support stockings). Negative for chest pain.  Musculoskeletal: Positive for joint pain (left knee > right).  Gastrointestinal: Negative for melena.   Objective  Blood pressure 140/70, pulse (!) 49, temperature (!) 97.5 F (36.4 C), temperature source Temporal, resp. rate 16, height 6' 4"  (1.93 m), weight 249 lb (112.9 kg), SpO2 96 %.  Vitals with BMI 01/30/2021 01/30/2021 03/02/2019  Height - 6' 4"  6' 4"   Weight - 249 lbs 248 lbs  BMI - 49.44 96.7  Systolic 591 638 -  Diastolic 70 73 -  Pulse - 49 -     Physical Exam Cardiovascular:     Rate and Rhythm: Regular rhythm. Bradycardia present.     Pulses: Intact distal pulses.     Heart sounds: Normal heart sounds. No murmur heard. No gallop.      Comments: 2+ bilateral pitting leg edema, no JVD. Pulmonary:     Effort: Pulmonary effort is normal.     Breath sounds: Normal breath sounds.  Abdominal:     General: Bowel sounds are normal.     Palpations: Abdomen is soft.    Laboratory  examination:   No results for input(s): NA, K, CL, CO2, GLUCOSE, BUN, CREATININE, CALCIUM, GFRNONAA, GFRAA in the last 8760 hours. CrCl cannot be calculated (Patient's most recent lab result is older than the maximum 21 days allowed.).  CMP Latest Ref Rng & Units 10/19/2017 03/20/2017 02/21/2017  Glucose 65 - 99 mg/dL 120(H) - 99  BUN 6 - 20 mg/dL 17 - 16  Creatinine 0.61 - 1.24 mg/dL 1.61(H) - 1.46(H)  Sodium 135 - 145 mmol/L 141 - 139  Potassium 3.5 - 5.1 mmol/L 4.0 - 4.1  Chloride 101 - 111 mmol/L 105 - 105  CO2 22 - 32 mmol/L 26 - 26  Calcium 8.9 - 10.3 mg/dL 9.2 - 9.5  Total Protein 6.0 - 8.3 g/dL - 7.2 -  Total Bilirubin 0.2 - 1.2 mg/dL - 0.5 -  Alkaline Phos 39 - 117 U/L - 60 -  AST 0 - 37 U/L - 17 -  ALT 0 - 53 U/L - 20 -   CBC Latest Ref Rng & Units 10/19/2017 03/20/2017 02/21/2017  WBC 4.0 - 10.5 K/uL 3.9(L) 4.1 8.1  Hemoglobin 13.0 - 17.0 g/dL  12.1(L) 12.8(L) 11.5(L)  Hematocrit 39.0 - 52.0 % 36.8(L) 39.0 35.3(L)  Platelets 150 - 400 K/uL 214 181.0 216    Lipid Panel No results for input(s): CHOL, TRIG, LDLCALC, VLDL, HDL, CHOLHDL, LDLDIRECT in the last 8760 hours.  HEMOGLOBIN A1C No results found for: HGBA1C, MPG TSH No results for input(s): TSH in the last 8760 hours.  External labs:   Labs 12/26/2020:  BUN 24, serum glucose 106 mg, sodium 143, potassium 4.4, creatinine 1.47 EGFR 56 mL, CMP otherwise normal.  Uric acid normal at 4.1.  Hemoglobin 11.2/hematocrit 34.0, normal indicis.  Platelets 190.  Cholesterol, total 119.000 m 06/15/2020 HDL 36 MG/DL 06/15/2020 LDL 65.000 mg 06/15/2020 Triglycerides 88.000 06/15/2020   Hemoglobin 12.100 g/d 06/15/2020  Creatinine, Serum 1.300 mg/ 06/15/2020 Potassium 4.100 MM 01/24/2017 ALT (SGPT) 14.000 uni 06/15/2020  Medications and allergies   Allergies  Allergen Reactions  . Aleve [Naproxen Sodium]     Patient has been instructed to avoid Ibuprofen b/c of his Creatinine level.  . Ibuprofen Other (See Comments)    Patient has been instructed to avoid Ibuprofen b/c of his Creatinine level.  Marland Kitchen Furacin [Nitrofurazone] Rash     Outpatient Medications Prior to Visit  Medication Sig Dispense Refill  . allopurinol (ZYLOPRIM) 300 MG tablet Take 300 mg by mouth daily.     Marland Kitchen aspirin 81 MG tablet Take 81 mg by mouth daily.    . Calcium Carb-Cholecalciferol (CALCIUM 600/VITAMIN D3 PO) Take 1 tablet by mouth daily.     . ferrous sulfate 325 (65 FE) MG tablet Take 3 tablets by mouth once a week.    . losartan (COZAAR) 25 MG tablet TAKE 1 TABLET BY MOUTH EVERY DAY IN THE EVENING 90 tablet 1  . rosuvastatin (CRESTOR) 10 MG tablet Take 10 mg by mouth every Monday, Wednesday, and Friday.    . torsemide (DEMADEX) 20 MG tablet Take 20 mg by mouth daily.  6  . metoprolol succinate (TOPROL-XL) 100 MG 24 hr tablet TAKE 1 TABLET BY MOUTH EVERY DAY 90 tablet 0  . predniSONE (DELTASONE) 10 MG  tablet Take 2 tablets (20 mg total) by mouth daily with breakfast. 60 tablet 1  . 0.9 %  sodium chloride infusion      No facility-administered medications prior to visit.    Radiology:   Lower extremity venous insufficiency study  02/27/2017: 1. Normal bilateral lower extremity deep venous systems. No evidence of acute or chronic DVT. 2. Unremarkable bilateral lower extremity saphenous venous systems. No evidence of valvular incompetence, reflux, or significant varicose vein disease.  CT angiogram chest 03/22/2017: Cardiovascular: Heart size upper normal. No pericardial effusion. Coronary artery calcification is noted.  Atherosclerotic calcification is noted in the wall of the thoracic aorta.  No filling defect in the opacified pulmonary arteries to suggest the presence of an acute pulmonary embolus.  Cardiac Studies:   Heart Cath  [2007]: at [facility] hosp: Normal coronary arteries.  [facility] Severe sleep apnea  Treadmill stress test  [01/04/2017]:  Indication: DyspneaResting EKG demonstrates NSR. The patient exercised according to Bruce Protocol, Total time recorded 06:34 min achieving max heart rate of 143 which was 97% of THR for age and 7.05 METS of work. Hypertensive BP response with peak BP of 242/72 mm Hg. There was no ST-T changes of ischemia with exercise stress test. There were no significant arrhythmias. Stress terminated due to THR (>85% MPHR)/MPHR met. Normal BP response. Normal exercise tolerence.  Echocardiogram  [12/19/2016]:  Left ventricle cavity is normal in size. Moderate concentric hypertrophy of the left ventricle. Normal global wall motion. Normal diastolic filling pattern. Calculated EF 55%. Mild aortic regurgitation. Cannot exclude mild MVP. Mild to Moderate (Grade III) mitral regurgitation. Trace tricuspid regurgitation. Unable to estimate PA pressure due to absence/minimal TR signal.   EKG:     EKG 01/30/2021: Sinus bradycardia at rate  of 48 bpm, otherwise normal EKG.  Compared to 2018, heart rate was 52 bpm.  Assessment     ICD-10-CM   1. Dyspnea on exertion  R06.00 PCV MYOCARDIAL PERFUSION WO LEXISCAN    PCV ECHOCARDIOGRAM COMPLETE  2. Coronary artery calcification seen on CAT scan  I25.10 PCV MYOCARDIAL PERFUSION WO LEXISCAN    PCV ECHOCARDIOGRAM COMPLETE  3. Essential hypertension  I10 EKG 12-Lead    labetalol (NORMODYNE) 100 MG tablet  4. Stage 3a chronic kidney disease (HCC)  N18.31   5. Bradycardia by electrocardiogram  R00.1   6. 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 predniSONE (DELTASONE) 10 MG tablet Completed Course  . 0.9 %  sodium chloride infusion Error  . metoprolol succinate (TOPROL-XL) 100 MG 24 hr tablet Discontinued by provider  . metoprolol succinate (TOPROL-XL) 100 MG 24 hr tablet Discontinued by provider    Meds ordered this encounter  Medications  . labetalol (NORMODYNE) 100 MG tablet    Sig: Take 1 tablet (100 mg total) by mouth 2 (two) times daily.    Dispense:  60 tablet    Refill:  2   Orders Placed This Encounter  Procedures  . PCV MYOCARDIAL PERFUSION WO LEXISCAN    Standing Status:   Future    Standing Expiration Date:   03/30/2021  . EKG 12-Lead  . PCV ECHOCARDIOGRAM COMPLETE    Standing Status:   Future    Standing Expiration Date:   01/30/2022    Recommendations:   Martin Stephenson is a 47 y.o. 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 Leg edema is stable.   I have reviewed his external  records, lipids are well controlled, blood pressure is slightly elevated, he continues to have marked bradycardia.  He is on high dose of metoprolol  succinate at 100 mg daily, will change to labetalol 100 mg p.o. twice daily to see whether we will be able to control his blood pressure better and also improve his heart rate which may help with increased cardiac reserve and may notice improved dyspnea as well.  I reviewed his external labs, prior studies that were done including CT scan of the chest which reveals aortic atherosclerosis and coronary calcification, in view of chronic renal insufficiency, his risk factors including hypertension and hyperlipidemia, I recommended nuclear stress test and an echocardiogram.  He also has history of agent orange exposure and cardiomyopathy and leg edema and heart failure need to be evaluated further.  I will see him back in 4 to 6 weeks for follow-up.    [name], MD, [facility] 01/30/2021, 10:39 AM Office: [phone]

## 2021-02-07 ENCOUNTER — Other Ambulatory Visit: Payer: Self-pay | Admitting: Rehabilitation

## 2021-02-07 DIAGNOSIS — M4712 Other spondylosis with myelopathy, cervical region: Secondary | ICD-10-CM

## 2021-02-13 ENCOUNTER — Other Ambulatory Visit: Payer: Self-pay

## 2021-02-13 ENCOUNTER — Ambulatory Visit: Payer: Medicare Other

## 2021-02-13 DIAGNOSIS — R0609 Other forms of dyspnea: Secondary | ICD-10-CM

## 2021-02-13 DIAGNOSIS — R0600 Dyspnea, unspecified: Secondary | ICD-10-CM

## 2021-02-13 DIAGNOSIS — I2510 Atherosclerotic heart disease of native coronary artery without angina pectoris: Secondary | ICD-10-CM

## 2021-02-17 ENCOUNTER — Ambulatory Visit: Payer: Medicare Other

## 2021-02-17 ENCOUNTER — Other Ambulatory Visit: Payer: Self-pay

## 2021-02-17 DIAGNOSIS — I2510 Atherosclerotic heart disease of native coronary artery without angina pectoris: Secondary | ICD-10-CM

## 2021-02-17 DIAGNOSIS — R0609 Other forms of dyspnea: Secondary | ICD-10-CM

## 2021-02-17 DIAGNOSIS — R0600 Dyspnea, unspecified: Secondary | ICD-10-CM

## 2021-02-22 ENCOUNTER — Other Ambulatory Visit: Payer: Self-pay

## 2021-02-22 ENCOUNTER — Ambulatory Visit
Admission: RE | Admit: 2021-02-22 | Discharge: 2021-02-22 | Disposition: A | Discharge status: Home / Self Care | Payer: Medicare Other | Source: Ambulatory Visit | Attending: Rehabilitation | Admitting: Rehabilitation

## 2021-02-22 DIAGNOSIS — M4712 Other spondylosis with myelopathy, cervical region: Secondary | ICD-10-CM

## 2021-03-06 ENCOUNTER — Other Ambulatory Visit: Payer: Self-pay

## 2021-03-06 ENCOUNTER — Encounter: Payer: Self-pay | Admitting: Cardiology

## 2021-03-06 ENCOUNTER — Ambulatory Visit: Payer: Medicare Other | Admitting: Cardiology

## 2021-03-06 VITALS — BP 154/83 | HR 74 | Temp 97.4°F | Resp 17 | Ht 76.0 in | Wt 246.6 lb

## 2021-03-06 DIAGNOSIS — Z01810 Encounter for preprocedural cardiovascular examination: Secondary | ICD-10-CM

## 2021-03-06 DIAGNOSIS — R0600 Dyspnea, unspecified: Secondary | ICD-10-CM

## 2021-03-06 DIAGNOSIS — I428 Other cardiomyopathies: Secondary | ICD-10-CM

## 2021-03-06 DIAGNOSIS — R0609 Other forms of dyspnea: Secondary | ICD-10-CM

## 2021-03-06 DIAGNOSIS — N1831 Chronic kidney disease, stage 3a: Secondary | ICD-10-CM

## 2021-03-06 DIAGNOSIS — I10 Essential (primary) hypertension: Secondary | ICD-10-CM

## 2021-03-06 DIAGNOSIS — R9439 Abnormal result of other cardiovascular function study: Secondary | ICD-10-CM

## 2021-03-06 MED ORDER — AMLODIPINE BESYLATE 5 MG PO TABS: 5.0000 mg | ORAL_TABLET | Freq: Every day | ORAL | 2 refills | Status: DC

## 2021-03-06 NOTE — H&P (View-Only) (Signed)
Primary Physician/Referring:  Tisovec, Fransico Him, MD  Patient ID: Martin Stephenson, male    DOB: 1941/09/02, 80 y.o.   MRN: [id number]  Chief Complaint  Patient presents with  . Follow-up    4 WEEK  . Shortness of Breath  . Hypertension   HPI:    Martin Stephenson  is a 80 y.o. 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 Leg edema is stable.   Over the past few months he has noticed marked dyspnea on exertion doing even minimal activities.  He has been very concerned as his activity level has reduced so quickly and his children have been concerned as well.  No PND or orthopnea.  No associated chest tightness or chest discomfort.  States that his leg edema has remained stable and he has been compliant with wearing support stockings.  He is accompanied by his wife.  He underwent echocardiogram and stress test.  Past Medical History:  Diagnosis Date  . Allergy   . Anemia   . Arthritis    foot  . Congestive heart failure ([place])   . Constipation    uses mag citrate OTC if needed  . GERD (gastroesophageal reflux disease)   . Hiatal hernia   . Hyperlipidemia   . Hypertension   . Kidney stone   . Sleep apnea    wears cpap   Past Surgical History:  Procedure Laterality Date  . COLONOSCOPY    . HAMMER TOE SURGERY  1998   1st toe right foot  . hydrocelectomy  1988  . POLYPECTOMY    . TONSILLECTOMY  1988   Family History  Problem Relation Age of Onset  . Colon cancer Mother   . Stomach cancer Neg Hx   . Colon polyps Neg Hx   . Esophageal cancer Neg Hx   . Rectal cancer Neg Hx     Social History   Tobacco Use  . Smoking status: Never Smoker  . Smokeless tobacco: Never Used  Substance Use Topics  . Alcohol use: No    Marital Status: Married  ROS  Review of Systems  Cardiovascular: Positive for dyspnea on exertion and leg swelling (chronic and uses support stockings). Negative for chest pain.  Musculoskeletal: Positive for joint pain (left knee > right).  Gastrointestinal: Negative for melena.   Objective  Blood pressure (!) 154/83, pulse 74, temperature (!) 97.4 F (36.3 C), temperature source Temporal, resp. rate 17, height 6' 4"  (1.93 m), weight 246 lb 9.6 oz (111.9 kg), SpO2 98 %.  Vitals with BMI 03/06/2021 03/06/2021 01/30/2021  Height - 6' 4"  -  Weight - 246 lbs 10 oz -  BMI - 10.93 -  Systolic 235 573 220  Diastolic 83 94 70  Pulse 74 69 -     Physical Exam Cardiovascular:     Rate and Rhythm: Regular rhythm. Bradycardia present.     Pulses: Intact distal pulses.     Heart sounds: Normal heart sounds. No murmur heard. No gallop.      Comments: 2+ bilateral pitting leg edema, no JVD. Pulmonary:     Effort: Pulmonary effort is normal.     Breath sounds: Normal breath sounds.  Abdominal:     General: Bowel sounds are normal.     Palpations:  Abdomen is soft.    Laboratory examination:   Recent Labs    03/06/21 1557  NA 142  K 5.0  CL 106  CO2 23  GLUCOSE 82  BUN 18  CREATININE 1.41*  CALCIUM 9.5   estimated creatinine clearance is 60.1 mL/min (A) (by C-G formula based on SCr of 1.41 mg/dL (H)).  CMP Latest Ref Rng & Units 03/06/2021 10/19/2017 03/20/2017  Glucose 65 - 99 mg/dL 82 120(H) -  BUN 8 - 27 mg/dL 18 17 -  Creatinine 0.76 - 1.27 mg/dL 1.41(H) 1.61(H) -  Sodium 134 - 144 mmol/L 142 141 -  Potassium 3.5 - 5.2 mmol/L 5.0 4.0 -  Chloride 96 - 106 mmol/L 106 105 -  CO2 20 - 29 mmol/L 23 26 -  Calcium 8.6 - 10.2 mg/dL 9.5 9.2 -  Total Protein 6.0 - 8.3 g/dL - - 7.2  Total Bilirubin 0.2 - 1.2 mg/dL - - 0.5  Alkaline Phos 39 - 117 U/L - - 60  AST 0 - 37 U/L - - 17  ALT 0 - 53 U/L - - 20   CBC Latest Ref Rng & Units 03/06/2021 10/19/2017 03/20/2017  WBC 3.4 - 10.8  x10E3/uL 4.7 3.9(L) 4.1  Hemoglobin 13.0 - 17.7 g/dL 12.2(L) 12.1(L) 12.8(L)  Hematocrit 37.5 - 51.0 % 36.0(L) 36.8(L) 39.0  Platelets 150 - 450 x10E3/uL 186 214 181.0   ProBNP    Component Value Date/Time   PROBNP 85 03/06/2021 1557   PROBNP 77.0 03/20/2017 0957    TSH No results for input(s): TSH in the last 8760 hours.  External labs:   Labs 12/26/2020:  BUN 24, serum glucose 106 mg, sodium 143, potassium 4.4, creatinine 1.47 EGFR 56 mL, CMP otherwise normal.  Uric acid normal at 4.1.  Hemoglobin 11.2/hematocrit 34.0, normal indicis.  Platelets 190.  Cholesterol, total 119.000 m 06/15/2020 HDL 36 MG/DL 06/15/2020 LDL 65.000 mg 06/15/2020 Triglycerides 88.000 06/15/2020   Hemoglobin 12.100 g/d 06/15/2020  Creatinine, Serum 1.300 mg/ 06/15/2020 Potassium 4.100 MM 01/24/2017 ALT (SGPT) 14.000 uni 06/15/2020  Medications and allergies   Allergies  Allergen Reactions  . Aleve [Naproxen Sodium]     Patient has been instructed to avoid Ibuprofen b/c of his Creatinine level.  . Ibuprofen Other (See Comments)    Patient has been instructed to avoid Ibuprofen b/c of his Creatinine level.  Marland Kitchen Furacin [Nitrofurazone] Rash     Outpatient Medications Prior to Visit  Medication Sig Dispense Refill  . allopurinol (ZYLOPRIM) 300 MG tablet Take 300 mg by mouth daily.     Marland Kitchen aspirin 81 MG tablet Take 81 mg by mouth daily.    . Calcium Carb-Cholecalciferol (CALCIUM 600/VITAMIN D3 PO) Take 1 tablet by mouth daily.     . ferrous sulfate 325 (65 FE) MG tablet Take 3 tablets by mouth once a week.    . labetalol (NORMODYNE) 100 MG tablet Take 1 tablet (100 mg total) by mouth 2 (two) times daily. 60 tablet 2  . losartan (COZAAR) 25 MG tablet TAKE 1 TABLET BY MOUTH EVERY DAY IN THE EVENING 90 tablet 1  . rosuvastatin (CRESTOR) 10 MG tablet Take 10 mg by mouth every Monday, Wednesday, and Friday.    . torsemide (DEMADEX) 20 MG tablet Take 20 mg by mouth as needed.  6   No facility-administered  medications prior to visit.   Radiology:   Lower extremity venous insufficiency study 02/27/2017: 1. Normal bilateral lower extremity deep venous systems. No evidence of acute or chronic  DVT. 2. Unremarkable bilateral lower extremity saphenous venous systems. No evidence of valvular incompetence, reflux, or significant varicose vein disease.  CT angiogram chest 03/22/2017: Cardiovascular: Heart size upper normal. No pericardial effusion. Coronary artery calcification is noted.  Atherosclerotic calcification is noted in the wall of the thoracic aorta.  No filling defect in the opacified pulmonary arteries to suggest the presence of an acute pulmonary embolus.  MRI neck 02/22/2021:  Multilevel degenerative change in the cervical spine. 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  Cardiac Studies:   Heart Cath  [2007]: at [facility] hosp: Normal coronary arteries.  [facility]  [[facility]]: Severe sleep apnea  Exercise Myoview stress test 02/13/2021: Exercise nuclear stress test was performed using Bruce protocol. Patient reached 7 METS, and 101% of age predicted maximum heart rate. Exercise capacity was low. Non-limiting chest pain reported. Heart rate and hemodynamic response were normal. Peak EKG demonstrated sinus tachycardia, 2-3 mm horizontal/down-sloping ST depressions in leads II, III, aVF, V4-V6, partially normalize 2 min into recovery. SPECT images showed medium sized, mild intensity, mildly reversible perfusion defect in basal-mid inferoseptal, inferior, inferolateral myocardium. Stress LVEF is calculated 44%, although visually appears normal.  Intermediate risk study.   Compared to treadmill stress test in 2018, no significant change in exercise capacity although there were no ST-T wave changes of ischemia.  Echocardiogram 02/17/2021: Left  ventricle cavity is normal in size. Moderate concentric hypertrophy of the left ventricle. Mild global hypokinesis. LVEF 45-50%. Normal global wall motion. Diastolic function not assessed due to severity of mitral regurgitation.  Left atrial cavity is severely dilated. Trileaflet aortic valve.  Moderate (Grade II) aortic regurgitation. Moderate (Grade III) mitral regurgitation. Mild tricuspid regurgitation. No evidence of pulmonary hypertension. Compared to previous study in 2017, LVEF is mildly reduced from 50-55%.    EKG:     EKG 01/30/2021: Sinus bradycardia at rate of 48 bpm, otherwise normal EKG.  Compared to 2018, heart rate was 52 bpm.  Assessment     ICD-10-CM   1. Dyspnea on exertion  R06.00   2. Abnormal nuclear stress test  R94.39   3. Other cardiomyopathy ([place])  M62.8 Basic metabolic panel    CBC    Pro b natriuretic peptide (BNP)  4. Essential hypertension  I10 amLODipine (NORVASC) 5 MG tablet  5. Preoperative cardiovascular examination Cervical spine surgery _ Lorenza Burton, MD 03/2021  Z01.810     There are no discontinued medications.  Meds ordered this encounter  Medications  . amLODipine (NORVASC) 5 MG tablet    Sig: Take 1 tablet (5 mg total) by mouth daily.    Dispense:  30 tablet    Refill:  2   Orders Placed This Encounter  Procedures  . Basic metabolic panel  . CBC  . Pro b natriuretic peptide (BNP)   Recommendations:   Martin Stephenson is a 72 y.o. 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 Leg edema is stable.   Over the past few months he has noticed marked dyspnea on exertion doing even minimal activities.  On his last office visit I had discontinued high-dose metoprolol and switch him to  labetalol 100 mg twice  daily due to marked sinus bradycardia at 49 bpm.  Heart rate has improved.  Obviously blood pressure still is uncontrolled.  I will start him on amlodipine 5 mg daily.  Will watch out for his leg edema that is chronic, presently wearing support stockings.  I reviewed the results of the echocardiogram and stress test.  I am concerned that he may have significant underlying CAD or a component of nonischemic cardiomyopathy related to either hypertensive heart disease or "agent orange" exposure.  He needs left and right heart catheterization to evaluate for his symptoms of dyspnea and also CAD.  He has been diagnosed with severe cervical spine stenosis and needs decompression surgery, hence during catheterization, unless I find major vessel disease or significant proximal/ostial disease in the main vessels, small vessels will not be treated.  Also patient is aware that in case he needs PCI, his surgery needs to be postponed unless emergent for at least 3 months otherwise can proceed after 1 month if urgent.  Schedule for cardiac catheterization, and possible angioplasty. We discussed regarding risks, benefits, alternatives to this including stress testing, CTA and continued medical therapy. Patient wants to proceed. Understands <1-2% risk of death, stroke, MI, urgent CABG, bleeding, infection, renal failure but not limited to these.  Office visit following the work-up/investigations.  With routine labs will also obtain a proBNP.  Labs reviewed, proBNP is normal and renal function is stable.  This was a 40-minute encounter with review of his labs, review of his testing and coordination of care.   [name], MD, [facility] 03/07/2021, 5:57 AM Office: [phone]

## 2021-03-06 NOTE — Progress Notes (Addendum)
Primary Physician/Referring:  Tisovec, Fransico Him, MD  Patient ID: Martin Stephenson, male    DOB: 1941/03/29, 80 y.o.   MRN: [id number]  Chief Complaint  Patient presents with   Follow-up    4 WEEK   Shortness of Breath   Hypertension   HPI:    Martin Stephenson  is a 70 y.o. 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 Leg edema is stable.   Over the past few months he has noticed marked dyspnea on exertion doing even minimal activities.  He has been very concerned as his activity level has reduced so quickly and his children have been concerned as well.  No PND or orthopnea.  No associated chest tightness or chest discomfort.  States that his leg edema has remained stable and he has been compliant with wearing support stockings.  He is accompanied by his wife.  He underwent echocardiogram and stress test.  Past Medical History:  Diagnosis Date   Allergy    Anemia    Arthritis    foot   Congestive heart failure (HCC)    Constipation    uses mag citrate OTC if needed   GERD (gastroesophageal reflux disease)    Hiatal hernia    Hyperlipidemia    Hypertension    Kidney stone    Sleep apnea    wears cpap   Past Surgical History:  Procedure Laterality Date   COLONOSCOPY     HAMMER TOE SURGERY  1998   1st toe right foot   hydrocelectomy  [place]   Family History  Problem Relation Age of Onset   Colon cancer Mother    Stomach cancer Neg Hx    Colon polyps Neg Hx    Esophageal cancer Neg Hx    Rectal cancer Neg Hx     Social History   Tobacco Use   Smoking status: Never Smoker   Smokeless tobacco: Never Used  Substance Use Topics   Alcohol use: No    Marital Status: Married  ROS  Review of Systems  Cardiovascular: Positive for dyspnea on exertion and leg swelling (chronic and uses support stockings). Negative for chest pain.  Musculoskeletal: Positive for joint pain (left knee > right).  Gastrointestinal: Negative for melena.   Objective  Blood pressure (!) 154/83, pulse 74, temperature (!) 97.4 F (36.3 C), temperature source Temporal, resp. rate 17, height 6' 4"  (1.93 m), weight 246 lb 9.6 oz (111.9 kg), SpO2 98 %.  Vitals with BMI 03/06/2021 03/06/2021 01/30/2021  Height - 6' 4"  -  Weight - 246 lbs 10 oz -  BMI - 40.98 -  Systolic 119 147 829  Diastolic 83 94 70  Pulse 74 69 -     Physical Exam Cardiovascular:     Rate and Rhythm: Regular rhythm. Bradycardia present.     Pulses: Intact distal pulses.     Heart sounds: Normal heart sounds. No murmur heard. No gallop.      Comments: 2+ bilateral pitting leg edema, no JVD. Pulmonary:     Effort: Pulmonary effort is normal.     Breath sounds: Normal breath sounds.  Abdominal:     General: Bowel sounds are normal.     Palpations:  Abdomen is soft.    Laboratory examination:   Recent Labs    03/06/21 1557  NA 142  K 5.0  CL 106  CO2 23  GLUCOSE 82  BUN 18  CREATININE 1.41*  CALCIUM 9.5   estimated creatinine clearance is 60.1 mL/min (A) (by C-G formula based on SCr of 1.41 mg/dL (H)).  CMP Latest Ref Rng & Units 03/06/2021 10/19/2017 03/20/2017  Glucose 65 - 99 mg/dL 82 120(H) -  BUN 8 - 27 mg/dL 18 17 -  Creatinine 0.76 - 1.27 mg/dL 1.41(H) 1.61(H) -  Sodium 134 - 144 mmol/L 142 141 -  Potassium 3.5 - 5.2 mmol/L 5.0 4.0 -  Chloride 96 - 106 mmol/L 106 105 -  CO2 20 - 29 mmol/L 23 26 -  Calcium 8.6 - 10.2 mg/dL 9.5 9.2 -  Total Protein 6.0 - 8.3 g/dL - - 7.2  Total Bilirubin 0.2 - 1.2 mg/dL - - 0.5  Alkaline Phos 39 - 117 U/L - - 60  AST 0 - 37 U/L - - 17  ALT 0 - 53 U/L - - 20   CBC Latest Ref Rng & Units 03/06/2021 10/19/2017 03/20/2017  WBC 3.4 - 10.8  x10E3/uL 4.7 3.9(L) 4.1  Hemoglobin 13.0 - 17.7 g/dL 12.2(L) 12.1(L) 12.8(L)  Hematocrit 37.5 - 51.0 % 36.0(L) 36.8(L) 39.0  Platelets 150 - 450 x10E3/uL 186 214 181.0   ProBNP    Component Value Date/Time   PROBNP 85 03/06/2021 1557   PROBNP 77.0 03/20/2017 0957    TSH No results for input(s): TSH in the last 8760 hours.  External labs:   Labs 12/26/2020:  BUN 24, serum glucose 106 mg, sodium 143, potassium 4.4, creatinine 1.47 EGFR 56 mL, CMP otherwise normal.  Uric acid normal at 4.1.  Hemoglobin 11.2/hematocrit 34.0, normal indicis.  Platelets 190.  Cholesterol, total 119.000 m 06/15/2020 HDL 36 MG/DL 06/15/2020 LDL 65.000 mg 06/15/2020 Triglycerides 88.000 06/15/2020   Hemoglobin 12.100 g/d 06/15/2020  Creatinine, Serum 1.300 mg/ 06/15/2020 Potassium 4.100 MM 01/24/2017 ALT (SGPT) 14.000 uni 06/15/2020  Medications and allergies   Allergies  Allergen Reactions   Aleve [Naproxen Sodium]     Patient has been instructed to avoid Ibuprofen b/c of his Creatinine level.   Ibuprofen Other (See Comments)    Patient has been instructed to avoid Ibuprofen b/c of his Creatinine level.   Furacin [Nitrofurazone] Rash     Outpatient Medications Prior to Visit  Medication Sig Dispense Refill   allopurinol (ZYLOPRIM) 300 MG tablet Take 300 mg by mouth daily.      aspirin 81 MG tablet Take 81 mg by mouth daily.     Calcium Carb-Cholecalciferol (CALCIUM 600/VITAMIN D3 PO) Take 1 tablet by mouth daily.      ferrous sulfate 325 (65 FE) MG tablet Take 3 tablets by mouth once a week.     labetalol (NORMODYNE) 100 MG tablet Take 1 tablet (100 mg total) by mouth 2 (two) times daily. 60 tablet 2   losartan (COZAAR) 25 MG tablet TAKE 1 TABLET BY MOUTH EVERY DAY IN THE EVENING 90 tablet 1   rosuvastatin (CRESTOR) 10 MG tablet Take 10 mg by mouth every Monday, Wednesday, and Friday.     torsemide (DEMADEX) 20 MG tablet Take 20 mg by mouth as needed.  6   No facility-administered  medications prior to visit.   Radiology:   Lower extremity venous insufficiency study 02/27/2017: 1. Normal bilateral lower extremity deep venous systems. No evidence of acute or chronic  DVT. 2. Unremarkable bilateral lower extremity saphenous venous systems. No evidence of valvular incompetence, reflux, or significant varicose vein disease.  CT angiogram chest 03/22/2017: Cardiovascular: Heart size upper normal. No pericardial effusion. Coronary artery calcification is noted.  Atherosclerotic calcification is noted in the wall of the thoracic aorta.  No filling defect in the opacified pulmonary arteries to suggest the presence of an acute pulmonary embolus.  MRI neck 02/22/2021:  Multilevel degenerative change in the cervical spine. 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  Cardiac Studies:   Heart Cath  [2007]: at [facility] hosp: Normal coronary arteries.  Sleep Study  [[facility] at [facility]]: Severe sleep apnea  Exercise Myoview stress test 02/13/2021: Exercise nuclear stress test was performed using Bruce protocol. Patient reached 7 METS, and 101% of age predicted maximum heart rate. Exercise capacity was low. Non-limiting chest pain reported. Heart rate and hemodynamic response were normal. Peak EKG demonstrated sinus tachycardia, 2-3 mm horizontal/down-sloping ST depressions in leads II, III, aVF, V4-V6, partially normalize 2 min into recovery. SPECT images showed medium sized, mild intensity, mildly reversible perfusion defect in basal-mid inferoseptal, inferior, inferolateral myocardium. Stress LVEF is calculated 44%, although visually appears normal.  Intermediate risk study.   Compared to treadmill stress test in 2018, no significant change in exercise capacity although there were no ST-T wave changes of ischemia.  Echocardiogram 02/17/2021: Left  ventricle cavity is normal in size. Moderate concentric hypertrophy of the left ventricle. Mild global hypokinesis. LVEF 45-50%. Normal global wall motion. Diastolic function not assessed due to severity of mitral regurgitation.  Left atrial cavity is severely dilated. Trileaflet aortic valve.  Moderate (Grade II) aortic regurgitation. Moderate (Grade III) mitral regurgitation. Mild tricuspid regurgitation. No evidence of pulmonary hypertension. Compared to previous study in 2017, LVEF is mildly reduced from 50-55%.    EKG:     EKG 01/30/2021: Sinus bradycardia at rate of 48 bpm, otherwise normal EKG.  Compared to 2018, heart rate was 52 bpm.  Assessment     ICD-10-CM   1. Dyspnea on exertion  R06.00   2. 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 Essential hypertension  I10 amLODipine (NORVASC) 5 MG tablet  5. Preoperative cardiovascular examination Cervical spine surgery _ Lorenza Burton, MD 03/2021  Z01.810     There are no discontinued medications.  Meds ordered this encounter  Medications   amLODipine (NORVASC) 5 MG tablet    Sig: Take 1 tablet (5 mg total) by mouth daily.    Dispense:  30 tablet    Refill:  2   Orders Placed This Encounter  Procedures   Basic metabolic panel   CBC   Pro b natriuretic peptide (BNP)   Recommendations:   BURREL LEGRAND is a 80 y.o. 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 Leg edema is stable.   Over the past few months he has noticed marked dyspnea on exertion doing even minimal activities.  On his last office visit I had discontinued high-dose metoprolol and switch him to  labetalol 100 mg twice  daily due to marked sinus bradycardia at 49 bpm.  Heart rate has improved.  Obviously blood pressure still is uncontrolled.  I will start him on amlodipine 5 mg daily.  Will watch out for his leg edema that is chronic, presently wearing support stockings.  I reviewed the results of the echocardiogram and stress test.  I am concerned that he may have significant underlying CAD or a component of nonischemic cardiomyopathy related to either hypertensive heart disease or "agent orange" exposure.  He needs left and right heart catheterization to evaluate for his symptoms of dyspnea and also CAD.  He has been diagnosed with severe cervical spine stenosis and needs decompression surgery, hence during catheterization, unless I find major vessel disease or significant proximal/ostial disease in the main vessels, small vessels will not be treated.  Also patient is aware that in case he needs PCI, his surgery needs to be postponed unless emergent for at least 3 months otherwise can proceed after 1 month if urgent.  Schedule for cardiac catheterization, and possible angioplasty. We discussed regarding risks, benefits, alternatives to this including stress testing, CTA and continued medical therapy. Patient wants to proceed. Understands <1-2% risk of death, stroke, MI, urgent CABG, bleeding, infection, renal failure but not limited to these.  Office visit following the work-up/investigations.  With routine labs will also obtain a proBNP.  Labs reviewed, proBNP is normal and renal function is stable.  This was a 40-minute encounter with review of his labs, review of his testing and coordination of care.   [name], MD, [facility] 03/07/2021, 5:57 AM Office: [phone]

## 2021-03-07 LAB — BASIC METABOLIC PANEL
BUN/Creatinine Ratio: 13 (ref 10–24)
BUN: 18 mg/dL (ref 8–27)
CO2: 23 mmol/L (ref 20–29)
Calcium: 9.5 mg/dL (ref 8.6–10.2)
Chloride: 106 mmol/L (ref 96–106)
Creatinine, Ser: 1.41 mg/dL — ABNORMAL HIGH (ref 0.76–1.27)
Glucose: 82 mg/dL (ref 65–99)
Potassium: 5 mmol/L (ref 3.5–5.2)
Sodium: 142 mmol/L (ref 134–144)
eGFR: 51 mL/min/{1.73_m2} — ABNORMAL LOW (ref >59)

## 2021-03-07 LAB — CBC
Hematocrit: 36 % — ABNORMAL LOW (ref 37.5–51.0)
Hemoglobin: 12.2 g/dL — ABNORMAL LOW (ref 13.0–17.7)
MCH: 32.6 pg (ref 26.6–33.0)
MCHC: 33.9 g/dL (ref 31.5–35.7)
MCV: 96 fL (ref 79–97)
Platelets: 186 10*3/uL (ref 150–450)
RBC: 3.74 x10E6/uL — ABNORMAL LOW (ref 4.14–5.80)
RDW: 13.6 % (ref 11.6–15.4)
WBC: 4.7 10*3/uL (ref 3.4–10.8)

## 2021-03-07 LAB — PRO B NATRIURETIC PEPTIDE: NT-Pro BNP: 85 pg/mL (ref 0–486)

## 2021-03-07 NOTE — Addendum Note (Signed)
Addended by: Kela Millin on: 03/07/2021 06:02 AM   Modules accepted: Level of Service

## 2021-03-10 ENCOUNTER — Other Ambulatory Visit (HOSPITAL_COMMUNITY)
Admission: RE | Admit: 2021-03-10 | Discharge: 2021-03-10 | Disposition: A | Discharge status: Home / Self Care | Payer: Medicare Other | Source: Ambulatory Visit | Attending: Cardiology | Admitting: Cardiology

## 2021-03-10 DIAGNOSIS — Z20822 Contact with and (suspected) exposure to covid-19: Secondary | ICD-10-CM | POA: Insufficient documentation

## 2021-03-10 DIAGNOSIS — Z01812 Encounter for preprocedural laboratory examination: Secondary | ICD-10-CM | POA: Diagnosis present

## 2021-03-10 LAB — SARS CORONAVIRUS 2 (TAT 6-24 HRS): SARS Coronavirus 2: NEGATIVE

## 2021-03-14 ENCOUNTER — Other Ambulatory Visit: Payer: Self-pay

## 2021-03-14 ENCOUNTER — Ambulatory Visit (HOSPITAL_COMMUNITY)
Admission: RE | Admit: 2021-03-14 | Discharge: 2021-03-14 | Disposition: A | Discharge status: Home / Self Care | Payer: Medicare Other | Source: Ambulatory Visit | Attending: Cardiology | Admitting: Cardiology

## 2021-03-14 ENCOUNTER — Ambulatory Visit (HOSPITAL_COMMUNITY)
Admission: RE | Disposition: A | Discharge status: Home / Self Care | Payer: Self-pay | Source: Ambulatory Visit | Attending: Cardiology

## 2021-03-14 ENCOUNTER — Other Ambulatory Visit: Payer: Self-pay | Admitting: Cardiology

## 2021-03-14 DIAGNOSIS — Z955 Presence of coronary angioplasty implant and graft: Secondary | ICD-10-CM

## 2021-03-14 DIAGNOSIS — Z886 Allergy status to analgesic agent status: Secondary | ICD-10-CM | POA: Diagnosis not present

## 2021-03-14 DIAGNOSIS — R9439 Abnormal result of other cardiovascular function study: Secondary | ICD-10-CM | POA: Diagnosis present

## 2021-03-14 DIAGNOSIS — I25118 Atherosclerotic heart disease of native coronary artery with other forms of angina pectoris: Secondary | ICD-10-CM | POA: Insufficient documentation

## 2021-03-14 DIAGNOSIS — Z79899 Other long term (current) drug therapy: Secondary | ICD-10-CM | POA: Diagnosis not present

## 2021-03-14 DIAGNOSIS — I428 Other cardiomyopathies: Secondary | ICD-10-CM | POA: Insufficient documentation

## 2021-03-14 DIAGNOSIS — E785 Hyperlipidemia, unspecified: Secondary | ICD-10-CM | POA: Diagnosis not present

## 2021-03-14 DIAGNOSIS — R0609 Other forms of dyspnea: Secondary | ICD-10-CM | POA: Diagnosis present

## 2021-03-14 DIAGNOSIS — G4733 Obstructive sleep apnea (adult) (pediatric): Secondary | ICD-10-CM | POA: Insufficient documentation

## 2021-03-14 DIAGNOSIS — Z7982 Long term (current) use of aspirin: Secondary | ICD-10-CM | POA: Insufficient documentation

## 2021-03-14 DIAGNOSIS — Z888 Allergy status to other drugs, medicaments and biological substances status: Secondary | ICD-10-CM | POA: Diagnosis not present

## 2021-03-14 DIAGNOSIS — I129 Hypertensive chronic kidney disease with stage 1 through stage 4 chronic kidney disease, or unspecified chronic kidney disease: Secondary | ICD-10-CM | POA: Diagnosis not present

## 2021-03-14 DIAGNOSIS — R0600 Dyspnea, unspecified: Secondary | ICD-10-CM | POA: Diagnosis present

## 2021-03-14 DIAGNOSIS — N1831 Chronic kidney disease, stage 3a: Secondary | ICD-10-CM | POA: Diagnosis not present

## 2021-03-14 HISTORY — PX: CORONARY ULTRASOUND/IVUS: CATH118244

## 2021-03-14 HISTORY — PX: CORONARY BALLOON ANGIOPLASTY: CATH118233

## 2021-03-14 HISTORY — PX: RIGHT/LEFT HEART CATH AND CORONARY ANGIOGRAPHY: CATH118266

## 2021-03-14 HISTORY — PX: CORONARY ATHERECTOMY: CATH118238

## 2021-03-14 HISTORY — PX: CORONARY STENT INTERVENTION: CATH118234

## 2021-03-14 LAB — POCT I-STAT EG7
Acid-Base Excess: 0 mmol/L (ref 0.0–2.0)
Acid-base deficit: 1 mmol/L (ref 0.0–2.0)
Bicarbonate: 25.9 mmol/L (ref 20.0–28.0)
Bicarbonate: 26.5 mmol/L (ref 20.0–28.0)
Calcium, Ion: 1.3 mmol/L (ref 1.15–1.40)
Calcium, Ion: 1.29 mmol/L (ref 1.15–1.40)
HCT: 33 % — ABNORMAL LOW (ref 39.0–52.0)
HCT: 33 % — ABNORMAL LOW (ref 39.0–52.0)
Hemoglobin: 11.2 g/dL — ABNORMAL LOW (ref 13.0–17.0)
Hemoglobin: 11.2 g/dL — ABNORMAL LOW (ref 13.0–17.0)
O2 Saturation: 74 %
O2 Saturation: 73 %
Potassium: 4 mmol/L (ref 3.5–5.1)
Potassium: 4.1 mmol/L (ref 3.5–5.1)
Sodium: 143 mmol/L (ref 135–145)
Sodium: 142 mmol/L (ref 135–145)
TCO2: 27 mmol/L (ref 22–32)
TCO2: 28 mmol/L (ref 22–32)
pCO2, Ven: 50.3 mmHg (ref 44.0–60.0)
pCO2, Ven: 51 mmHg (ref 44.0–60.0)
pH, Ven: 7.319 (ref 7.250–7.430)
pH, Ven: 7.324 (ref 7.250–7.430)
pO2, Ven: 43 mmHg (ref 32.0–45.0)
pO2, Ven: 42 mmHg (ref 32.0–45.0)

## 2021-03-14 LAB — POCT I-STAT 7, (LYTES, BLD GAS, ICA,H+H)
Acid-base deficit: 1 mmol/L (ref 0.0–2.0)
Bicarbonate: 24.9 mmol/L (ref 20.0–28.0)
Calcium, Ion: 1.29 mmol/L (ref 1.15–1.40)
HCT: 32 % — ABNORMAL LOW (ref 39.0–52.0)
Hemoglobin: 10.9 g/dL — ABNORMAL LOW (ref 13.0–17.0)
O2 Saturation: 99 %
Potassium: 4.1 mmol/L (ref 3.5–5.1)
Sodium: 142 mmol/L (ref 135–145)
TCO2: 26 mmol/L (ref 22–32)
pCO2 arterial: 44.1 mmHg (ref 32.0–48.0)
pH, Arterial: 7.361 (ref 7.350–7.450)
pO2, Arterial: 148 mmHg — ABNORMAL HIGH (ref 83.0–108.0)

## 2021-03-14 LAB — POCT ACTIVATED CLOTTING TIME
Activated Clotting Time: 273 seconds
Activated Clotting Time: 303 seconds
Activated Clotting Time: 309 seconds

## 2021-03-14 SURGERY — RIGHT/LEFT HEART CATH AND CORONARY ANGIOGRAPHY
Anesthesia: LOCAL

## 2021-03-14 MED ORDER — MIDAZOLAM HCL 2 MG/2ML IJ SOLN
INTRAMUSCULAR | Status: AC
  Filled 2021-03-14: qty 2

## 2021-03-14 MED ORDER — MIDAZOLAM HCL 2 MG/2ML IJ SOLN
INTRAMUSCULAR | Status: DC | PRN
  Administered 2021-03-14: 1 mg via INTRAVENOUS
  Administered 2021-03-14: 2 mg via INTRAVENOUS

## 2021-03-14 MED ORDER — SODIUM CHLORIDE 0.9 % IV SOLN: 250.0000 mL | INTRAVENOUS | Status: DC | PRN

## 2021-03-14 MED ORDER — CLOPIDOGREL BISULFATE 300 MG PO TABS
ORAL_TABLET | ORAL | Status: DC | PRN
  Administered 2021-03-14: 600 mg via ORAL

## 2021-03-14 MED ORDER — CLOPIDOGREL BISULFATE 300 MG PO TABS
ORAL_TABLET | ORAL | Status: AC
  Filled 2021-03-14: qty 1

## 2021-03-14 MED ORDER — ASPIRIN 81 MG PO CHEW
81.0000 mg | CHEWABLE_TABLET | ORAL | Status: AC
  Administered 2021-03-14: 81 mg via ORAL
  Filled 2021-03-14: qty 1

## 2021-03-14 MED ORDER — IOHEXOL 350 MG/ML SOLN
INTRAVENOUS | Status: DC | PRN
  Administered 2021-03-14: 150 mL

## 2021-03-14 MED ORDER — ACETAMINOPHEN 325 MG PO TABS: 650.0000 mg | ORAL_TABLET | ORAL | Status: DC | PRN

## 2021-03-14 MED ORDER — FENTANYL CITRATE (PF) 100 MCG/2ML IJ SOLN
INTRAMUSCULAR | Status: AC
  Filled 2021-03-14: qty 2

## 2021-03-14 MED ORDER — HEPARIN (PORCINE) IN NACL 1000-0.9 UT/500ML-% IV SOLN
INTRAVENOUS | Status: AC
  Filled 2021-03-14: qty 1500

## 2021-03-14 MED ORDER — SODIUM CHLORIDE 0.9 % WEIGHT BASED INFUSION: 1.0000 mL/kg/h | INTRAVENOUS | Status: DC

## 2021-03-14 MED ORDER — NITROGLYCERIN 1 MG/10 ML FOR IR/CATH LAB
INTRA_ARTERIAL | Status: AC
  Filled 2021-03-14: qty 10

## 2021-03-14 MED ORDER — LIDOCAINE HCL (PF) 1 % IJ SOLN
INTRAMUSCULAR | Status: DC | PRN
  Administered 2021-03-14 (×2): 2 mL

## 2021-03-14 MED ORDER — VERAPAMIL HCL 2.5 MG/ML IV SOLN
INTRAVENOUS | Status: DC | PRN
  Administered 2021-03-14: 10 mL via INTRA_ARTERIAL

## 2021-03-14 MED ORDER — HEPARIN SODIUM (PORCINE) 1000 UNIT/ML IJ SOLN
INTRAMUSCULAR | Status: DC | PRN
  Administered 2021-03-14: 5000 [IU] via INTRAVENOUS
  Administered 2021-03-14 (×2): 2000 [IU] via INTRAVENOUS
  Administered 2021-03-14: 5000 [IU] via INTRAVENOUS

## 2021-03-14 MED ORDER — SODIUM CHLORIDE 0.9% FLUSH: 3.0000 mL | INTRAVENOUS | Status: DC | PRN

## 2021-03-14 MED ORDER — HEPARIN (PORCINE) IN NACL 1000-0.9 UT/500ML-% IV SOLN
INTRAVENOUS | Status: DC | PRN
  Administered 2021-03-14 (×2): 500 mL

## 2021-03-14 MED ORDER — HEPARIN SODIUM (PORCINE) 1000 UNIT/ML IJ SOLN
INTRAMUSCULAR | Status: AC
  Filled 2021-03-14: qty 1

## 2021-03-14 MED ORDER — LIDOCAINE HCL (PF) 1 % IJ SOLN
INTRAMUSCULAR | Status: AC
  Filled 2021-03-14: qty 30

## 2021-03-14 MED ORDER — SODIUM CHLORIDE 0.9% FLUSH: 3.0000 mL | Freq: Two times a day (BID) | INTRAVENOUS | Status: DC

## 2021-03-14 MED ORDER — CLOPIDOGREL BISULFATE 75 MG PO TABS: 75.0000 mg | ORAL_TABLET | Freq: Every day | ORAL | 0 refills | Status: DC

## 2021-03-14 MED ORDER — VERAPAMIL HCL 2.5 MG/ML IV SOLN
INTRAVENOUS | Status: AC
  Filled 2021-03-14: qty 2

## 2021-03-14 MED ORDER — SODIUM CHLORIDE 0.9 % WEIGHT BASED INFUSION
3.0000 mL/kg/h | INTRAVENOUS | Status: AC
  Administered 2021-03-14: 3 mL/kg/h via INTRAVENOUS

## 2021-03-14 MED ORDER — FENTANYL CITRATE (PF) 100 MCG/2ML IJ SOLN
INTRAMUSCULAR | Status: DC | PRN
  Administered 2021-03-14 (×2): 25 ug via INTRAVENOUS

## 2021-03-14 MED ORDER — HYDRALAZINE HCL 20 MG/ML IJ SOLN: 5.0000 mg | INTRAMUSCULAR | Status: DC | PRN

## 2021-03-14 MED ORDER — ONDANSETRON HCL 4 MG/2ML IJ SOLN: 4.0000 mg | Freq: Four times a day (QID) | INTRAMUSCULAR | Status: DC | PRN

## 2021-03-14 MED FILL — CLOPIDOGREL 75 MG TABLET: 75 | 30 days supply | Qty: 30 | Fill #0

## 2021-03-14 SURGICAL SUPPLY — 27 items
BALLN  ~~LOC~~ SAPPHIRE 5.0X8 (BALLOONS) ×2
BALLN ~~LOC~~ EMERGE MR 3.5X6 (BALLOONS) ×2
BALLN ~~LOC~~ SAPPHIRE 5.0X8 (BALLOONS) ×1
BALLOON ~~LOC~~ EMERGE MR 3.5X6 (BALLOONS) ×1 IMPLANT
BALLOON ~~LOC~~ SAPPHIRE 5.0X8 (BALLOONS) ×1 IMPLANT
CATH BALLN WEDGE 5F 110CM (CATHETERS) ×2 IMPLANT
CATH LAUNCHER 6FR EBU3.5 (CATHETERS) ×2 IMPLANT
CATH OPTICROSS HD (CATHETERS) ×2 IMPLANT
CATH OPTITORQUE TIG 4.0 5F (CATHETERS) ×2 IMPLANT
CATH TELEPORT (CATHETERS) ×2 IMPLANT
CATH TRAPPER 6-8F (CATHETERS) ×2 IMPLANT
CROWN DIAMONDBACK CLASSIC 1.25 (BURR) ×2 IMPLANT
DEVICE RAD COMP TR BAND LRG (VASCULAR PRODUCTS) ×2 IMPLANT
GLIDESHEATH SLEND A-KIT 6F 22G (SHEATH) ×2 IMPLANT
GUIDEWIRE .025 260CM (WIRE) ×2 IMPLANT
GUIDEWIRE INQWIRE 1.5J.035X260 (WIRE) ×1 IMPLANT
INQWIRE 1.5J .035X260CM (WIRE) ×2
KIT ENCORE 26 ADVANTAGE (KITS) ×2 IMPLANT
KIT HEART LEFT (KITS) ×2 IMPLANT
PACK CARDIAC CATHETERIZATION (CUSTOM PROCEDURE TRAY) ×2 IMPLANT
SHEATH GLIDE SLENDER 4/5FR (SHEATH) ×2 IMPLANT
SLED PULL BACK IVUS (MISCELLANEOUS) ×2 IMPLANT
STENT RESOLUTE ONYX 4.0X8 (Permanent Stent) ×2 IMPLANT
TRANSDUCER W/STOPCOCK (MISCELLANEOUS) ×2 IMPLANT
TUBING CIL FLEX 10 FLL-RA (TUBING) ×2 IMPLANT
WIRE COUGAR XT STRL 190CM (WIRE) ×4 IMPLANT
WIRE VIPERWIRE COR FLEX .012 (WIRE) ×4 IMPLANT

## 2021-03-14 NOTE — Discharge Instructions (Signed)
Radial Site Care  This sheet gives you information about how to care for yourself after your procedure. Your health care provider may also give you more specific instructions. If you have problems or questions, contact your health care provider. What can I expect after the procedure? After the procedure, it is common to have:  Bruising and tenderness at the catheter insertion area. Follow these instructions at home: Medicines  Take over-the-counter and prescription medicines only as told by your health care provider. Insertion site care 1. Follow instructions from your health care provider about how to take care of your insertion site. Make sure you: ? Wash your hands with soap and water before you remove your bandage (dressing). If soap and water are not available, use hand sanitizer. ? May remove dressing in 24 hours. 2. Check your insertion site every day for signs of infection. Check for: ? Redness, swelling, or pain. ? Fluid or blood. ? Pus or a bad smell. ? Warmth. 3. Do no take baths, swim, or use a hot tub for 5 days. 4. You may shower 24-48 hours after the procedure. ? Remove the dressing and gently wash the site with plain soap and water. ? Pat the area dry with a clean towel. ? Do not rub the site. That could cause bleeding. 5. Do not apply powder or lotion to the site. Activity  1. For 24 hours after the procedure, or as directed by your health care provider: ? Do not flex or bend the affected arm. ? Do not push or pull heavy objects with the affected arm. ? Do not drive yourself home from the hospital or clinic. You may drive 24 hours after the procedure. ? Do not operate machinery or power tools. ? KEEP ARM ELEVATED THE REMAINDER OF THE DAY. 2. Do not push, pull or lift anything that is heavier than 10 lb for 5 days. 3. Ask your health care provider when it is okay to: ? Return to work or school. ? Resume usual physical activities or sports. ? Resume sexual  activity. General instructions  If the catheter site starts to bleed, raise your arm and put firm pressure on the site. If the bleeding does not stop, get help right away. This is a medical emergency.  DRINK PLENTY OF FLUIDS FOR THE NEXT 2-3 DAYS.  No alcohol consumption for 24 hours after receiving sedation.  If you went home on the same day as your procedure, a responsible adult should be with you for the first 24 hours after you arrive home.  Keep all follow-up visits as told by your health care provider. This is important. Contact a health care provider if:  You have a fever.  You have redness, swelling, or yellow drainage around your insertion site. Get help right away if:  You have unusual pain at the radial site.  The catheter insertion area swells very fast.  The insertion area is bleeding, and the bleeding does not stop when you hold steady pressure on the area.  Your arm or hand becomes pale, cool, tingly, or numb. These symptoms may represent a serious problem that is an emergency. Do not wait to see if the symptoms will go away. Get medical help right away. Call your local emergency services (911 in the [place] Do not drive yourself to the hospital. Summary  After the procedure, it is common to have bruising and tenderness at the site.  Follow instructions from your health care provider about how to take care   of your radial site wound. Check the wound every day for signs of infection.  This information is not intended to replace advice given to you by your health care provider. Make sure you discuss any questions you have with your health care provider. Document Revised: 01/15/2018 Document Reviewed: 01/15/2018 Elsevier Patient Education  2020 [employer] 

## 2021-03-14 NOTE — Progress Notes (Signed)
CARDIAC REHAB PHASE I    Discussed stent, restrictions, importance of Plavix, diet, exercise, NTG and CRPII. Pt voiced understanding. Requests referral to [place], ACSM 03/14/2021 2:01 PM

## 2021-03-14 NOTE — Progress Notes (Signed)
Dr Einar Gip in to see patient. OK for discharge at 1600.

## 2021-03-14 NOTE — Interval H&P Note (Signed)
History and Physical Interval Note:  03/14/2021 6:50 AM  Martin Stephenson  has presented today for surgery, with the diagnosis of shortness of breath - abnormal stress test - pre op.  The various methods of treatment have been discussed with the patient and family. After consideration of risks, benefits and other options for treatment, the patient has consented to  Procedure(s): RIGHT/LEFT HEART CATH AND CORONARY ANGIOGRAPHY (N/A) and possible coronary angioplasty as a surgical intervention.  The patient's history has been reviewed, patient examined, no change in status, stable for surgery.  I have reviewed the patient's chart and labs.  Questions were answered to the patient's satisfaction.    Cath Lab Visit (complete for each Cath Lab visit)  Clinical Evaluation Leading to the Procedure:   ACS: No.  Non-ACS:    Anginal Classification: CCS III  Anti-ischemic medical therapy: Maximal Therapy (2 or more classes of medications)  Non-Invasive Test Results: Intermediate-risk stress test findings: cardiac mortality 1-3%/year  Prior CABG: No previous CABG  Martin Stephenson

## 2021-03-14 NOTE — Progress Notes (Signed)
TR BAND REMOVAL  LOCATION:    right radial  DEFLATED PER PROTOCOL:    Yes.    TIME BAND OFF / DRESSING APPLIED:    1315   SITE UPON ARRIVAL:    Level 0  SITE AFTER BAND REMOVAL:    Level 0  CIRCULATION SENSATION AND MOVEMENT:    Within Normal Limits   Yes.

## 2021-03-15 ENCOUNTER — Telehealth (HOSPITAL_COMMUNITY): Payer: Self-pay

## 2021-03-15 ENCOUNTER — Encounter (HOSPITAL_COMMUNITY): Payer: Self-pay | Admitting: Cardiology

## 2021-03-15 MED FILL — Heparin Sod (Porcine)-NaCl IV Soln 1000 Unit/500ML-0.9%: INTRAVENOUS | Qty: 500 | Status: AC

## 2021-03-15 MED FILL — Nitroglycerin IV Soln 100 MCG/ML in D5W: INTRA_ARTERIAL | Qty: 10 | Status: AC

## 2021-03-15 NOTE — Telephone Encounter (Signed)
Pt insurance is active and benefits verified through [employer] $0.00, DED $0.00/$0.00 met, out of pocket $2,400.00/$336.85 met, co-insurance 10%. No pre-authorization required. [employer]port, 03/15/21 @ 11:20AM, FRT#[id number]  Will contact patient to see if he is interested in the Cardiac Rehab Program. If interested, patient will need to complete follow up appt. Once completed, patient will be contacted for scheduling upon review by the RN Navigator.

## 2021-03-16 ENCOUNTER — Ambulatory Visit: Payer: Medicare Other

## 2021-03-27 NOTE — Progress Notes (Signed)
Primary Physician/Referring:  Tisovec, Fransico Him, MD  Patient ID: Martin Stephenson, male    DOB: [birth year], 80 y.o.   MRN: [id number]  Chief Complaint  Patient presents with  . Post Angioplasty  . Follow-up   HPI:    Martin Stephenson  is a 80 y.o. 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 Originally referred for evaluation of dyspnea on exertion. Leg edema as remained stable.   Patient presents for follow-up after after right heart catheterization on 03/14/2021 with stenting to ostial circumflex with spillage of stent to the left main.  Patient reports he is feeling well overall, however he does continue to have fatigue and dyspnea on exertion which have both improved since cardiac catheterization.  Denies chest pain, dizziness, syncope, near syncope.  He also continues to have bilateral lower leg edema which is chronic and stable.  He unfortunately has not been monitoring his blood pressure on a daily basis at home.  He is tolerating present cardiovascular medications without issue.  Past Medical History:  Diagnosis Date  . Allergy   . Anemia   . Arthritis    foot  . Congestive heart failure ([place])   . Constipation    uses mag citrate OTC if needed  . GERD (gastroesophageal reflux disease)   . Hiatal hernia   . Hyperlipidemia   . Hypertension   . Kidney stone   . Sleep apnea    wears cpap   Past Surgical History:  Procedure Laterality Date  . COLONOSCOPY    . CORONARY ATHERECTOMY N/A 03/14/2021   Procedure: CORONARY ATHERECTOMY;  Surgeon: [name], MD;  Location: [facility];  Service: Cardiovascular;  Laterality: N/A;  . CORONARY BALLOON ANGIOPLASTY N/A 03/14/2021   Procedure: CORONARY BALLOON ANGIOPLASTY;  Surgeon: [name], MD;  Location: [facility];   Service: Cardiovascular;  Laterality: N/A;  . CORONARY STENT INTERVENTION N/A 03/14/2021   Procedure: CORONARY STENT INTERVENTION;  Surgeon: [name], MD;  Location: [facility];  Service: Cardiovascular;  Laterality: N/A;  . HAMMER TOE SURGERY  1998   1st toe right foot  . hydrocelectomy  1988  . INTRAVASCULAR ULTRASOUND/IVUS N/A 03/14/2021   Procedure: Intravascular Ultrasound/IVUS;  Surgeon: [name], MD;  Location: [facility];  Service: Cardiovascular;  Laterality: N/A;  . POLYPECTOMY    . RIGHT/LEFT HEART CATH AND CORONARY ANGIOGRAPHY N/A 03/14/2021   Procedure: RIGHT/LEFT HEART CATH AND CORONARY ANGIOGRAPHY;  Surgeon: [name], MD;  Location: [facility];  Service: Cardiovascular;  Laterality: N/A;  . TONSILLECTOMY  1988   Family History  Problem Relation Age of Onset  . Colon cancer Mother   . Stomach cancer Neg Hx   . Colon polyps Neg Hx   . Esophageal cancer Neg Hx   . Rectal cancer Neg Hx     Social History   Tobacco Use  . Smoking status: Never Smoker  . Smokeless tobacco: Never Used  Substance Use Topics  . Alcohol use: No   Marital Status: Married  ROS  Review of Systems  Constitutional: Positive for malaise/fatigue. Negative for weight gain.  Cardiovascular: Positive for dyspnea on exertion and leg swelling (chronic and uses support stockings). Negative for chest pain, claudication, near-syncope, orthopnea, palpitations, paroxysmal nocturnal dyspnea and syncope.  Respiratory: Negative for shortness  of breath.   Hematologic/Lymphatic: Does not bruise/bleed easily.  Musculoskeletal: Positive for joint pain (left knee > right).  Gastrointestinal: Negative for melena.  Neurological: Negative for dizziness and weakness.   Objective  Blood pressure (!) 141/75, pulse 73, temperature (!) 97.5 F (36.4 C), height 6' 4" (1.93 m), weight 248 lb (112.5 kg), SpO2 97 %.  Vitals with BMI 03/28/2021 03/28/2021 03/14/2021  Height - 6' 4" -  Weight - 248  lbs -  BMI - 27.0 -  Systolic 350 093 818  Diastolic 75 81 70  Pulse 73 71 57     Physical Exam Vitals reviewed.  HENT:     Head: Normocephalic and atraumatic.  Cardiovascular:     Rate and Rhythm: Normal rate and regular rhythm.     Pulses: Intact distal pulses.     Heart sounds: Normal heart sounds, S1 normal and S2 normal. No murmur heard. No gallop.      Comments: 2+ bilateral pitting leg edema, no JVD. Right radial access healing well, small hematoma without bruit.  No significant ecchymosis, swelling, tenderness, drainage. Pulmonary:     Effort: Pulmonary effort is normal. No respiratory distress.     Breath sounds: Normal breath sounds. No wheezing, rhonchi or rales.  Abdominal:     General: Bowel sounds are normal.     Palpations: Abdomen is soft.  Musculoskeletal:     Right lower leg: Edema present.     Left lower leg: Edema present.  Skin:    General: Skin is warm and dry.  Neurological:     Mental Status: He is alert.    Laboratory examination:   Recent Labs    03/06/21 1557 03/14/21 0810 03/14/21 0817  NA 142 142  143 142  K 5.0 4.1  4.0 4.1  CL 106  --   --   CO2 23  --   --   GLUCOSE 82  --   --   BUN 18  --   --   CREATININE 1.41*  --   --   CALCIUM 9.5  --   --    CrCl cannot be calculated (Patient's most recent lab result is older than the maximum 21 days allowed.).  CMP Latest Ref Rng & Units 03/14/2021 03/14/2021 03/14/2021  Glucose 65 - 99 mg/dL - - -  BUN 8 - 27 mg/dL - - -  Creatinine 0.76 - 1.27 mg/dL - - -  Sodium 135 - 145 mmol/L 142 142 143  Potassium 3.5 - 5.1 mmol/L 4.1 4.1 4.0  Chloride 96 - 106 mmol/L - - -  CO2 20 - 29 mmol/L - - -  Calcium 8.6 - 10.2 mg/dL - - -  Total Protein 6.0 - 8.3 g/dL - - -  Total Bilirubin 0.2 - 1.2 mg/dL - - -  Alkaline Phos 39 - 117 U/L - - -  AST 0 - 37 U/L - - -  ALT 0 - 53 U/L - - -   CBC Latest Ref Rng & Units 03/14/2021 03/14/2021 03/14/2021  WBC 3.4 - 10.8 x10E3/uL - - -  Hemoglobin 13.0 -  17.0 g/dL 10.9(L) 11.2(L) 11.2(L)  Hematocrit 39.0 - 52.0 % 32.0(L) 33.0(L) 33.0(L)  Platelets 150 - 450 x10E3/uL - - -   ProBNP    Component Value Date/Time   PROBNP 85 03/06/2021 1557   PROBNP 77.0 03/20/2017 0957    TSH No results for input(s): TSH in the last 8760 hours.  External labs:   Labs  12/26/2020:  BUN 24, serum glucose 106 mg, sodium 143, potassium 4.4, creatinine 1.47 EGFR 56 mL, CMP otherwise normal.  Uric acid normal at 4.1.  Hemoglobin 11.2/hematocrit 34.0, normal indicis.  Platelets 190.  Cholesterol, total 119.000 m 06/15/2020 HDL 36 MG/DL 06/15/2020 LDL 65.000 mg 06/15/2020 Triglycerides 88.000 06/15/2020   Hemoglobin 12.100 g/d 06/15/2020  Creatinine, Serum 1.300 mg/ 06/15/2020 Potassium 4.100 MM 01/24/2017 ALT (SGPT) 14.000 uni 06/15/2020  Medications and allergies   Allergies  Allergen Reactions  . Aleve [Naproxen Sodium]     Patient has been instructed to avoid Ibuprofen b/c of his Creatinine level.  . Ibuprofen Other (See Comments)    Patient has been instructed to avoid Ibuprofen b/c of his Creatinine level.  Marland Kitchen Furacin [Nitrofurazone] Rash     Outpatient Medications Prior to Visit  Medication Sig Dispense Refill  . allopurinol (ZYLOPRIM) 300 MG tablet Take 300 mg by mouth daily.     Marland Kitchen amLODipine (NORVASC) 5 MG tablet Take 1 tablet (5 mg total) by mouth daily. 30 tablet 2  . aspirin 81 MG tablet Take 81 mg by mouth daily.    . Calcium Carb-Cholecalciferol (CALCIUM 600/VITAMIN D3 PO) Take 1 tablet by mouth daily.     . clopidogrel (PLAVIX) 75 MG tablet Take 1 tablet (75 mg total) by mouth daily. 30 tablet 0  . ferrous sulfate 325 (65 FE) MG tablet Take 325 mg by mouth every Monday, Wednesday, and Friday.    . labetalol (NORMODYNE) 100 MG tablet Take 1 tablet (100 mg total) by mouth 2 (two) times daily. 60 tablet 2  . losartan (COZAAR) 25 MG tablet TAKE 1 TABLET BY MOUTH EVERY DAY IN THE EVENING (Patient taking differently: Take 25 mg by mouth every  evening.) 90 tablet 1  . rosuvastatin (CRESTOR) 10 MG tablet Take 10 mg by mouth every Monday, Wednesday, and Friday.    . Torsemide 40 MG TABS Take 40 mg by mouth daily as needed (fluid).  6  . clopidogrel (PLAVIX) 75 MG tablet TAKE 1 TABLET (75 MG TOTAL) BY MOUTH DAILY. 30 tablet 0   No facility-administered medications prior to visit.   Radiology:   Lower extremity venous insufficiency study 02/27/2017: 1. Normal bilateral lower extremity deep venous systems. No evidence of acute or chronic DVT. 2. Unremarkable bilateral lower extremity saphenous venous systems. No evidence of valvular incompetence, reflux, or significant varicose vein disease.  CT angiogram chest 03/22/2017: Cardiovascular: Heart size upper normal. No pericardial effusion. Coronary artery calcification is noted.  Atherosclerotic calcification is noted in the wall of the thoracic aorta.  No filling defect in the opacified pulmonary arteries to suggest the presence of an acute pulmonary embolus.  MRI neck 02/22/2021:  Multilevel degenerative change in the cervical spine. 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  Cardiac Studies:   Heart Cath  [2007]: at [facility] hosp: Normal coronary arteries.  [facility]  [facility]]: Severe sleep apnea  Exercise Myoview stress test 02/13/2021: Exercise nuclear stress test was performed using Bruce protocol. Patient reached 7 METS, and 101% of age predicted maximum heart rate. Exercise capacity was low. Non-limiting chest pain reported. Heart rate and hemodynamic response were normal. Peak EKG demonstrated sinus tachycardia, 2-3 mm horizontal/down-sloping ST depressions in leads II, III, aVF, V4-V6, partially normalize 2 min into recovery. SPECT images showed medium sized, mild intensity, mildly reversible perfusion defect in basal-mid  inferoseptal, inferior, inferolateral myocardium. Stress LVEF is calculated 44%, although visually appears normal.  Intermediate risk study.   Compared to treadmill stress test in 2018, no significant change in exercise capacity although there were no ST-T wave changes of ischemia.  Echocardiogram 02/17/2021: Left ventricle cavity is normal in size. Moderate concentric hypertrophy of the left ventricle. Mild global hypokinesis. LVEF 45-50%. Normal global wall motion. Diastolic function not assessed due to severity of mitral regurgitation.  Left atrial cavity is severely dilated. Trileaflet aortic valve.  Moderate (Grade II) aortic regurgitation. Moderate (Grade III) mitral regurgitation. Mild tricuspid regurgitation. No evidence of pulmonary hypertension. Compared to previous study in 2017, LVEF is mildly reduced from 50-55%.   Right and left heart catheterization 03/14/2021: Normal right heart catheterization with preserved cardiac output and cardiac index. LV: Mild decrease in LVEF, global hypokinesis, EF from 45% to at most 50%.  No significant mitral regurgitation.  Normal EDP.  No pressure gradient across the aortic valve. RCA: Large vessel, with mild diffuse disease. Left main: Large vessel, distal left main has 20 to 30% stenosis.  Mild calcification is evident. LAD: Large-caliber vessel giving origin to moderate to large size D1 and D2.  After the origin of a large D1, there is a 70%, appears to be high-grade intracranial views, mildly calcified lesion noted.  Otherwise there is mild disease.  IVUS revealing 67% stenosis with a minimum luminal area of 5.81 mm.  Lesion left alone. Circumflex: Very large caliber vessel giving origin to 2 large marginals.  The OM1 has proximal 30 to 40% stenosis.  Ostium of the circumflex has a calcific 70 or high-grade stenosis. IVUS confirmed high-grade stenosis, 4.0 x 4.0 mm vessel, tightest segment 1.7 by 1.7.  Minimum lumen 2.35 mm.  Post PCI lumen  area 14.58 mm.  A 4.0 x 8 mm resolute Onyx DES was deployed extending into the distal left main and left main stent was optimized with a 5.0 x 8 mm balloon.  IVUS confirmed excellent apposition of the stent. Balloon angioplasty to ostial LAD to open up the stent struts with a 3.5 x 8 mm balloon.  IVUS confirmed no significant stent distortion.  Recommendation: Patient hopefully will get benefit from the circumflex PCI.  Mid LAD has a moderate to high-grade stenosis, intermediate range, in view of absence of ischemia in the anterior territory by nuclear stress test we will leave it alone.  Continue medical therapy.  150 mL contrast utilized.  Patient needs back surgery, this will be postponed for at least 3 months if not 6 months.   EKG:   EKG 03/28/2021: Sinus rhythm, rate of 71 bpm. normal axis.  No evidence of underlying injury or ischemia pattern.  Compared to EKG 01/30/2021, no bradycardia.  EKG 01/30/2021: Sinus bradycardia at rate of 48 bpm, otherwise normal EKG.  Compared to 2018, heart rate was 52 bpm.  Assessment     ICD-10-CM   1. 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 Status post coronary artery stent placement  Z95.5 EKG 12-Lead  3. Essential hypertension  H47 Basic metabolic panel  4. Mixed hyperlipidemia  E78.2   5. Dyspnea on exertion  R06.00     Medications Discontinued During This Encounter  Medication Reason  . clopidogrel (PLAVIX) 75 MG tablet Error    No orders of the defined types were placed in this encounter.  Orders Placed This Encounter  Procedures  . Basic metabolic panel  .  EKG 12-Lead   Recommendations:   BLANDON OFFERDAHL is a 80 y.o. 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 Originally referred for evaluation of dyspnea on exertion. Leg edema as remained stable.   Patient presents for follow-up after after left and right heart catheterization on 03/14/2021 with stenting to ostial circumflex with spillage of stent to the left main.  Notably right heart catheterization was without significant abnormality.  Patient is feeling well overall, although he does continue to have dyspnea on exertion and fatigue.  Recommend patient undergo cardiac rehab as well as slowly increase his physical activity independently.  He is tolerating guideline directed medical therapy without issue, will continue aspirin, Plavix, losartan, labetalol, and rosuvastatin.  He continues to take torsemide as needed.  Patient's blood pressure remains elevatedo of goal.  However there is no recent BMP following cardiac catheterization.  Will obtain repeat BMP.  We will then determine antihypertensive medication adjustments once able to evaluate renal function electrolytes.  Patient has been diagnosed with severe cervical spine stenosis and needs decompression surgery, however in view of recent cardiac catheterization recommend delaying surgery for at least 3 preferably 6 months.  Patient has had no anginal symptoms and there are no clinical signs of heart failure.  Reviewed and discussed with patient regarding results of cardiac catheterization.  Recommend follow-up in 3 months, however patient prefers to keep appointment with Dr. Einar Gip on 04/21/21 to discuss agent orange exposure and coordinating benefits with the [facility].  Patient prefers to keep appt with dr. Einar Gip on 4/29 to discuss "agent orange exposure" and coordinating things with the [facility].    Alethia Berthold, PA-C 03/28/2021, 3:54 PM Office: [phone]

## 2021-03-28 ENCOUNTER — Ambulatory Visit: Payer: Medicare Other | Admitting: Student

## 2021-03-28 ENCOUNTER — Encounter: Payer: Self-pay | Admitting: Student

## 2021-03-28 ENCOUNTER — Other Ambulatory Visit: Payer: Self-pay

## 2021-03-28 VITALS — BP 141/75 | HR 73 | Temp 97.5°F | Ht 76.0 in | Wt 248.0 lb

## 2021-03-28 DIAGNOSIS — E782 Mixed hyperlipidemia: Secondary | ICD-10-CM

## 2021-03-28 DIAGNOSIS — I2510 Atherosclerotic heart disease of native coronary artery without angina pectoris: Secondary | ICD-10-CM

## 2021-03-28 DIAGNOSIS — I10 Essential (primary) hypertension: Secondary | ICD-10-CM

## 2021-03-28 DIAGNOSIS — R0600 Dyspnea, unspecified: Secondary | ICD-10-CM

## 2021-03-28 DIAGNOSIS — R0609 Other forms of dyspnea: Secondary | ICD-10-CM

## 2021-03-28 DIAGNOSIS — Z955 Presence of coronary angioplasty implant and graft: Secondary | ICD-10-CM

## 2021-03-30 LAB — BASIC METABOLIC PANEL
BUN/Creatinine Ratio: 16 (ref 10–24)
BUN: 22 mg/dL (ref 8–27)
CO2: 23 mmol/L (ref 20–29)
Calcium: 9.3 mg/dL (ref 8.6–10.2)
Chloride: 107 mmol/L — ABNORMAL HIGH (ref 96–106)
Creatinine, Ser: 1.35 mg/dL — ABNORMAL HIGH (ref 0.76–1.27)
Glucose: 83 mg/dL (ref 65–99)
Potassium: 5 mmol/L (ref 3.5–5.2)
Sodium: 144 mmol/L (ref 134–144)
eGFR: 54 mL/min/{1.73_m2} — ABNORMAL LOW (ref >59)

## 2021-04-04 NOTE — Progress Notes (Signed)
Called, patient unavailable.  Spoke with wife, she will have patient call the office tomorrow to review labs and discuss potentially increasing losartan dose.

## 2021-04-05 ENCOUNTER — Other Ambulatory Visit: Payer: Self-pay | Admitting: Student

## 2021-04-05 DIAGNOSIS — I10 Essential (primary) hypertension: Secondary | ICD-10-CM

## 2021-04-05 MED ORDER — CLOPIDOGREL BISULFATE 75 MG PO TABS: 75.0000 mg | ORAL_TABLET | Freq: Every day | ORAL | 3 refills | Status: DC

## 2021-04-05 MED ORDER — LOSARTAN POTASSIUM 50 MG PO TABS: 50.0000 mg | ORAL_TABLET | Freq: Every evening | ORAL | 3 refills | Status: DC

## 2021-04-05 NOTE — Progress Notes (Signed)
Called and left voice mail for patient to call office.

## 2021-04-05 NOTE — Progress Notes (Signed)
In view of stable renal function and continued uncontrolled hypertension we will increase losartan from 25 mg to 50 mg daily and repeat BMP in 1 week.  I have also refilled his Plavix.

## 2021-04-17 ENCOUNTER — Other Ambulatory Visit: Payer: Self-pay | Admitting: Cardiology

## 2021-04-17 DIAGNOSIS — I10 Essential (primary) hypertension: Secondary | ICD-10-CM

## 2021-04-21 ENCOUNTER — Encounter: Payer: Self-pay | Admitting: Podiatry

## 2021-04-21 ENCOUNTER — Encounter: Payer: Self-pay | Admitting: Cardiology

## 2021-04-21 ENCOUNTER — Other Ambulatory Visit: Payer: Self-pay | Admitting: Student

## 2021-04-21 ENCOUNTER — Ambulatory Visit (INDEPENDENT_AMBULATORY_CARE_PROVIDER_SITE_OTHER): Payer: Medicare Other

## 2021-04-21 ENCOUNTER — Ambulatory Visit: Payer: Medicare Other | Admitting: Cardiology

## 2021-04-21 ENCOUNTER — Ambulatory Visit: Payer: Medicare Other | Admitting: Podiatry

## 2021-04-21 ENCOUNTER — Other Ambulatory Visit: Payer: Self-pay

## 2021-04-21 VITALS — BP 130/70 | HR 75 | Temp 98.4°F | Resp 14 | Ht 76.0 in | Wt 249.0 lb

## 2021-04-21 DIAGNOSIS — I25118 Atherosclerotic heart disease of native coronary artery with other forms of angina pectoris: Secondary | ICD-10-CM

## 2021-04-21 DIAGNOSIS — D689 Coagulation defect, unspecified: Secondary | ICD-10-CM | POA: Diagnosis not present

## 2021-04-21 DIAGNOSIS — Z77098 Contact with and (suspected) exposure to other hazardous, chiefly nonmedicinal, chemicals: Secondary | ICD-10-CM

## 2021-04-21 DIAGNOSIS — M21619 Bunion of unspecified foot: Secondary | ICD-10-CM

## 2021-04-21 DIAGNOSIS — L84 Corns and callosities: Secondary | ICD-10-CM | POA: Diagnosis not present

## 2021-04-21 DIAGNOSIS — I10 Essential (primary) hypertension: Secondary | ICD-10-CM

## 2021-04-21 DIAGNOSIS — N1831 Chronic kidney disease, stage 3a: Secondary | ICD-10-CM

## 2021-04-21 DIAGNOSIS — R0609 Other forms of dyspnea: Secondary | ICD-10-CM

## 2021-04-21 DIAGNOSIS — R0600 Dyspnea, unspecified: Secondary | ICD-10-CM

## 2021-04-21 NOTE — Progress Notes (Signed)
Primary Physician/Referring:  Tisovec, Fransico Him, MD  Patient ID: Martin Stephenson, male    DOB: 19-Feb-1941, 80 y.o.   MRN: [id number]  Chief Complaint  Patient presents with  . Cardiomyopathy  . Coronary Artery Disease   HPI:    Martin Stephenson  is a 45 y.o. 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   He underwent successful revascularization of a very large circumflex coronary artery on 03/14/2021 and was seen by [place] about 2 weeks ago but wanted to come back and see me to further discuss disability issues with regard to Agent Orange exposure and its relationship to CAD and cardiomyopathy.  States that since angioplasty has noticed improvement in symptoms of dyspnea.  He has not had any need for sublingual nitroglycerin.  Leg edema has remained stable.  Past Medical History:  Diagnosis Date  . Allergy   . Anemia   . Arthritis    foot  . Congestive heart failure ([place])   . Constipation    uses mag citrate OTC if needed  . GERD (gastroesophageal reflux disease)   . Hiatal hernia   . Hyperlipidemia   . Hypertension   . Kidney stone   . Sleep apnea    wears cpap   Past Surgical History:  Procedure Laterality Date  . COLONOSCOPY    . CORONARY ATHERECTOMY N/A 03/14/2021   Procedure: CORONARY ATHERECTOMY;  Surgeon: [name], MD;  Location: [facility];  Service: Cardiovascular;  Laterality: N/A;  . CORONARY BALLOON ANGIOPLASTY N/A 03/14/2021   Procedure: CORONARY BALLOON ANGIOPLASTY;  Surgeon: [name], MD;  Location: [facility];  Service: Cardiovascular;  Laterality: N/A;  . CORONARY STENT INTERVENTION N/A 03/14/2021   Procedure: CORONARY STENT INTERVENTION;  Surgeon: [name], MD;  Location: [facility];  Service: Cardiovascular;  Laterality: N/A;  .  HAMMER TOE SURGERY  1998   1st toe right foot  . hydrocelectomy  1988  . INTRAVASCULAR ULTRASOUND/IVUS N/A 03/14/2021   Procedure: Intravascular Ultrasound/IVUS;  Surgeon: [name], MD;  Location: [facility];  Service: Cardiovascular;  Laterality: N/A;  . POLYPECTOMY    . RIGHT/LEFT HEART CATH AND CORONARY ANGIOGRAPHY N/A 03/14/2021   Procedure: RIGHT/LEFT HEART CATH AND CORONARY ANGIOGRAPHY;  Surgeon: [name], MD;  Location: [facility];  Service: Cardiovascular;  Laterality: N/A;  . TONSILLECTOMY  1988   Family History  Problem Relation Age of Onset  . Colon cancer Mother   . Stomach cancer Neg Hx   . Colon polyps Neg Hx   . Esophageal cancer Neg Hx   . Rectal cancer Neg Hx     Social History   Tobacco Use  . Smoking status: Never Smoker  . Smokeless tobacco: Never Used  Substance Use Topics  . Alcohol use: No   Marital Status: Married  ROS  Review of Systems  Constitutional: Negative for malaise/fatigue and weight gain.  Cardiovascular: Positive for dyspnea on exertion (improved) and leg swelling (chronic and uses support stockings). Negative for chest pain, claudication, orthopnea, palpitations and syncope.  Hematologic/Lymphatic: Does not bruise/bleed easily.  Musculoskeletal: Positive for joint pain (left knee > right).  Gastrointestinal: Negative for melena.   Objective  Blood pressure 130/70, pulse 75, temperature 98.4 F (36.9 C), temperature source Temporal, resp. rate 14, height 6'  4" (1.93 m), weight 249 lb (112.9 kg), SpO2 98 %.  Vitals with BMI 04/21/2021 03/28/2021 03/28/2021  Height 6' 4"  - 6' 4"   Weight 249 lbs - 248 lbs  BMI 03.47 - 42.5  Systolic 956 387 564  Diastolic 70 75 81  Pulse 75 73 71     Physical Exam Vitals reviewed.  HENT:     Head: Normocephalic and atraumatic.  Cardiovascular:     Rate and Rhythm: Normal rate and regular rhythm.     Pulses: Normal pulses and intact distal pulses.     Heart sounds: Normal heart sounds,  S1 normal and S2 normal. No murmur heard. No gallop.      Comments: 2+ bilateral pitting leg edema, no JVD. Pulmonary:     Effort: Pulmonary effort is normal. No respiratory distress.     Breath sounds: Normal breath sounds. No wheezing, rhonchi or rales.  Abdominal:     General: Bowel sounds are normal.     Palpations: Abdomen is soft.  Musculoskeletal:     Right lower leg: Edema present.     Left lower leg: Edema present.  Skin:    General: Skin is warm and dry.  Neurological:     Mental Status: He is alert.    Laboratory examination:   Recent Labs    03/06/21 1557 03/14/21 0810 03/14/21 0817 03/29/21 1044  NA 142 142  143 142 144  K 5.0 4.1  4.0 4.1 5.0  CL 106  --   --  107*  CO2 23  --   --  23  GLUCOSE 82  --   --  83  BUN 18  --   --  22  CREATININE 1.41*  --   --  1.35*  CALCIUM 9.5  --   --  9.3   CrCl cannot be calculated (Patient's most recent lab result is older than the maximum 21 days allowed.).  CMP Latest Ref Rng & Units 03/29/2021 03/14/2021 03/14/2021  Glucose 65 - 99 mg/dL 83 - -  BUN 8 - 27 mg/dL 22 - -  Creatinine 0.76 - 1.27 mg/dL 1.35(H) - -  Sodium 134 - 144 mmol/L 144 142 142  Potassium 3.5 - 5.2 mmol/L 5.0 4.1 4.1  Chloride 96 - 106 mmol/L 107(H) - -  CO2 20 - 29 mmol/L 23 - -  Calcium 8.6 - 10.2 mg/dL 9.3 - -  Total Protein 6.0 - 8.3 g/dL - - -  Total Bilirubin 0.2 - 1.2 mg/dL - - -  Alkaline Phos 39 - 117 U/L - - -  AST 0 - 37 U/L - - -  ALT 0 - 53 U/L - - -   CBC Latest Ref Rng & Units 03/14/2021 03/14/2021 03/14/2021  WBC 3.4 - 10.8 x10E3/uL - - -  Hemoglobin 13.0 - 17.0 g/dL 10.9(L) 11.2(L) 11.2(L)  Hematocrit 39.0 - 52.0 % 32.0(L) 33.0(L) 33.0(L)  Platelets 150 - 450 x10E3/uL - - -   ProBNP    Component Value Date/Time   PROBNP 85 03/06/2021 1557   PROBNP 77.0 03/20/2017 0957    TSH No results for input(s): TSH in the last 8760 hours.  External labs:   Labs 12/26/2020:  BUN 24, serum glucose 106 mg, sodium 143, potassium  4.4, creatinine 1.47 EGFR 56 mL, CMP otherwise normal.  Uric acid normal at 4.1.  Hemoglobin 11.2/hematocrit 34.0, normal indicis.  Platelets 190.  Cholesterol, total 119.000 m 06/15/2020 HDL 36 MG/DL 06/15/2020 LDL 65.000 mg 06/15/2020 Triglycerides  88.000 06/15/2020   Hemoglobin 12.100 g/d 06/15/2020  Creatinine, Serum 1.300 mg/ 06/15/2020 Potassium 4.100 MM 01/24/2017 ALT (SGPT) 14.000 uni 06/15/2020  Medications and allergies   Allergies  Allergen Reactions  . Aleve [Naproxen Sodium]     Patient has been instructed to avoid Ibuprofen b/c of his Creatinine level.  . Ibuprofen Other (See Comments)    Patient has been instructed to avoid Ibuprofen b/c of his Creatinine level.  Marland Kitchen Furacin [Nitrofurazone] Rash     Outpatient Medications Prior to Visit  Medication Sig Dispense Refill  . allopurinol (ZYLOPRIM) 300 MG tablet Take 300 mg by mouth daily.     Marland Kitchen amLODipine (NORVASC) 5 MG tablet Take 1 tablet (5 mg total) by mouth daily. 30 tablet 2  . aspirin 81 MG tablet Take 81 mg by mouth daily.    . Calcium Carb-Cholecalciferol (CALCIUM 600/VITAMIN D3 PO) Take 1 tablet by mouth daily.     . clopidogrel (PLAVIX) 75 MG tablet Take 1 tablet (75 mg total) by mouth daily. 90 tablet 3  . ferrous sulfate 325 (65 FE) MG tablet Take 325 mg by mouth every Monday, Wednesday, and Friday.    . furosemide (LASIX) 40 MG tablet Take 40 mg by mouth as needed.    . labetalol (NORMODYNE) 100 MG tablet Take 1 tablet (100 mg total) by mouth 2 (two) times daily. 60 tablet 2  . losartan (COZAAR) 50 MG tablet Take 1 tablet (50 mg total) by mouth every evening. 90 tablet 3  . rosuvastatin (CRESTOR) 10 MG tablet Take 10 mg by mouth every Monday, Wednesday, and Friday.    . Torsemide 40 MG TABS Take 40 mg by mouth daily as needed (fluid).  6   No facility-administered medications prior to visit.   Radiology:   Lower extremity venous insufficiency study 02/27/2017: 1. Normal bilateral lower extremity deep venous  systems. No evidence of acute or chronic DVT. 2. Unremarkable bilateral lower extremity saphenous venous systems. No evidence of valvular incompetence, reflux, or significant varicose vein disease.  CT angiogram chest 03/22/2017: Cardiovascular: Heart size upper normal. No pericardial effusion. Coronary artery calcification is noted.  Atherosclerotic calcification is noted in the wall of the thoracic aorta.  No filling defect in the opacified pulmonary arteries to suggest the presence of an acute pulmonary embolus.  MRI neck 02/22/2021:  Multilevel degenerative change in the cervical spine. 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  Cardiac Studies:   Heart Cath  [2007]: at [facility] hosp: Normal coronary arteries.  Sleep Study  [[facility] at [facility]]: Severe sleep apnea  Exercise Myoview stress test 02/13/2021: Exercise nuclear stress test was performed using Bruce protocol. Patient reached 7 METS, and 101% of age predicted maximum heart rate. Exercise capacity was low. Non-limiting chest pain reported. Heart rate and hemodynamic response were normal. Peak EKG demonstrated sinus tachycardia, 2-3 mm horizontal/down-sloping ST depressions in leads II, III, aVF, V4-V6, partially normalize 2 min into recovery. SPECT images showed medium sized, mild intensity, mildly reversible perfusion defect in basal-mid inferoseptal, inferior, inferolateral myocardium. Stress LVEF is calculated 44%, although visually appears normal.  Intermediate risk study.   Compared to treadmill stress test in 2018, no significant change in exercise capacity although there were no ST-T wave changes of ischemia.  Echocardiogram 02/17/2021: Left ventricle cavity is normal in size. Moderate concentric hypertrophy of the left ventricle. Mild global hypokinesis. LVEF 45-50%. Normal  global wall  motion. Diastolic function not assessed due to severity of mitral regurgitation.  Left atrial cavity is severely dilated. Trileaflet aortic valve.  Moderate (Grade II) aortic regurgitation. Moderate (Grade III) mitral regurgitation. Mild tricuspid regurgitation. No evidence of pulmonary hypertension. Compared to previous study in 2017, LVEF is mildly reduced from 50-55%.   Right and left heart catheterization 03/14/2021:  Normal right heart catheterization with preserved cardiac output and cardiac index. LV: Mild decrease in LVEF, global hypokinesis, EF from 45% to at most 50%.  No significant mitral regurgitation.  Normal EDP.  No pressure gradient across the aortic valve. RCA: Large vessel, with mild diffuse disease. Left main: Large vessel, distal left main has 20 to 30% stenosis.  Mild calcification is evident. LAD: Large-caliber vessel giving origin to moderate to large size D1 and D2.  After the origin of a large D1, there is a 70%, appears to be high-grade intracranial views, mildly calcified lesion noted.  Otherwise there is mild disease.  IVUS revealing 67% stenosis with a minimum luminal area of 5.81 mm.  Lesion left alone. Circumflex: Very large caliber vessel giving origin to 2 large marginals.  The OM1 has proximal 30 to 40% stenosis.  Ostium of the circumflex has a calcific 70 or high-grade stenosis. IVUS confirmed high-grade stenosis, 4.0 x 4.0 mm vessel, tightest segment 1.7 by 1.7.  Minimum lumen 2.35 mm.  Post PCI lumen area 14.58 mm.  A 4.0 x 8 mm resolute Onyx DES was deployed extending into the distal left main and left main stent was optimized with a 5.0 x 8 mm balloon.  IVUS confirmed excellent apposition of the stent. Balloon angioplasty to ostial LAD to open up the stent struts with a 3.5 x 8 mm balloon.  IVUS confirmed no significant stent distortion.  Recommendation: Patient hopefully will get benefit from the circumflex PCI.  Mid LAD has a moderate to high-grade  stenosis, intermediate range, in view of absence of ischemia in the anterior territory by nuclear stress test we will leave it alone.  Continue medical therapy.  150 mL contrast utilized.  Patient needs back surgery, this will be postponed for at least 3 months if not 6 months.   EKG:   EKG 03/28/2021: Sinus rhythm, rate of 71 bpm. normal axis.  No evidence of underlying injury or ischemia pattern.  Compared to EKG 01/30/2021, no bradycardia.  EKG 01/30/2021: Sinus bradycardia at rate of 48 bpm, otherwise normal EKG.  Compared to 2018, heart rate was 52 bpm.  Assessment     ICD-10-CM   1. Coronary artery disease of native artery of native heart with stable angina pectoris ([place])  I25.118   2. Essential hypertension  I10   3. Dyspnea on exertion  R06.00   4. Stage 3a chronic kidney disease (HCC)  N18.31   5. 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 Torsemide 40 MG TABS Change in therapy    No orders of the defined types were placed in this encounter.  No orders of the defined types were placed in this encounter.  Recommendations:   Martin Stephenson is a 84 y.o. 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    He underwent successful revascularization of a very large circumflex coronary artery on  03/14/2021 and was seen by [place] about 2 weeks ago but wanted to come back and see me to further discuss disability issues with regard to Agent Orange exposure and its relationship to CAD and cardiomyopathy.  Patient has noticed marked improvement in symptoms of dyspnea since angioplasty, has not had any significant chest discomfort and has not used any sublingual nitroglycerin.  His wife is present at the bedside.  It is a very large  circumflex coronary artery that had high-grade lesion.  I encouraged him to increase his physical activity as tolerated.  His blood pressure is well controlled, lipids are at goal and he is on optimal medical therapy.  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  I would like to see him back in 8 weeks from now to evaluate him for surgery and I will discontinue dual antiplatelet therapy at that time in preparation for surgery.  With regard to agent orange exposure, he indeed has mild decrease in LVEF with cardiomyopathy with global hypokinesis, has had chronic dyspnea and even in 2013 prior cardiac catheterization had not revealed any significant CAD.  However he has now gone of CAD in the circumflex distribution.  Agent orange could be contributing to his cardiomyopathy and CAD as well.  He has requested that I fill forms for him for the [place] which I will do so at his request.      [name], PA-C 04/21/2021, 11:59 AM Office: [phone]

## 2021-04-21 NOTE — Progress Notes (Signed)
Subjective:   Patient ID: Martin Stephenson, male   DOB: 80 y.o.   MRN: [id number]   HPI Patient presents with severe callus formation subfourth metatarsal right fourth digit that are both painful when pressed and make walking difficult.  He needs to be active due to recent heart surgery and is having trouble walking on his foot.  Patient does not smoke needs to be active   Review of Systems  All other systems reviewed and are negative.       Objective:  Physical Exam Vitals and nursing note reviewed.  Constitutional:      Appearance: He is well-developed.  Pulmonary:     Effort: Pulmonary effort is normal.  Musculoskeletal:        General: Normal range of motion.  Skin:    General: Skin is warm.  Neurological:     Mental Status: He is alert.     Neurovascular status intact muscle strength was found to be adequate range of motion adequate.  Patient is found to have significant keratotic lesions of the fourth metatarsal right thick and painful hand fourth digit right with distal keratotic tissue formation.  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  Today I went ahead and using sharp sterile debridement I debrided 2 separate lesions no iatrogenic bleeding applied padding instructed on support shoes with cushion reappoint to recheck  X-rays indicated structural bunion deformity right over left with previous correction left that is done well and no other pathology noted except for moderate arthritis

## 2021-04-22 LAB — BASIC METABOLIC PANEL
BUN/Creatinine Ratio: 10 (ref 10–24)
BUN: 16 mg/dL (ref 8–27)
CO2: 23 mmol/L (ref 20–29)
Calcium: 9.2 mg/dL (ref 8.6–10.2)
Chloride: 104 mmol/L (ref 96–106)
Creatinine, Ser: 1.54 mg/dL — ABNORMAL HIGH (ref 0.76–1.27)
Glucose: 95 mg/dL (ref 65–99)
Potassium: 4.9 mmol/L (ref 3.5–5.2)
Sodium: 144 mmol/L (ref 134–144)
eGFR: 46 mL/min/{1.73_m2} — ABNORMAL LOW (ref >59)

## 2021-04-27 ENCOUNTER — Other Ambulatory Visit: Payer: Self-pay | Admitting: Cardiology

## 2021-04-27 ENCOUNTER — Other Ambulatory Visit: Payer: Self-pay | Admitting: Student

## 2021-04-27 DIAGNOSIS — I10 Essential (primary) hypertension: Secondary | ICD-10-CM

## 2021-04-27 NOTE — Progress Notes (Signed)
Inform patient his renal function trended down mildly. Please advise him to increase water intake and go to [facility] for repeat labs in 1 week.

## 2021-04-27 NOTE — Progress Notes (Signed)
Called and spoke to pt regarding lab work, pt voiced understanding.

## 2021-05-01 ENCOUNTER — Encounter (HOSPITAL_COMMUNITY): Payer: Self-pay

## 2021-05-10 LAB — BASIC METABOLIC PANEL
BUN/Creatinine Ratio: 14 (ref 10–24)
BUN: 22 mg/dL (ref 8–27)
CO2: 24 mmol/L (ref 20–29)
Calcium: 9 mg/dL (ref 8.6–10.2)
Chloride: 105 mmol/L (ref 96–106)
Creatinine, Ser: 1.55 mg/dL — ABNORMAL HIGH (ref 0.76–1.27)
Glucose: 116 mg/dL — ABNORMAL HIGH (ref 65–99)
Potassium: 4.4 mmol/L (ref 3.5–5.2)
Sodium: 142 mmol/L (ref 134–144)
eGFR: 46 mL/min/{1.73_m2} — ABNORMAL LOW (ref >59)

## 2021-05-10 NOTE — Progress Notes (Signed)
Please call patient and advise him that renal function has not improved.  Have him reduce losartan back to 25 mg once daily instead of 50 mg daily.  Also advised him to increase labetalol from 100 mg twice daily to 200 mg twice daily.

## 2021-05-11 NOTE — Progress Notes (Signed)
Spoke with patient wife, she wrote down new medication instructions for patient, he was not home.

## 2021-05-16 ENCOUNTER — Telehealth (HOSPITAL_COMMUNITY): Payer: Self-pay

## 2021-05-16 NOTE — Telephone Encounter (Signed)
Called and left message for pt on 05/01/2021, and mailed letter.

## 2021-05-25 ENCOUNTER — Telehealth (HOSPITAL_COMMUNITY): Payer: Self-pay | Admitting: Pharmacist

## 2021-06-01 ENCOUNTER — Ambulatory Visit (HOSPITAL_COMMUNITY): Payer: Medicare Other

## 2021-06-02 ENCOUNTER — Other Ambulatory Visit: Payer: Self-pay | Admitting: Cardiology

## 2021-06-02 DIAGNOSIS — I10 Essential (primary) hypertension: Secondary | ICD-10-CM

## 2021-06-05 ENCOUNTER — Ambulatory Visit (HOSPITAL_COMMUNITY): Payer: Medicare Other

## 2021-06-07 ENCOUNTER — Ambulatory Visit (HOSPITAL_COMMUNITY): Payer: Medicare Other

## 2021-06-09 ENCOUNTER — Other Ambulatory Visit: Payer: Self-pay | Admitting: Cardiology

## 2021-06-09 ENCOUNTER — Ambulatory Visit (HOSPITAL_COMMUNITY): Payer: Medicare Other

## 2021-06-09 DIAGNOSIS — I10 Essential (primary) hypertension: Secondary | ICD-10-CM

## 2021-06-12 ENCOUNTER — Ambulatory Visit (HOSPITAL_COMMUNITY): Payer: Medicare Other

## 2021-06-14 ENCOUNTER — Ambulatory Visit (HOSPITAL_COMMUNITY): Payer: Medicare Other

## 2021-06-16 ENCOUNTER — Encounter: Payer: Self-pay | Admitting: Cardiology

## 2021-06-16 ENCOUNTER — Ambulatory Visit: Payer: Medicare Other | Admitting: Cardiology

## 2021-06-16 ENCOUNTER — Other Ambulatory Visit: Payer: Self-pay

## 2021-06-16 ENCOUNTER — Ambulatory Visit (HOSPITAL_COMMUNITY): Payer: Medicare Other

## 2021-06-16 VITALS — BP 140/75 | HR 63 | Temp 98.1°F | Ht 76.0 in | Wt 250.0 lb

## 2021-06-16 DIAGNOSIS — I428 Other cardiomyopathies: Secondary | ICD-10-CM

## 2021-06-16 DIAGNOSIS — I10 Essential (primary) hypertension: Secondary | ICD-10-CM

## 2021-06-16 DIAGNOSIS — R0600 Dyspnea, unspecified: Secondary | ICD-10-CM

## 2021-06-16 DIAGNOSIS — R0609 Other forms of dyspnea: Secondary | ICD-10-CM

## 2021-06-16 DIAGNOSIS — I25118 Atherosclerotic heart disease of native coronary artery with other forms of angina pectoris: Secondary | ICD-10-CM

## 2021-06-16 MED ORDER — NEBIVOLOL HCL 20 MG PO TABS: 20.0000 mg | ORAL_TABLET | Freq: Every day | ORAL | 3 refills | Status: DC

## 2021-06-16 NOTE — Progress Notes (Signed)
Primary Physician/Referring:  Tisovec, Fransico Him, MD  Patient ID: Martin Stephenson, male    DOB: June 29, 1941, 80 y.o.   MRN: [id number]  Chief Complaint  Patient presents with   Hypertension   Follow-up   Coronary Artery Disease   HPI:    Martin Stephenson  is a 34 y.o. 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    Patient has noticed improvement in symptoms of dyspnea since angioplasty, has not had any significant chest discomfort and has not used any sublingual nitroglycerin.  His wife is present at the bedside.  He has not started his cardiac rehab yet.  He has noticed itching with labetalol in the scalp.  He has not had any chest discomfort but has noticed improvement in dyspnea but still dyspnea persist mildly.  No PND or orthopnea.  Leg edema is stable.  He is applying for disability through the [place] for exposure to agent orange.  Past Medical History:  Diagnosis Date   Allergy    Anemia    Arthritis    foot   Congestive heart failure (HCC)    Constipation    uses mag citrate OTC if needed   GERD (gastroesophageal reflux disease)    Hiatal hernia    Hyperlipidemia    Hypertension    Kidney stone    Sleep apnea    wears cpap   Past Surgical History:  Procedure Laterality Date   COLONOSCOPY     CORONARY ATHERECTOMY N/A 03/14/2021   Procedure: CORONARY ATHERECTOMY;  Surgeon: [name], MD;  Location: [facility];  Service: Cardiovascular;  Laterality: N/A;   CORONARY BALLOON ANGIOPLASTY N/A 03/14/2021   Procedure: CORONARY BALLOON ANGIOPLASTY;  Surgeon: [name], MD;  Location: [facility];  Service:  Cardiovascular;  Laterality: N/A;   CORONARY STENT INTERVENTION N/A 03/14/2021   Procedure: CORONARY STENT INTERVENTION;  Surgeon: [name], MD;  Location: [facility];  Service: Cardiovascular;  Laterality: N/A;   HAMMER TOE SURGERY  1998   1st toe right foot   hydrocelectomy  1988   INTRAVASCULAR ULTRASOUND/IVUS N/A 03/14/2021   Procedure: Intravascular Ultrasound/IVUS;  Surgeon: [name], MD;  Location: [facility];  Service: Cardiovascular;  Laterality: N/A;   POLYPECTOMY     RIGHT/LEFT HEART CATH AND CORONARY ANGIOGRAPHY N/A 03/14/2021   Procedure: RIGHT/LEFT HEART CATH AND CORONARY ANGIOGRAPHY;  Surgeon: [name], MD;  Location: [facility];  Service: Cardiovascular;  Laterality: N/A;   TONSILLECTOMY  1988   Family History  Problem Relation Age of Onset   Colon cancer Mother    Stomach cancer Neg Hx    Colon polyps Neg Hx    Esophageal cancer Neg Hx    Rectal cancer Neg Hx     Social History   Tobacco Use   Smoking status: Never   Smokeless tobacco: Never  Substance Use Topics   Alcohol use: No   Marital Status: Married  ROS  Review of Systems  Constitutional: Negative for malaise/fatigue and weight gain.  Cardiovascular:  Positive for dyspnea on exertion and leg swelling (chronic and uses support stockings). Negative for chest pain, claudication, orthopnea, palpitations and syncope.  Hematologic/Lymphatic:  Does not bruise/bleed easily.  Musculoskeletal:  Positive for joint pain (left knee > right).  Gastrointestinal:  Negative for melena.  Objective  Blood pressure 140/75, pulse 63, temperature 98.1 F (36.7 C), height _0  (1.93 m), weight 250 lb (113.4 kg), SpO2 97 %.  Vitals with BMI 06/16/2021 04/21/2021 03/28/2021  Height _1  _2  -  Weight 250 lbs 249 lbs -  BMI 95.18 84.16 -  Systolic 606 301 601  Diastolic 75 70 75  Pulse 63 75 73     Physical Exam Vitals reviewed.  HENT:     Head: Normocephalic and atraumatic.   Cardiovascular:     Rate and Rhythm: Normal rate and regular rhythm.     Pulses: Normal pulses and intact distal pulses.     Heart sounds: Normal heart sounds, S1 normal and S2 normal. No murmur heard.   No gallop.  Pulmonary:     Effort: Pulmonary effort is normal. No respiratory distress.     Breath sounds: Normal breath sounds. No wheezing, rhonchi or rales.  Abdominal:     General: Bowel sounds are normal.     Palpations: Abdomen is soft.  Musculoskeletal:     Right lower leg: Edema present.     Left lower leg: Edema present.  Skin:    General: Skin is warm and dry.  Neurological:     Mental Status: He is alert.   Laboratory examination:   Recent Labs    03/29/21 1044 04/21/21 1242 05/09/21 1349  NA 144 144 142  K 5.0 4.9 4.4  CL 107* 104 105  CO2 _3 GLUCOSE 83 95 116*  BUN _4 CREATININE 1.35* 1.54* 1.55*  CALCIUM 9.3 9.2 9.0   CrCl cannot be calculated (Patient's most recent lab result is older than the maximum 21 days allowed.).  CMP Latest Ref Rng & Units 05/09/2021 04/21/2021 03/29/2021  Glucose 65 - 99 mg/dL 116(H) 95 83  BUN 8 - 27 mg/dL _5 Creatinine 0.76 - 1.27 mg/dL 1.55(H) 1.54(H) 1.35(H)  Sodium 134 - 144 mmol/L 142 144 144  Potassium 3.5 - 5.2 mmol/L 4.4 4.9 5.0  Chloride 96 - 106 mmol/L 105 104 107(H)  CO2 20 - 29 mmol/L _6 Calcium 8.6 - 10.2 mg/dL 9.0 9.2 9.3  Total Protein 6.0 - 8.3 g/dL - - -  Total Bilirubin 0.2 - 1.2 mg/dL - - -  Alkaline Phos 39 - 117 U/L - - -  AST 0 - 37 U/L - - -  ALT 0 - 53 U/L - - -   CBC Latest Ref Rng & Units 03/14/2021 03/14/2021 03/14/2021  WBC 3.4 - 10.8 x10E3/uL - - -  Hemoglobin 13.0 - 17.0 g/dL 10.9(L) 11.2(L) 11.2(L)  Hematocrit 39.0 - 52.0 % 32.0(L) 33.0(L) 33.0(L)  Platelets 150 - 450 x10E3/uL - - -   ProBNP    Component Value Date/Time   PROBNP 85 03/06/2021 1557   PROBNP 77.0 03/20/2017 0957    TSH No results for input(s): TSH in the last 8760 hours.  External labs:    Labs 12/26/2020:  BUN 24, serum glucose 106 mg, sodium 143, potassium 4.4, creatinine 1.47 EGFR 56 mL, CMP otherwise normal.  Uric acid normal at 4.1.  Hemoglobin 11.2/hematocrit 34.0, normal indicis.  Platelets 190.  Cholesterol, total 119.000 m 06/15/2020 HDL 36 MG/DL 06/15/2020 LDL 65.000 mg 06/15/2020 Triglycerides 88.000 06/15/2020   Hemoglobin 12.100 g/d 06/15/2020  Creatinine, Serum 1.300 mg/  06/15/2020 Potassium 4.100 MM 01/24/2017 ALT (SGPT) 14.000 uni 06/15/2020  Medications and allergies   Allergies  Allergen Reactions   Labetalol Itching   Aleve [Naproxen Sodium]     Patient has been instructed to avoid Ibuprofen b/c of his Creatinine level.   Ibuprofen Other (See Comments)    Patient has been instructed to avoid Ibuprofen b/c of his Creatinine level.   Furacin [Nitrofurazone] Rash     Outpatient Medications Prior to Visit  Medication Sig Dispense Refill   allopurinol (ZYLOPRIM) 300 MG tablet Take 300 mg by mouth daily.      amLODipine (NORVASC) 5 MG tablet TAKE 1 TABLET BY MOUTH DAILY. 90 tablet 3   aspirin 81 MG tablet Take 81 mg by mouth daily.     Calcium Carb-Cholecalciferol (CALCIUM 600/VITAMIN D3 PO) Take 1 tablet by mouth daily.      clopidogrel (PLAVIX) 75 MG tablet Take 1 tablet (75 mg total) by mouth daily. 90 tablet 3   ferrous sulfate 325 (65 FE) MG tablet Take 325 mg by mouth every Monday, Wednesday, and Friday.     furosemide (LASIX) 40 MG tablet Take 40 mg by mouth as needed.     losartan (COZAAR) 50 MG tablet Take 1 tablet (50 mg total) by mouth every evening. 90 tablet 3   rosuvastatin (CRESTOR) 10 MG tablet Take 10 mg by mouth every Monday, Wednesday, and Friday.     labetalol (NORMODYNE) 100 MG tablet TAKE 1 TABLET BY MOUTH TWICE A DAY (Patient taking differently: Take 100 mg by mouth 3 (three) times daily.) 180 tablet 0   No facility-administered medications prior to visit.   Radiology:   Lower extremity venous insufficiency study  02/27/2017: 1. Normal bilateral lower extremity deep venous systems. No evidence of acute or chronic DVT. 2. Unremarkable bilateral lower extremity saphenous venous systems. No evidence of valvular incompetence, reflux, or significant varicose vein disease.  CT angiogram chest 03/22/2017: Cardiovascular: Heart size upper normal. No pericardial effusion. Coronary artery calcification is noted.  Atherosclerotic calcification is noted in the wall of the thoracic aorta.  No filling defect in the opacified pulmonary arteries to suggest the presence of an acute pulmonary embolus.  MRI neck 02/22/2021:  Multilevel degenerative change in the cervical spine. 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  Cardiac Studies:   Heart Cath   [2007]: at [facility] hosp: Normal coronary arteries.  Sleep Study   [[facility]]: Severe sleep apnea  Exercise Myoview stress test 02/13/2021: Exercise nuclear stress test was performed using Bruce protocol. Patient reached 7 METS, and 101% of age predicted maximum heart rate. Exercise capacity was low. Non-limiting chest pain reported. Heart rate and hemodynamic response were normal. Peak EKG demonstrated sinus tachycardia, 2-3 mm horizontal/down-sloping ST depressions in leads II, III, aVF, V4-V6, partially normalize 2 min into recovery. SPECT images showed medium sized, mild intensity, mildly reversible perfusion defect in basal-mid inferoseptal, inferior, inferolateral myocardium. Stress LVEF is calculated 44%, although visually appears normal.  Intermediate risk study.   Compared to treadmill stress test in 2018, no significant change in exercise capacity although there were no ST-T wave changes of ischemia.  Echocardiogram 02/17/2021: Left ventricle cavity is normal in size. Moderate concentric hypertrophy of the left ventricle.  Mild global hypokinesis. LVEF 45-50%. Normal global wall motion. Diastolic function not assessed due to severity of mitral regurgitation.  Left atrial cavity is  severely dilated. Trileaflet aortic valve.  Moderate (Grade II) aortic regurgitation. Moderate (Grade III) mitral regurgitation. Mild tricuspid regurgitation. No evidence of pulmonary hypertension. Compared to previous study in 2017, LVEF is mildly reduced from 50-55%.   Right and left heart catheterization 03/14/2021:  Normal right heart catheterization with preserved cardiac output and cardiac index. LV: Mild decrease in LVEF, global hypokinesis, EF from 45% to at most 50%.  No significant mitral regurgitation.  Normal EDP.  No pressure gradient across the aortic valve. RCA: Large vessel, with mild diffuse disease. Left main: Large vessel, distal left main has 20 to 30% stenosis.  Mild calcification is evident. LAD: Large-caliber vessel giving origin to moderate to large size D1 and D2.  After the origin of a large D1, there is a 70%, appears to be high-grade intracranial views, mildly calcified lesion noted.  Otherwise there is mild disease.  IVUS revealing 67% stenosis with a minimum luminal area of 5.81 mm.  Lesion left alone. Circumflex: Very large caliber vessel giving origin to 2 large marginals.  The OM1 has proximal 30 to 40% stenosis.  Ostium of the circumflex has a calcific 70 or high-grade stenosis. IVUS confirmed high-grade stenosis, 4.0 x 4.0 mm vessel, tightest segment 1.7 by 1.7.  Minimum lumen 2.35 mm.  Post PCI lumen area 14.58 mm.  A 4.0 x 8 mm resolute Onyx DES was deployed extending into the distal left main and left main stent was optimized with a 5.0 x 8 mm balloon.  IVUS confirmed excellent apposition of the stent. Balloon angioplasty to ostial LAD to open up the stent struts with a 3.5 x 8 mm balloon.  IVUS confirmed no significant stent distortion.   Recommendation: Patient hopefully will get benefit from the  circumflex PCI.  Mid LAD has a moderate to high-grade stenosis, intermediate range, in view of absence of ischemia in the anterior territory by nuclear stress test we will leave it alone.  Continue medical therapy.  150 mL contrast utilized.   Patient needs back surgery, this will be postponed for at least 3 months if not 6 months.   EKG:   EKG 06/16/2021: Sinus bradycardia at rate of 58 bpm, otherwise normal EKG.  No significant change from 03/28/2021.  EKG 01/30/2021: Sinus bradycardia at rate of 48 bpm, otherwise normal EKG.  Compared to 2018, heart rate was 52 bpm.  Assessment     ICD-10-CM   1. Coronary artery disease of native artery of native heart with stable angina pectoris (HCC)  I25.118 EKG 12-Lead    Nebivolol HCl (BYSTOLIC) 20 MG TABS    2. Other cardiomyopathy ([place])  I42.8     3. Dyspnea on exertion  R06.00     4. Essential hypertension  I10 Nebivolol HCl (BYSTOLIC) 20 MG TABS      Medications Discontinued During This Encounter  Medication Reason   labetalol (NORMODYNE) 100 MG tablet Side effect (s)    Meds ordered this encounter  Medications   Nebivolol HCl (BYSTOLIC) 20 MG TABS    Sig: Take 1 tablet (20 mg total) by mouth daily.    Dispense:  90 tablet    Refill:  3    Discontinue labetalol - itching   Orders Placed This Encounter  Procedures   EKG 12-Lead   Recommendations:   Martin Stephenson is a 26 y.o. 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    Patient has noticed improvement in symptoms of dyspnea since angioplasty, has not had any significant chest discomfort and has not used any sublingual nitroglycerin.   His wife is present at the bedside.  I have advised him that he can have neck surgery at any point he wants, patient would like to wait for now and he has not started cardiac rehab yet as he came down with COVID a month ago.  Advised him to contact the cardiac rehab and if any difficulty to let us know.  With regard to itching from labetalol, will discontinue labetalol and switch him to Bystolic 20 mg daily.  Advised him to monitor the blood pressure closely.  Otherwise his lipids are well controlled, no clinical evidence of heart failure.  I will see him back in 6 months.  He will let me know when he is having neck surgery and we can hold his dual antiplatelet therapy at that time.  Otherwise I prefer to continue dual antiplatelet therapy for 6 months.    [name], PA-C 06/16/2021, 9:16 AM Office: [phone]

## 2021-06-19 ENCOUNTER — Ambulatory Visit (HOSPITAL_COMMUNITY): Payer: Medicare Other

## 2021-06-19 ENCOUNTER — Encounter: Payer: Self-pay | Admitting: Cardiology

## 2021-06-19 DIAGNOSIS — I340 Nonrheumatic mitral (valve) insufficiency: Secondary | ICD-10-CM | POA: Insufficient documentation

## 2021-06-20 ENCOUNTER — Other Ambulatory Visit: Payer: Self-pay

## 2021-06-20 ENCOUNTER — Telehealth (HOSPITAL_COMMUNITY): Payer: Self-pay

## 2021-06-20 DIAGNOSIS — I10 Essential (primary) hypertension: Secondary | ICD-10-CM

## 2021-06-20 DIAGNOSIS — I25118 Atherosclerotic heart disease of native coronary artery with other forms of angina pectoris: Secondary | ICD-10-CM

## 2021-06-20 MED ORDER — NEBIVOLOL HCL 20 MG PO TABS: 20.0000 mg | ORAL_TABLET | Freq: Every day | ORAL | 3 refills | Status: DC

## 2021-06-20 NOTE — Telephone Encounter (Signed)
Pt called back and stated that he is able to reschedule for cardiac rehab. Pt will come in for orientation on 07/18/2021@9 :00am and start that 9:00am exercise class. Pt already has letter from previous scheduling.

## 2021-06-20 NOTE — Telephone Encounter (Signed)
Patient called was interested in participating in the Cardiac Rehab Program. Patient will come in for orientation on 07/18/2021@9 :00am and will attend the 9:00am exercise class.  Pt already has packet from previous scheduling.

## 2021-06-21 ENCOUNTER — Ambulatory Visit (HOSPITAL_COMMUNITY): Payer: Medicare Other

## 2021-06-23 ENCOUNTER — Ambulatory Visit (HOSPITAL_COMMUNITY): Payer: Medicare Other

## 2021-06-28 ENCOUNTER — Ambulatory Visit (HOSPITAL_COMMUNITY): Payer: Medicare Other

## 2021-06-30 ENCOUNTER — Ambulatory Visit (HOSPITAL_COMMUNITY): Payer: Medicare Other

## 2021-07-03 ENCOUNTER — Ambulatory Visit (HOSPITAL_COMMUNITY): Payer: Medicare Other

## 2021-07-05 ENCOUNTER — Ambulatory Visit (HOSPITAL_COMMUNITY): Payer: Medicare Other

## 2021-07-07 ENCOUNTER — Ambulatory Visit (HOSPITAL_COMMUNITY): Payer: Medicare Other

## 2021-07-10 ENCOUNTER — Ambulatory Visit (HOSPITAL_COMMUNITY): Payer: Medicare Other

## 2021-07-12 ENCOUNTER — Ambulatory Visit (HOSPITAL_COMMUNITY): Payer: Medicare Other

## 2021-07-14 ENCOUNTER — Ambulatory Visit (HOSPITAL_COMMUNITY): Payer: Medicare Other

## 2021-07-17 ENCOUNTER — Ambulatory Visit (HOSPITAL_COMMUNITY): Payer: Medicare Other

## 2021-07-18 ENCOUNTER — Encounter (HOSPITAL_COMMUNITY): Payer: Self-pay

## 2021-07-18 ENCOUNTER — Other Ambulatory Visit: Payer: Self-pay

## 2021-07-18 ENCOUNTER — Encounter (HOSPITAL_COMMUNITY)
Admission: RE | Admit: 2021-07-18 | Discharge: 2021-07-18 | Disposition: A | Discharge status: Home / Self Care | Payer: Medicare Other | Source: Ambulatory Visit | Attending: Cardiology | Admitting: Cardiology

## 2021-07-18 VITALS — BP 108/74 | HR 61 | Ht 75.0 in | Wt 251.3 lb

## 2021-07-18 DIAGNOSIS — Z955 Presence of coronary angioplasty implant and graft: Secondary | ICD-10-CM | POA: Insufficient documentation

## 2021-07-18 NOTE — Progress Notes (Signed)
Cardiac Individual Treatment Plan  Patient Details  Name: Martin Stephenson MRN: [id number] Date of Birth: [birth year] Referring Provider:   Flowsheet Row CARDIAC REHAB PHASE II ORIENTATION from 07/18/2021 in [facility]  Referring Provider [name], MD       Initial Encounter Date:  [place] PHASE II ORIENTATION from 07/18/2021 in [facility]  Date 07/18/21       Visit Diagnosis: 03/14/21 S/P DES CFX/LM  Patient's Home Medications on Admission:  Current Outpatient Medications:    allopurinol (ZYLOPRIM) 300 MG tablet, Take 300 mg by mouth daily. , Disp: , Rfl:    amLODipine (NORVASC) 5 MG tablet, TAKE 1 TABLET BY MOUTH DAILY., Disp: 90 tablet, Rfl: 3   aspirin 81 MG tablet, Take 81 mg by mouth daily., Disp: , Rfl:    Calcium Carb-Cholecalciferol (CALCIUM 600/VITAMIN D3 PO), Take 1 tablet by mouth daily. , Disp: , Rfl:    clopidogrel (PLAVIX) 75 MG tablet, Take 1 tablet (75 mg total) by mouth daily., Disp: 90 tablet, Rfl: 3   ferrous sulfate 325 (65 FE) MG tablet, Take 325 mg by mouth every Monday, Wednesday, and Friday., Disp: , Rfl:    furosemide (LASIX) 40 MG tablet, Take 40 mg by mouth as needed., Disp: , Rfl:    losartan (COZAAR) 50 MG tablet, Take 1 tablet (50 mg total) by mouth every evening., Disp: 90 tablet, Rfl: 3   rosuvastatin (CRESTOR) 10 MG tablet, Take 10 mg by mouth every Monday, Wednesday, and Friday., Disp: , Rfl:    Nebivolol HCl (BYSTOLIC) 20 MG TABS, Take 1 tablet (20 mg total) by mouth daily. (Patient not taking: Reported on 07/18/2021), Disp: 90 tablet, Rfl: 3  Past Medical History: Past Medical History:  Diagnosis Date   Allergy    Anemia    Arthritis    foot   Congestive heart failure (HCC)    Constipation    uses mag citrate OTC if needed   GERD (gastroesophageal reflux disease)    Hiatal hernia    Hyperlipidemia    Hypertension    Kidney stone    Sleep apnea    wears  cpap    Tobacco Use: Social History   Tobacco Use  Smoking Status Never  Smokeless Tobacco Never    Labs: Recent Review Flowsheet Data     Labs for ITP Cardiac and Pulmonary Rehab Latest Ref Rng & Units 03/14/2021 03/14/2021 03/14/2021   PHART 7.350 - 7.450 - - 7.361   PCO2ART 32.0 - 48.0 mmHg - - 44.1   HCO3 20.0 - 28.0 mmol/L 25.9 26.5 24.9   TCO2 22 - 32 mmol/L '27 28 26   '$ ACIDBASEDEF 0.0 - 2.0 mmol/L 1.0 - 1.0   O2SAT % 74.0 73.0 99.0       Capillary Blood Glucose: No results found for: GLUCAP   Exercise Target Goals: Exercise Program Goal: Individual exercise prescription set using results from initial 6 min walk test and THRR while considering  patient's activity barriers and safety.   Exercise Prescription Goal: Starting with aerobic activity 30 plus minutes a day, 3 days per week for initial exercise prescription. Provide home exercise prescription and guidelines that participant acknowledges understanding prior to discharge.  Activity Barriers & Risk Stratification:  Activity Barriers & Cardiac Risk Stratification - 07/18/21 1026       Activity Barriers & Cardiac Risk Stratification   Activity Barriers Arthritis;Back Problems;Neck/Spine Problems;Joint Problems;Deconditioning;Shortness of Breath;Balance Concerns;Other (comment)  Comments Uses a cane to walk, but only when knees become too painful.    Cardiac Risk Stratification High             6 Minute Walk:  6 Minute Walk     Row Name 07/18/21 0935         6 Minute Walk   Phase Initial     Distance 1000 feet     Walk Time 6 minutes     # of Rest Breaks 1  5:35-6:00     MPH 1.89     METS 1.71     RPE 11     Perceived Dyspnea  1     VO2 Peak 5.97     Symptoms Yes (comment)     Comments Low back tightness nad right knee tightness; states it is not pain. SOB with RPD = 1     Resting HR 73 bpm     Resting BP 108/74     Resting Oxygen Saturation  100 %     Exercise Oxygen Saturation  during 6  min walk 99 %     Max Ex. HR 81 bpm     Max Ex. BP 150/70     2 Minute Post BP 134/72              Oxygen Initial Assessment:   Oxygen Re-Evaluation:   Oxygen Discharge (Final Oxygen Re-Evaluation):   Initial Exercise Prescription:  Initial Exercise Prescription - 07/18/21 1000       Date of Initial Exercise RX and Referring Provider   Date 07/18/21    Referring Provider [name], MD    Expected Discharge Date 09/15/21      NuStep   Level 2    SPM 75    Minutes 25    METs 1.8      Prescription Details   Frequency (times per week) 3    Duration Progress to 30 minutes of continuous aerobic without signs/symptoms of physical distress      Intensity   THRR 40-80% of Max Heartrate 57-114    Ratings of Perceived Exertion 11-13    Perceived Dyspnea 0-4      Progression   Progression Continue progressive overload as per policy without signs/symptoms or physical distress.      Resistance Training   Training Prescription Yes    Weight 3 lbs    Reps 10-15             Perform Capillary Blood Glucose checks as needed.  Exercise Prescription Changes:   Exercise Comments:   Exercise Goals and Review:   Exercise Goals     Row Name 07/18/21 1027             Exercise Goals   Increase Physical Activity Yes       Intervention Provide advice, education, support and counseling about physical activity/exercise needs.;Develop an individualized exercise prescription for aerobic and resistive training based on initial evaluation findings, risk stratification, comorbidities and participant's personal goals.       Expected Outcomes Short Term: Attend rehab on a regular basis to increase amount of physical activity.;Long Term: Add in home exercise to make exercise part of routine and to increase amount of physical activity.;Long Term: Exercising regularly at least 3-5 days a week.       Increase Strength and Stamina Yes       Intervention Provide advice,  education, support and counseling about physical activity/exercise needs.;Develop an individualized exercise prescription for  aerobic and resistive training based on initial evaluation findings, risk stratification, comorbidities and participant's personal goals.       Expected Outcomes Short Term: Increase workloads from initial exercise prescription for resistance, speed, and METs.;Short Term: Perform resistance training exercises routinely during rehab and add in resistance training at home;Long Term: Improve cardiorespiratory fitness, muscular endurance and strength as measured by increased METs and functional capacity (6MWT)       Able to understand and use rate of perceived exertion (RPE) scale Yes       Intervention Provide education and explanation on how to use RPE scale       Expected Outcomes Short Term: Able to use RPE daily in rehab to express subjective intensity level;Long Term:  Able to use RPE to guide intensity level when exercising independently       Knowledge and understanding of Target Heart Rate Range (THRR) Yes       Intervention Provide education and explanation of THRR including how the numbers were predicted and where they are located for reference       Expected Outcomes Short Term: Able to state/look up THRR;Short Term: Able to use daily as guideline for intensity in rehab;Long Term: Able to use THRR to govern intensity when exercising independently       Understanding of Exercise Prescription Yes       Intervention Provide education, explanation, and written materials on patient's individual exercise prescription       Expected Outcomes Short Term: Able to explain program exercise prescription;Long Term: Able to explain home exercise prescription to exercise independently                Exercise Goals Re-Evaluation :    Discharge Exercise Prescription (Final Exercise Prescription Changes):   Nutrition:  Target Goals: Understanding of nutrition guidelines, daily  intake of sodium '1500mg'$ , cholesterol '200mg'$ , calories 30% from fat and 7% or less from saturated fats, daily to have 5 or more servings of fruits and vegetables.  Biometrics:  Pre Biometrics - 07/18/21 0900       Pre Biometrics   Waist Circumference 46 inches    Hip Circumference 49 inches    Waist to Hip Ratio 0.94 %    Triceps Skinfold 16 mm    % Body Fat 31.6 %    Grip Strength 47 kg    Flexibility 13.5 in    Single Leg Stand 3.93 seconds   High fal risk             Nutrition Therapy Plan and Nutrition Goals:   Nutrition Assessments:  MEDIFICTS Score Key: ?70 Need to make dietary changes  40-70 Heart Healthy Diet ? 40 Therapeutic Level Cholesterol Diet   Picture Your Plate Scores: D34-534 Unhealthy dietary pattern with much room for improvement. 41-50 Dietary pattern unlikely to meet recommendations for good health and room for improvement. 51-60 More healthful dietary pattern, with some room for improvement.  >60 Healthy dietary pattern, although there may be some specific behaviors that could be improved.    Nutrition Goals Re-Evaluation:   Nutrition Goals Discharge (Final Nutrition Goals Re-Evaluation):   Psychosocial: Target Goals: Acknowledge presence or absence of significant depression and/or stress, maximize coping skills, provide positive support system. Participant is able to verbalize types and ability to use techniques and skills needed for reducing stress and depression.  Initial Review & Psychosocial Screening:  Initial Psych Review & Screening - 07/18/21 1129       Initial Review   Current  issues with None Identified      Family Dynamics   Good Support System? Yes   Mr Lamacchia has his wife and children for support     Barriers   Psychosocial barriers to participate in program There are no identifiable barriers or psychosocial needs.      Screening Interventions   Interventions Encouraged to exercise             Quality of Life  Scores:  Quality of Life - 07/18/21 1019       Quality of Life   Select Quality of Life      Quality of Life Scores   Health/Function Pre 20.93 %    Socioeconomic Pre 24.43 %    Psych/Spiritual Pre 25.71 %    Family Pre 23.5 %    GLOBAL Pre 23.01 %            Scores of 19 and below usually indicate a poorer quality of life in these areas.  A difference of  2-3 points is a clinically meaningful difference.  A difference of 2-3 points in the total score of the Quality of Life Index has been associated with significant improvement in overall quality of life, self-image, physical symptoms, and general health in studies assessing change in quality of life.  PHQ-9: Recent Review Flowsheet Data     Depression screen [facility] 2/9 07/18/2021   Decreased Interest 0   Down, Depressed, Hopeless 0   PHQ - 2 Score 0      Interpretation of Total Score  Total Score Depression Severity:  1-4 = Minimal depression, 5-9 = Mild depression, 10-14 = Moderate depression, 15-19 = Moderately severe depression, 20-27 = Severe depression   Psychosocial Evaluation and Intervention:   Psychosocial Re-Evaluation:   Psychosocial Discharge (Final Psychosocial Re-Evaluation):   Vocational Rehabilitation: Provide vocational rehab assistance to qualifying candidates.   Vocational Rehab Evaluation & Intervention:  Vocational Rehab - 07/18/21 1130       Initial Vocational Rehab Evaluation & Intervention   Assessment shows need for Vocational Rehabilitation No   Ashlyn is semi retired and does not need vocational rehab at this time.            Education: Education Goals: Education classes will be provided on a weekly basis, covering required topics. Participant will state understanding/return demonstration of topics presented.  Learning Barriers/Preferences:  Learning Barriers/Preferences - 07/18/21 1021       Learning Barriers/Preferences   Learning Preferences  Computer/Internet;Pictoral;Video             Education Topics: Hypertension, Hypertension Reduction -Define heart disease and high blood pressure. Discus how high blood pressure affects the body and ways to reduce high blood pressure.   Exercise and Your Heart -Discuss why it is important to exercise, the FITT principles of exercise, normal and abnormal responses to exercise, and how to exercise safely.   Angina -Discuss definition of angina, causes of angina, treatment of angina, and how to decrease risk of having angina.   Cardiac Medications -Review what the following cardiac medications are used for, how they affect the body, and side effects that may occur when taking the medications.  Medications include Aspirin, Beta blockers, calcium channel blockers, ACE Inhibitors, angiotensin receptor blockers, diuretics, digoxin, and antihyperlipidemics.   Congestive Heart Failure -Discuss the definition of CHF, how to live with CHF, the signs and symptoms of CHF, and how keep track of weight and sodium intake.   Heart Disease and Intimacy -Discus the effect  sexual activity has on the heart, how changes occur during intimacy as we age, and safety during sexual activity.   Smoking Cessation / COPD -Discuss different methods to quit smoking, the health benefits of quitting smoking, and the definition of COPD.   Nutrition I: Fats -Discuss the types of cholesterol, what cholesterol does to the heart, and how cholesterol levels can be controlled.   Nutrition II: Labels -Discuss the different components of food labels and how to read food label   Heart Parts/Heart Disease and PAD -Discuss the anatomy of the heart, the pathway of blood circulation through the heart, and these are affected by heart disease.   Stress I: Signs and Symptoms -Discuss the causes of stress, how stress may lead to anxiety and depression, and ways to limit stress.   Stress II: Relaxation -Discuss  different types of relaxation techniques to limit stress.   Warning Signs of Stroke / TIA -Discuss definition of a stroke, what the signs and symptoms are of a stroke, and how to identify when someone is having stroke.   Knowledge Questionnaire Score:  Knowledge Questionnaire Score - 07/18/21 1020       Knowledge Questionnaire Score   Pre Score 19/24             Core Components/Risk Factors/Patient Goals at Admission:  Personal Goals and Risk Factors at Admission - 07/18/21 1020       Core Components/Risk Factors/Patient Goals on Admission    Weight Management Yes;Obesity;Weight Loss    Intervention Weight Management: Develop a combined nutrition and exercise program designed to reach desired caloric intake, while maintaining appropriate intake of nutrient and fiber, sodium and fats, and appropriate energy expenditure required for the weight goal.;Weight Management: Provide education and appropriate resources to help participant work on and attain dietary goals.;Weight Management/Obesity: Establish reasonable short term and long term weight goals.;Obesity: Provide education and appropriate resources to help participant work on and attain dietary goals.    Admit Weight 251 lb 5.2 oz (114 kg)    Expected Outcomes Understanding of distribution of calorie intake throughout the day with the consumption of 4-5 meals/snacks;Understanding recommendations for meals to include 15-35% energy as protein, 25-35% energy from fat, 35-60% energy from carbohydrates, less than '200mg'$  of dietary cholesterol, 20-35 gm of total fiber daily;Weight Loss: Understanding of general recommendations for a balanced deficit meal plan, which promotes 1-2 lb weight loss per week and includes a negative energy balance of [phone] kcal/d;Weight Maintenance: Understanding of the daily nutrition guidelines, which includes 25-35% calories from fat, 7% or less cal from saturated fats, less than '200mg'$  cholesterol, less than  1.5gm of sodium, & 5 or more servings of fruits and vegetables daily;Long Term: Adherence to nutrition and physical activity/exercise program aimed toward attainment of established weight goal;Short Term: Continue to assess and modify interventions until short term weight is achieved    Hypertension Yes    Intervention Provide education on lifestyle modifcations including regular physical activity/exercise, weight management, moderate sodium restriction and increased consumption of fresh fruit, vegetables, and low fat dairy, alcohol moderation, and smoking cessation.;Monitor prescription use compliance.    Expected Outcomes Short Term: Continued assessment and intervention until BP is < 140/48m HG in hypertensive participants. < 130/844mHG in hypertensive participants with diabetes, heart failure or chronic kidney disease.;Long Term: Maintenance of blood pressure at goal levels.    Lipids Yes    Intervention Provide education and support for participant on nutrition & aerobic/resistive exercise along with prescribed medications to achieve  LDL '70mg'$ , HDL >'40mg'$ .    Expected Outcomes Short Term: Participant states understanding of desired cholesterol values and is compliant with medications prescribed. Participant is following exercise prescription and nutrition guidelines.;Long Term: Cholesterol controlled with medications as prescribed, with individualized exercise RX and with personalized nutrition plan. Value goals: LDL < '70mg'$ , HDL > 40 mg.    Stress Yes    Intervention Offer individual and/or small group education and counseling on adjustment to heart disease, stress management and health-related lifestyle change. Teach and support self-help strategies.;Refer participants experiencing significant psychosocial distress to appropriate mental health specialists for further evaluation and treatment. When possible, include family members and significant others in education/counseling sessions.    Expected  Outcomes Short Term: Participant demonstrates changes in health-related behavior, relaxation and other stress management skills, ability to obtain effective social support, and compliance with psychotropic medications if prescribed.;Long Term: Emotional wellbeing is indicated by absence of clinically significant psychosocial distress or social isolation.             Core Components/Risk Factors/Patient Goals Review:    Core Components/Risk Factors/Patient Goals at Discharge (Final Review):    ITP Comments:  ITP Comments     Row Name 07/18/21 0927           ITP Comments Dr Fransico Him MD, Medical Director                Comments: Yu attended orientation on 07/18/2021 to review rules and guidelines for program.  Completed 6 minute walk test, Intitial ITP, and exercise prescription.  VSS. Telemetry-Sinus Rhythm with intermittent PVC's .  Asymptomatic. Will forward to Dr Irven Shelling office for review. Safety measures and social distancing in place per CDC guidelines. Barnet Pall, RN,BSN 07/18/2021 11:46 AM

## 2021-07-18 NOTE — Progress Notes (Signed)
Cardiac Rehab Medication Review by a Nurse  Does the patient  feel that his/her medications are working for him/her?  yes  Has the patient been experiencing any side effects to the medications prescribed?  no  Does the patient measure his/her own blood pressure or blood glucose at home?  yes   Does the patient have any problems obtaining medications due to transportation or finances?   no  Understanding of regimen: good Understanding of indications: good Potential of compliance: good    Nurse  comments: Mr Catino is taking his medications as prescribed and has a good understanding of what they are for. Mr Polito says he will finish taking his labetalol and then switch to bystolic per Dr Nadyne Coombes. Mr Casagrande will let is know when this      Harrell Gave 07/18/2021 9:28 AM

## 2021-07-19 ENCOUNTER — Ambulatory Visit (HOSPITAL_COMMUNITY): Payer: Medicare Other

## 2021-07-21 ENCOUNTER — Ambulatory Visit (HOSPITAL_COMMUNITY): Payer: Medicare Other

## 2021-07-24 ENCOUNTER — Encounter (HOSPITAL_COMMUNITY)
Admission: RE | Admit: 2021-07-24 | Discharge: 2021-07-24 | Disposition: A | Discharge status: Home / Self Care | Payer: Medicare Other | Source: Ambulatory Visit | Attending: Cardiology | Admitting: Cardiology

## 2021-07-24 ENCOUNTER — Other Ambulatory Visit: Payer: Self-pay

## 2021-07-24 ENCOUNTER — Ambulatory Visit (HOSPITAL_COMMUNITY): Payer: Medicare Other

## 2021-07-24 VITALS — Wt 254.2 lb

## 2021-07-24 DIAGNOSIS — Z955 Presence of coronary angioplasty implant and graft: Secondary | ICD-10-CM | POA: Insufficient documentation

## 2021-07-24 NOTE — Progress Notes (Addendum)
Daily Session Note  Patient Details  Name: Martin Stephenson MRN: [id number] Date of Birth: 1941/07/23 Referring Provider:   Flowsheet Row CARDIAC REHAB PHASE II ORIENTATION from 07/18/2021 in [place] [facility] [place]  Referring Provider [name], MD       Encounter Date: 07/24/2021  Check In:  Session Check In - 07/24/21 0848       Check-In   Supervising physician immediately available to respond to emergencies [facility] immediately available    Physician(s) Dr Alla Feeling    Location [facility]    Staff Present Barnet Pall, RN, Milus Glazier, MS, ACSM-CEP, CCRP, Exercise Physiologist;Kaylee Rosana Hoes, MS, ACSM-CEP, Exercise Physiologist;Olinty Celesta Aver, MS, ACSM CEP, Exercise Physiologist;Jetta Walker BS, ACSM EP-C, Exercise Physiologist    Virtual Visit No    Medication changes reported     No    Fall or balance concerns reported    No    Tobacco Cessation No Change    Current number of cigarettes/nicotine per day     0    Warm-up and Cool-down Performed on first and last piece of equipment   Cardiac Rehab Orientation   Resistance Training Performed Yes    VAD Patient? No    PAD/SET Patient? No      Pain Assessment   Currently in Pain? No/denies    Pain Score 0-No pain    Multiple Pain Sites No             Capillary Blood Glucose: No results found for this or any previous visit (from the past 24 hour(s)).   Exercise Prescription Changes - 07/24/21 1000       Response to Exercise   Blood Pressure (Admit) 130/66    Blood Pressure (Exercise) 140/70    Blood Pressure (Exit) 130/70    Heart Rate (Admit) 83 bpm    Heart Rate (Exercise) 78 bpm    Heart Rate (Exit) 60 bpm    Rating of Perceived Exertion (Exercise) 9    Symptoms None    Comments Pt's first day in the [facility] program    Duration Progress to 30 minutes of  aerobic without signs/symptoms of physical distress    Intensity THRR unchanged      Progression    Progression Continue to progress workloads to maintain intensity without signs/symptoms of physical distress.    Average METs 1.9      Resistance Training   Training Prescription Yes    Weight 3 lbs    Reps 10-15    Time 10 Minutes      Interval Training   Interval Training No      NuStep   Level 2    SPM 75    Minutes 25    METs 1.9             Social History   Tobacco Use  Smoking Status Never  Smokeless Tobacco Never    Goals Met:  Exercise tolerated well No report of cardiac concerns or symptoms Strength training completed today  Goals Unmet:  Not Applicable  Comments: Pt started cardiac rehab today.  Pt tolerated light exercise without difficulty. VSS, telemetry-Sinus Rhythm, asymptomatic.  Medication list reconciled. Pt denies barriers to medicaiton compliance.  PSYCHOSOCIAL ASSESSMENT:  PHQ-0. Pt exhibits positive coping skills, hopeful outlook with supportive family. No psychosocial needs identified at this time, no psychosocial interventions necessary.    Pt enjoys sports and watching basketball.   Pt oriented to exercise equipment and routine.  Understanding verbalized. Martin Stephenson will be absent on Wednesday as he as a doctor's appointment.Barnet Pall, RN,BSN 07/24/2021 4:59 PM    Dr. Fransico Him is Medical Director for Cardiac Rehab at [facility].

## 2021-07-25 ENCOUNTER — Ambulatory Visit (HOSPITAL_COMMUNITY): Payer: Medicare Other

## 2021-07-26 ENCOUNTER — Ambulatory Visit (HOSPITAL_COMMUNITY): Payer: Medicare Other

## 2021-07-26 ENCOUNTER — Encounter (HOSPITAL_COMMUNITY): Payer: Medicare Other

## 2021-07-28 ENCOUNTER — Ambulatory Visit (HOSPITAL_COMMUNITY): Payer: Medicare Other

## 2021-07-28 ENCOUNTER — Encounter (HOSPITAL_COMMUNITY)
Admission: RE | Admit: 2021-07-28 | Discharge: 2021-07-28 | Disposition: A | Discharge status: Home / Self Care | Payer: Medicare Other | Source: Ambulatory Visit | Attending: Cardiology | Admitting: Cardiology

## 2021-07-28 ENCOUNTER — Other Ambulatory Visit: Payer: Self-pay

## 2021-07-28 DIAGNOSIS — Z955 Presence of coronary angioplasty implant and graft: Secondary | ICD-10-CM | POA: Diagnosis not present

## 2021-07-28 NOTE — Progress Notes (Signed)
Martin Stephenson 80 y.o. male Nutrition Note  Diagnosis: DES CFX/LM  Past Medical History:  Diagnosis Date   Allergy    Anemia    Arthritis    foot   Congestive heart failure (HCC)    Constipation    uses mag citrate OTC if needed   GERD (gastroesophageal reflux disease)    Hiatal hernia    Hyperlipidemia    Hypertension    Kidney stone    Sleep apnea    wears cpap     Medications reviewed.   Current Outpatient Medications:    allopurinol (ZYLOPRIM) 300 MG tablet, Take 300 mg by mouth daily. , Disp: , Rfl:    amLODipine (NORVASC) 5 MG tablet, TAKE 1 TABLET BY MOUTH DAILY., Disp: 90 tablet, Rfl: 3   aspirin 81 MG tablet, Take 81 mg by mouth daily., Disp: , Rfl:    Calcium Carb-Cholecalciferol (CALCIUM 600/VITAMIN D3 PO), Take 1 tablet by mouth daily. , Disp: , Rfl:    clopidogrel (PLAVIX) 75 MG tablet, Take 1 tablet (75 mg total) by mouth daily., Disp: 90 tablet, Rfl: 3   ferrous sulfate 325 (65 FE) MG tablet, Take 325 mg by mouth every Monday, Wednesday, and Friday., Disp: , Rfl:    furosemide (LASIX) 40 MG tablet, Take 40 mg by mouth as needed., Disp: , Rfl:    losartan (COZAAR) 50 MG tablet, Take 1 tablet (50 mg total) by mouth every evening., Disp: 90 tablet, Rfl: 3   Nebivolol HCl (BYSTOLIC) 20 MG TABS, Take 1 tablet (20 mg total) by mouth daily. (Patient not taking: Reported on 07/18/2021), Disp: 90 tablet, Rfl: 3   rosuvastatin (CRESTOR) 10 MG tablet, Take 10 mg by mouth every Monday, Wednesday, and Friday., Disp: , Rfl:    Ht Readings from Last 1 Encounters:  07/18/21 '6\' 3"'$  (1.905 m)     Wt Readings from Last 3 Encounters:  07/18/21 251 lb 5.2 oz (114 kg)  06/16/21 250 lb (113.4 kg)  04/21/21 249 lb (112.9 kg)     There is no height or weight on file to calculate BMI.   Social History   Tobacco Use  Smoking Status Never  Smokeless Tobacco Never     No results found for: CHOL No results found for: HDL No results found for: LDLCALC No results found for:  TRIG   No results found for: HGBA1C   CBG (last 3)  No results for input(s): GLUCAP in the last 72 hours.   Nutrition Note  Spoke with pt. Nutrition Plan and Nutrition Survey goals reviewed with pt. Pt has been making heart healthy diet changes with the support of his wife. They have tried to use less salt by adding rice to the salt shaker. He has been buying less soda to reduce sugary beverages. He tries to water down his koolaid. He still drinks 100% fruit juice daily.  Per discussion, pt does use canned/convenience foods often. Pt does add salt to food. Pt eats out 2 times per week. He does eat processed meats most mornings for breakfast. He has already been thinking about ways to make a healthier choice.  He tries to get 1/4 plate veggies with dinner. He does not eat much fruit. We discussed fresh/frozen over heavy syrups.  He eats more chicken and red meat. He eats fried fish on Friday.  Diet recall: Breakfast: honey wheat toast, 2 eggs, breakfast meat, coffee with splash cream 2 tsp sugar Lunch: snacks -- little debbies Dinner: Cheeseburger (frozen  patty) with lettuce and tomato and mayo on honey wheat bread. Normally he has veggies at this meal.  Drinks: coke, koolaid, coffee, juice   Pt expressed understanding of the information reviewed.    Nutrition Diagnosis Excessive mineral intake (sodium) related to overconsumption of highly processed foods and salt shaker [place] as evidenced by diet recall and pt report   Nutrition Intervention Pt's individual nutrition plan reviewed with pt. Benefits of adopting Heart Healthy diet discussed when Picture Your Plate reviewed.  Continue client-centered nutrition education by RD, as part of interdisciplinary care.  Goal(s) Pt to build a healthy plate including vegetables, fruits, whole grains, and low-fat dairy products in a heart healthy meal plan. Pt to reduce processed and red meat only 2 times per week Pt to reduce salt shaker use by  tasting food and shaking salt into his palm   Plan:  Will provide client-centered nutrition education as part of interdisciplinary care Monitor and evaluate progress toward nutrition goal with team.   Michaele Offer, MS, RDN, LDN

## 2021-07-30 ENCOUNTER — Other Ambulatory Visit: Payer: Self-pay | Admitting: Cardiology

## 2021-07-31 ENCOUNTER — Encounter (HOSPITAL_COMMUNITY)
Admission: RE | Admit: 2021-07-31 | Discharge: 2021-07-31 | Disposition: A | Discharge status: Home / Self Care | Payer: Medicare Other | Source: Ambulatory Visit | Attending: Cardiology | Admitting: Cardiology

## 2021-07-31 ENCOUNTER — Ambulatory Visit (HOSPITAL_COMMUNITY): Payer: Medicare Other

## 2021-07-31 ENCOUNTER — Other Ambulatory Visit: Payer: Self-pay

## 2021-07-31 DIAGNOSIS — Z955 Presence of coronary angioplasty implant and graft: Secondary | ICD-10-CM | POA: Diagnosis not present

## 2021-08-02 ENCOUNTER — Ambulatory Visit (HOSPITAL_COMMUNITY): Payer: Medicare Other

## 2021-08-02 ENCOUNTER — Encounter (HOSPITAL_COMMUNITY): Payer: Medicare Other

## 2021-08-04 ENCOUNTER — Encounter (HOSPITAL_COMMUNITY)
Admission: RE | Admit: 2021-08-04 | Discharge: 2021-08-04 | Disposition: A | Discharge status: Home / Self Care | Payer: Medicare Other | Source: Ambulatory Visit | Attending: Cardiology | Admitting: Cardiology

## 2021-08-04 ENCOUNTER — Ambulatory Visit (HOSPITAL_COMMUNITY): Payer: Medicare Other

## 2021-08-04 ENCOUNTER — Other Ambulatory Visit: Payer: Self-pay

## 2021-08-04 DIAGNOSIS — Z955 Presence of coronary angioplasty implant and graft: Secondary | ICD-10-CM

## 2021-08-07 ENCOUNTER — Other Ambulatory Visit: Payer: Self-pay

## 2021-08-07 ENCOUNTER — Ambulatory Visit (HOSPITAL_COMMUNITY): Payer: Medicare Other

## 2021-08-07 ENCOUNTER — Encounter (HOSPITAL_COMMUNITY)
Admission: RE | Admit: 2021-08-07 | Discharge: 2021-08-07 | Disposition: A | Discharge status: Home / Self Care | Payer: Medicare Other | Source: Ambulatory Visit | Attending: Cardiology | Admitting: Cardiology

## 2021-08-07 DIAGNOSIS — Z955 Presence of coronary angioplasty implant and graft: Secondary | ICD-10-CM | POA: Diagnosis not present

## 2021-08-07 NOTE — Progress Notes (Signed)
Cardiac Individual Treatment Plan  Patient Details  Name: Martin Stephenson MRN: [id number] Date of Birth: 1941/07/28 Referring Provider:   Flowsheet Row CARDIAC REHAB PHASE II ORIENTATION from 07/18/2021 in [place]  Referring Provider [name], MD       Initial Encounter Date:  [place] PHASE II ORIENTATION from 07/18/2021 in [place]  Date 07/18/21       Visit Diagnosis: 03/14/21 S/P DES CFX/LM  Patient's Home Medications on Admission:  Current Outpatient Medications:    allopurinol (ZYLOPRIM) 300 MG tablet, Take 300 mg by mouth daily. , Disp: , Rfl:    amLODipine (NORVASC) 5 MG tablet, TAKE 1 TABLET BY MOUTH DAILY., Disp: 90 tablet, Rfl: 3   aspirin 81 MG tablet, Take 81 mg by mouth daily., Disp: , Rfl:    Calcium Carb-Cholecalciferol (CALCIUM 600/VITAMIN D3 PO), Take 1 tablet by mouth daily. , Disp: , Rfl:    clopidogrel (PLAVIX) 75 MG tablet, Take 1 tablet (75 mg total) by mouth daily., Disp: 90 tablet, Rfl: 3   ferrous sulfate 325 (65 FE) MG tablet, Take 325 mg by mouth every Monday, Wednesday, and Friday., Disp: , Rfl:    furosemide (LASIX) 40 MG tablet, Take 40 mg by mouth as needed., Disp: , Rfl:    losartan (COZAAR) 25 MG tablet, TAKE 1 TABLET BY MOUTH EVERY DAY IN THE EVENING, Disp: 90 tablet, Rfl: 1   losartan (COZAAR) 50 MG tablet, Take 1 tablet (50 mg total) by mouth every evening., Disp: 90 tablet, Rfl: 3   Nebivolol HCl (BYSTOLIC) 20 MG TABS, Take 1 tablet (20 mg total) by mouth daily. (Patient not taking: Reported on 07/18/2021), Disp: 90 tablet, Rfl: 3   rosuvastatin (CRESTOR) 10 MG tablet, Take 10 mg by mouth every Monday, Wednesday, and Friday., Disp: , Rfl:   Past Medical History: Past Medical History:  Diagnosis Date   Allergy    Anemia    Arthritis    foot   Congestive heart failure (HCC)    Constipation    uses mag citrate OTC if needed   GERD (gastroesophageal reflux  disease)    Hiatal hernia    Hyperlipidemia    Hypertension    Kidney stone    Sleep apnea    wears cpap    Tobacco Use: Social History   Tobacco Use  Smoking Status Never  Smokeless Tobacco Never    Labs: Recent Review Flowsheet Data     Labs for ITP Cardiac and Pulmonary Rehab Latest Ref Rng & Units 03/14/2021 03/14/2021 03/14/2021   PHART 7.350 - 7.450 - - 7.361   PCO2ART 32.0 - 48.0 mmHg - - 44.1   HCO3 20.0 - 28.0 mmol/L 25.9 26.5 24.9   TCO2 22 - 32 mmol/L '27 28 26   '$ ACIDBASEDEF 0.0 - 2.0 mmol/L 1.0 - 1.0   O2SAT % 74.0 73.0 99.0       Capillary Blood Glucose: No results found for: GLUCAP   Exercise Target Goals: Exercise Program Goal: Individual exercise prescription set using results from initial 6 min walk test and THRR while considering  patient's activity barriers and safety.   Exercise Prescription Goal: Initial exercise prescription builds to 30-45 minutes a day of aerobic activity, 2-3 days per week.  Home exercise guidelines will be given to patient during program as part of exercise prescription that the participant will acknowledge.  Activity Barriers & Risk Stratification:  Activity Barriers & Cardiac  Risk Stratification - 07/18/21 1026       Activity Barriers & Cardiac Risk Stratification   Activity Barriers Arthritis;Back Problems;Neck/Spine Problems;Joint Problems;Deconditioning;Shortness of Breath;Balance Concerns;Other (comment)    Comments Uses a cane to walk, but only when knees become too painful.    Cardiac Risk Stratification High             6 Minute Walk:  6 Minute Walk     Row Name 07/18/21 0935         6 Minute Walk   Phase Initial     Distance 1000 feet     Walk Time 6 minutes     # of Rest Breaks 1  5:35-6:00     MPH 1.89     METS 1.71     RPE 11     Perceived Dyspnea  1     VO2 Peak 5.97     Symptoms Yes (comment)     Comments Low back tightness nad right knee tightness; states it is not pain. SOB with RPD = 1      Resting HR 73 bpm     Resting BP 108/74     Resting Oxygen Saturation  100 %     Exercise Oxygen Saturation  during 6 min walk 99 %     Max Ex. HR 81 bpm     Max Ex. BP 150/70     2 Minute Post BP 134/72              Oxygen Initial Assessment:   Oxygen Re-Evaluation:   Oxygen Discharge (Final Oxygen Re-Evaluation):   Initial Exercise Prescription:  Initial Exercise Prescription - 07/18/21 1000       Date of Initial Exercise RX and Referring Provider   Date 07/18/21    Referring Provider [name], MD    Expected Discharge Date 09/15/21      NuStep   Level 2    SPM 75    Minutes 25    METs 1.8      Prescription Details   Frequency (times per week) 3    Duration Progress to 30 minutes of continuous aerobic without signs/symptoms of physical distress      Intensity   THRR 40-80% of Max Heartrate 57-114    Ratings of Perceived Exertion 11-13    Perceived Dyspnea 0-4      Progression   Progression Continue progressive overload as per policy without signs/symptoms or physical distress.      Resistance Training   Training Prescription Yes    Weight 3 lbs    Reps 10-15             Perform Capillary Blood Glucose checks as needed.  Exercise Prescription Changes:   Exercise Prescription Changes     Row Name 07/24/21 1000 08/04/21 1500           Response to Exercise   Blood Pressure (Admit) 130/66 148/62      Blood Pressure (Exercise) 140/70 140/70      Blood Pressure (Exit) 130/70 122/78      Heart Rate (Admit) 83 bpm 65 bpm      Heart Rate (Exercise) 78 bpm 90 bpm      Heart Rate (Exit) 60 bpm 56 bpm      Rating of Perceived Exertion (Exercise) 9 11      Symptoms None None      Comments Pt's first day in the CRP2 program Reviewed METs  Duration Progress to 30 minutes of  aerobic without signs/symptoms of physical distress Continue with 30 min of aerobic exercise without signs/symptoms of physical distress.      Intensity THRR unchanged  THRR unchanged             Progression   Progression Continue to progress workloads to maintain intensity without signs/symptoms of physical distress. Continue to progress workloads to maintain intensity without signs/symptoms of physical distress.      Average METs 1.9 2.5             Resistance Training   Training Prescription Yes Yes      Weight 3 lbs 3 lbs      Reps 10-15 10-15      Time 10 Minutes 10 Minutes             Interval Training   Interval Training No No             NuStep   Level 2 3      SPM 75 85      Minutes 25 30      METs 1.9 2.5               Exercise Comments:   Exercise Comments     Row Name 07/24/21 1029 08/04/21 1533         Exercise Comments Pt's first day in the CRP2 program, No complaints with todays session and off to a good start. Reviewed METs. Pt is making good progress in the CRP2 program.               Exercise Goals and Review:   Exercise Goals     Row Name 07/18/21 1027             Exercise Goals   Increase Physical Activity Yes       Intervention Provide advice, education, support and counseling about physical activity/exercise needs.;Develop an individualized exercise prescription for aerobic and resistive training based on initial evaluation findings, risk stratification, comorbidities and participant's personal goals.       Expected Outcomes Short Term: Attend rehab on a regular basis to increase amount of physical activity.;Long Term: Add in home exercise to make exercise part of routine and to increase amount of physical activity.;Long Term: Exercising regularly at least 3-5 days a week.       Increase Strength and Stamina Yes       Intervention Provide advice, education, support and counseling about physical activity/exercise needs.;Develop an individualized exercise prescription for aerobic and resistive training based on initial evaluation findings, risk stratification, comorbidities and participant's personal  goals.       Expected Outcomes Short Term: Increase workloads from initial exercise prescription for resistance, speed, and METs.;Short Term: Perform resistance training exercises routinely during rehab and add in resistance training at home;Long Term: Improve cardiorespiratory fitness, muscular endurance and strength as measured by increased METs and functional capacity (6MWT)       Able to understand and use rate of perceived exertion (RPE) scale Yes       Intervention Provide education and explanation on how to use RPE scale       Expected Outcomes Short Term: Able to use RPE daily in rehab to express subjective intensity level;Long Term:  Able to use RPE to guide intensity level when exercising independently       Knowledge and understanding of Target Heart Rate Range (THRR) Yes       Intervention Provide education and  explanation of THRR including how the numbers were predicted and where they are located for reference       Expected Outcomes Short Term: Able to state/look up THRR;Short Term: Able to use daily as guideline for intensity in rehab;Long Term: Able to use THRR to govern intensity when exercising independently       Understanding of Exercise Prescription Yes       Intervention Provide education, explanation, and written materials on patient's individual exercise prescription       Expected Outcomes Short Term: Able to explain program exercise prescription;Long Term: Able to explain home exercise prescription to exercise independently                Exercise Goals Re-Evaluation :  Exercise Goals Re-Evaluation     Row Name 07/24/21 1022             Exercise Goal Re-Evaluation   Exercise Goals Review Increase Physical Activity;Increase Strength and Stamina;Able to understand and use rate of perceived exertion (RPE) scale;Knowledge and understanding of Target Heart Rate Range (THRR);Understanding of Exercise Prescription       Comments Pt's first day in the CRP2 program. Pt  understands the exercise Rx, THRR, and RPE scale.       Expected Outcomes WIll continue to monitor and progress exercise workloads as tolerated.                Discharge Exercise Prescription (Final Exercise Prescription Changes):  Exercise Prescription Changes - 08/04/21 1500       Response to Exercise   Blood Pressure (Admit) 148/62    Blood Pressure (Exercise) 140/70    Blood Pressure (Exit) 122/78    Heart Rate (Admit) 65 bpm    Heart Rate (Exercise) 90 bpm    Heart Rate (Exit) 56 bpm    Rating of Perceived Exertion (Exercise) 11    Symptoms None    Comments Reviewed METs    Duration Continue with 30 min of aerobic exercise without signs/symptoms of physical distress.    Intensity THRR unchanged      Progression   Progression Continue to progress workloads to maintain intensity without signs/symptoms of physical distress.    Average METs 2.5      Resistance Training   Training Prescription Yes    Weight 3 lbs    Reps 10-15    Time 10 Minutes      Interval Training   Interval Training No      NuStep   Level 3    SPM 85    Minutes 30    METs 2.5             Nutrition:  Target Goals: Understanding of nutrition guidelines, daily intake of sodium '1500mg'$ , cholesterol '200mg'$ , calories 30% from fat and 7% or less from saturated fats, daily to have 5 or more servings of fruits and vegetables.  Biometrics:  Pre Biometrics - 07/18/21 0900       Pre Biometrics   Waist Circumference 46 inches    Hip Circumference 49 inches    Waist to Hip Ratio 0.94 %    Triceps Skinfold 16 mm    % Body Fat 31.6 %    Grip Strength 47 kg    Flexibility 13.5 in    Single Leg Stand 3.93 seconds   High fal risk             Nutrition Therapy Plan and Nutrition Goals:  Nutrition Therapy & Goals - 07/28/21 1005  Nutrition Therapy   Diet TLC    Drug/Food Interactions Statins/Certain Fruits      Personal Nutrition Goals   Nutrition Goal Pt to build a healthy  plate including vegetables, fruits, whole grains, and low-fat dairy products in a heart healthy meal plan.    Personal Goal #2 Pt to reduce processed and red meat only 2 times per week    Personal Goal #3 Pt to reduce salt shaker use by tasting food and shaking salt into his palm      Intervention Plan   Intervention Prescribe, educate and counsel regarding individualized specific dietary modifications aiming towards targeted core components such as weight, hypertension, lipid management, diabetes, heart failure and other comorbidities.;Nutrition handout(s) given to patient.    Expected Outcomes Short Term Goal: A plan has been developed with personal nutrition goals set during dietitian appointment.;Long Term Goal: Adherence to prescribed nutrition plan.             Nutrition Assessments:  MEDIFICTS Score Key: ?70 Need to make dietary changes  40-70 Heart Healthy Diet ? 40 Therapeutic Level Cholesterol Diet   Flowsheet Row CARDIAC REHAB PHASE II EXERCISE from 07/28/2021 in [place]  Picture Your Plate Total Score on Admission 44      Picture Your Plate Scores: [id number] Unhealthy dietary pattern with much room for improvement. 41-50 Dietary pattern unlikely to meet recommendations for good health and room for improvement. 51-60 More healthful dietary pattern, with some room for improvement.  >60 Healthy dietary pattern, although there may be some specific behaviors that could be improved.    Nutrition Goals Re-Evaluation:  Nutrition Goals Re-Evaluation     [place] Name 07/28/21 1006 08/03/21 0853           Goals   Current Weight 251 lb (113.9 kg) 254 lb 3.1 oz (115.3 kg)      Nutrition Goal Pt to build a healthy plate including vegetables, fruits, whole grains, and low-fat dairy products in a heart healthy meal plan. Pt to build a healthy plate including vegetables, fruits, whole grains, and low-fat dairy products in a heart healthy meal plan.              Personal Goal #2 Re-Evaluation   Personal Goal #2 Pt to reduce processed and red meat only 2 times per week Pt to reduce processed and red meat only 2 times per week             Personal Goal #3 Re-Evaluation   Personal Goal #3 Pt to reduce salt shaker use by tasting food and shaking salt into his palm Pt to reduce salt shaker use by tasting food and shaking salt into his palm               Nutrition Goals Re-Evaluation:  Nutrition Goals Re-Evaluation     [place] Name 07/28/21 1006 08/03/21 0853           Goals   Current Weight 251 lb (113.9 kg) 254 lb 3.1 oz (115.3 kg)      Nutrition Goal Pt to build a healthy plate including vegetables, fruits, whole grains, and low-fat dairy products in a heart healthy meal plan. Pt to build a healthy plate including vegetables, fruits, whole grains, and low-fat dairy products in a heart healthy meal plan.             Personal Goal #2 Re-Evaluation   Personal Goal #2 Pt to reduce processed and red meat only 2  times per week Pt to reduce processed and red meat only 2 times per week             Personal Goal #3 Re-Evaluation   Personal Goal #3 Pt to reduce salt shaker use by tasting food and shaking salt into his palm Pt to reduce salt shaker use by tasting food and shaking salt into his palm               Nutrition Goals Discharge (Final Nutrition Goals Re-Evaluation):  Nutrition Goals Re-Evaluation - 08/03/21 0853       Goals   Current Weight 254 lb 3.1 oz (115.3 kg)    Nutrition Goal Pt to build a healthy plate including vegetables, fruits, whole grains, and low-fat dairy products in a heart healthy meal plan.      Personal Goal #2 Re-Evaluation   Personal Goal #2 Pt to reduce processed and red meat only 2 times per week      Personal Goal #3 Re-Evaluation   Personal Goal #3 Pt to reduce salt shaker use by tasting food and shaking salt into his palm             Psychosocial: Target Goals: Acknowledge presence or  absence of significant depression and/or stress, maximize coping skills, provide positive support system. Participant is able to verbalize types and ability to use techniques and skills needed for reducing stress and depression.  Initial Review & Psychosocial Screening:  Initial Psych Review & Screening - 07/18/21 1129       Initial Review   Current issues with None Identified      Family Dynamics   Good Support System? Yes   Martin Stephenson has his wife and children for support     Barriers   Psychosocial barriers to participate in program There are no identifiable barriers or psychosocial needs.      Screening Interventions   Interventions Encouraged to exercise             Quality of Life Scores:  Quality of Life - 07/18/21 1019       Quality of Life   Select Quality of Life      Quality of Life Scores   Health/Function Pre 20.93 %    Socioeconomic Pre 24.43 %    Psych/Spiritual Pre 25.71 %    Family Pre 23.5 %    GLOBAL Pre 23.01 %            Scores of 19 and below usually indicate a poorer quality of life in these areas.  A difference of  2-3 points is a clinically meaningful difference.  A difference of 2-3 points in the total score of the Quality of Life Index has been associated with significant improvement in overall quality of life, self-image, physical symptoms, and general health in studies assessing change in quality of life.  PHQ-9: Recent Review Flowsheet Data     Depression screen [facility] 2/9 07/18/2021   Decreased Interest 0   Down, Depressed, Hopeless 0   PHQ - 2 Score 0      Interpretation of Total Score  Total Score Depression Severity:  1-4 = Minimal depression, 5-9 = Mild depression, 10-14 = Moderate depression, 15-19 = Moderately severe depression, 20-27 = Severe depression   Psychosocial Evaluation and Intervention:   Psychosocial Re-Evaluation:  Psychosocial Re-Evaluation     [place] Name 08/08/21 0717             Psychosocial  Re-Evaluation   Current issues  with None Identified       Interventions Encouraged to attend Cardiac Rehabilitation for the exercise       Continue Psychosocial Services  No Follow up required                Psychosocial Discharge (Final Psychosocial Re-Evaluation):  Psychosocial Re-Evaluation - 08/08/21 0717       Psychosocial Re-Evaluation   Current issues with None Identified    Interventions Encouraged to attend Cardiac Rehabilitation for the exercise    Continue Psychosocial Services  No Follow up required             Vocational Rehabilitation: Provide vocational rehab assistance to qualifying candidates.   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            Education: Education Goals: Education classes will be provided on a weekly basis, covering required topics. Participant will state understanding/return demonstration of topics presented.  Learning Barriers/Preferences:  Learning Barriers/Preferences - 07/18/21 1021       Learning Barriers/Preferences   Learning Preferences Computer/Internet;Pictoral;Video             Education Topics: Count Your Pulse:  -Group instruction provided by verbal instruction, demonstration, patient participation and written materials to support subject.  Instructors address importance of being able to find your pulse and how to count your pulse when at home without a heart monitor.  Patients get hands on experience counting their pulse with staff help and individually.   Heart Attack, Angina, and Risk Factor Modification:  -Group instruction provided by verbal instruction, video, and written materials to support subject.  Instructors address signs and symptoms of angina and heart attacks.    Also  discuss risk factors for heart disease and how to make changes to improve heart health risk factors.   Functional Fitness:  -Group instruction provided by verbal instruction, demonstration, patient participation, and written materials to support subject.  Instructors address safety measures for doing things around the house.  Discuss how to get up and down off the floor, how to pick things up properly, how to safely get out of a chair without assistance, and balance training.   Meditation and Mindfulness:  -Group instruction provided by verbal instruction, patient participation, and written materials to support subject.  Instructor addresses importance of mindfulness and meditation practice to help reduce stress and improve awareness.  Instructor also leads participants through a meditation exercise.    Stretching for Flexibility and Mobility:  -Group instruction provided by verbal instruction, patient participation, and written materials to support subject.  Instructors lead participants through series of stretches that are designed to increase flexibility thus improving mobility.  These stretches are additional exercise for major muscle groups that are typically performed during regular warm up and cool down.   Hands Only CPR:  -Group verbal, video, and participation provides a basic overview of AHA guidelines for community CPR. Role-play of emergencies allow participants the opportunity to practice calling for help and chest compression technique with discussion of AED use.   Hypertension: -Group verbal and written instruction that provides a basic overview of hypertension including the most recent diagnostic guidelines, risk factor reduction with self-care instructions and medication management.    Nutrition I class: Heart Healthy Eating:  -Group instruction  provided by PowerPoint slides, verbal discussion, and written materials to support subject matter. The instructor gives an  explanation and review of the Therapeutic Lifestyle Changes diet recommendations, which includes a discussion on lipid goals, dietary fat, sodium, fiber, plant stanol/sterol esters, sugar, and the components of a well-balanced, healthy diet.   Nutrition II class: Lifestyle Skills:  -Group instruction provided by PowerPoint slides, verbal discussion, and written materials to support subject matter. The instructor gives an explanation and review of label reading, grocery shopping for heart health, heart healthy recipe modifications, and ways to make healthier choices when eating out.   Diabetes Question & Answer:  -Group instruction provided by PowerPoint slides, verbal discussion, and written materials to support subject matter. The instructor gives an explanation and review of diabetes co-morbidities, pre- and post-prandial blood glucose goals, pre-exercise blood glucose goals, signs, symptoms, and treatment of hypoglycemia and hyperglycemia, and foot care basics.   Diabetes Blitz:  -Group instruction provided by PowerPoint slides, verbal discussion, and written materials to support subject matter. The instructor gives an explanation and review of the physiology behind type 1 and type 2 diabetes, diabetes medications and rational behind using different medications, pre- and post-prandial blood glucose recommendations and Hemoglobin A1c goals, diabetes diet, and exercise including blood glucose guidelines for exercising safely.    Portion Distortion:  -Group instruction provided by PowerPoint slides, verbal discussion, written materials, and food models to support subject matter. The instructor gives an explanation of serving size versus portion size, changes in portions sizes over the last 20 years, and what consists of a serving from each food group.   Stress Management:  -Group instruction provided by verbal instruction, video, and written materials to support subject matter.  Instructors  review role of stress in heart disease and how to cope with stress positively.     Exercising on Your Own:  -Group instruction provided by verbal instruction, power point, and written materials to support subject.  Instructors discuss benefits of exercise, components of exercise, frequency and intensity of exercise, and end points for exercise.  Also discuss use of nitroglycerin and activating EMS.  Review options of places to exercise outside of rehab.  Review guidelines for sex with heart disease.   Cardiac Drugs I:  -Group instruction provided by verbal instruction and written materials to support subject.  Instructor reviews cardiac drug classes: antiplatelets, anticoagulants, beta blockers, and statins.  Instructor discusses reasons, side effects, and lifestyle considerations for each drug class.   Cardiac Drugs II:  -Group instruction provided by verbal instruction and written materials to support subject.  Instructor reviews cardiac drug classes: angiotensin converting enzyme inhibitors (ACE-I), angiotensin II receptor blockers (ARBs), nitrates, and calcium channel blockers.  Instructor discusses reasons, side effects, and lifestyle considerations for each drug class.   Anatomy and Physiology of the Circulatory System:  Group verbal and written instruction and models provide basic cardiac anatomy and physiology, with the coronary electrical and arterial systems. Review of: AMI, Angina, Valve disease, Heart Failure, Peripheral Artery Disease, Cardiac Arrhythmia, Pacemakers, and the ICD.   Other Education:  -Group or individual verbal, written, or video instructions that support the educational goals of the cardiac rehab program.   Holiday Eating Survival Tips:  -Group instruction provided by PowerPoint slides, verbal discussion, and written materials to support subject matter. The instructor gives patients tips, tricks, and techniques to help them not only survive but enjoy the holidays  despite the onslaught of food that accompanies the holidays.   Knowledge Questionnaire Score:  Knowledge Questionnaire Score - 07/18/21 1020       Knowledge Questionnaire Score   Pre Score 19/24             Core Components/Risk Factors/Patient Goals at Admission:  Personal Goals and Risk Factors at Admission - 07/18/21 1020       Core Components/Risk Factors/Patient Goals on Admission    Weight Management Yes;Obesity;Weight Loss    Intervention Weight Management: Develop a combined nutrition and exercise program designed to reach desired caloric intake, while maintaining appropriate intake of nutrient and fiber, sodium and fats, and appropriate energy expenditure required for the weight goal.;Weight Management: Provide education and appropriate resources to help participant work on and attain dietary goals.;Weight Management/Obesity: Establish reasonable short term and long term weight goals.;Obesity: Provide education and appropriate resources to help participant work on and attain dietary goals.    Admit Weight 251 lb 5.2 oz (114 kg)    Expected Outcomes Understanding of distribution of calorie intake throughout the day with the consumption of 4-5 meals/snacks;Understanding recommendations for meals to include 15-35% energy as protein, 25-35% energy from fat, 35-60% energy from carbohydrates, less than '200mg'$  of dietary cholesterol, 20-35 gm of total fiber daily;Weight Loss: Understanding of general recommendations for a balanced deficit meal plan, which promotes 1-2 lb weight loss per week and includes a negative energy balance of [phone] kcal/d;Weight Maintenance: Understanding of the daily nutrition guidelines, which includes 25-35% calories from fat, 7% or less cal from saturated fats, less than '200mg'$  cholesterol, less than 1.5gm of sodium, & 5 or more servings of fruits and vegetables daily;Long Term: Adherence to nutrition and physical activity/exercise program aimed toward attainment  of established weight goal;Short Term: Continue to assess and modify interventions until short term weight is achieved    Hypertension Yes    Intervention Provide education on lifestyle modifcations including regular physical activity/exercise, weight management, moderate sodium restriction and increased consumption of fresh fruit, vegetables, and low fat dairy, alcohol moderation, and smoking cessation.;Monitor prescription use compliance.    Expected Outcomes Short Term: Continued assessment and intervention until BP is < 140/40m HG in hypertensive participants. < 130/830mHG in hypertensive participants with diabetes, heart failure or chronic kidney disease.;Long Term: Maintenance of blood pressure at goal levels.    Lipids Yes    Intervention Provide education and support for participant on nutrition & aerobic/resistive exercise along with prescribed medications to achieve LDL '70mg'$ , HDL >'40mg'$ .    Expected Outcomes Short Term: Participant states understanding of desired cholesterol values and is compliant with medications prescribed. Participant is following exercise prescription and nutrition guidelines.;Long Term: Cholesterol controlled with medications as prescribed, with individualized exercise RX and with personalized nutrition plan. Value goals: LDL < '70mg'$ , HDL > 40 mg.    Stress Yes    Intervention Offer individual and/or small group education and counseling on adjustment to heart disease, stress management and health-related lifestyle change. Teach and support self-help strategies.;Refer participants experiencing significant psychosocial distress to appropriate mental health specialists for further evaluation and treatment. When possible, include family members and significant others in education/counseling sessions.    Expected Outcomes Short Term: Participant demonstrates changes in health-related behavior, relaxation and other stress management skills, ability to obtain effective social  support, and compliance with psychotropic medications if prescribed.;Long Term: Emotional wellbeing is indicated by absence of clinically significant psychosocial distress or social isolation.             Core Components/Risk Factors/Patient Goals Review:   Goals and Risk Factor Review  [place] Name 08/08/21 0720             Core Components/Risk Factors/Patient Goals Review   Personal Goals Review Weight Management/Obesity;Hypertension;Lipids;Stress       Review Touger has been doing well with exercise at cardiac rehab. Shaft recently received steroid injections in his back and knees. Vital signs have been stable.       Expected Outcomes Rexx will continue to partcipate in phase 2 cardiac rehab for exercise, nutrition and lifestyle modifications                Core Components/Risk Factors/Patient Goals at Discharge (Final Review):   Goals and Risk Factor Review - 08/08/21 0720       Core Components/Risk Factors/Patient Goals Review   Personal Goals Review Weight Management/Obesity;Hypertension;Lipids;Stress    Review Dashton has been doing well with exercise at cardiac rehab. Aaronjames recently received steroid injections in his back and knees. Vital signs have been stable.    Expected Outcomes Spenser will continue to partcipate in phase 2 cardiac rehab for exercise, nutrition and lifestyle modifications             ITP Comments:  ITP Comments     Row Name 07/18/21 0927 08/08/21 0715         ITP Comments Dr Fransico Him MD, Medical Director 30 Day ITP Review. Bobbis has good participation and attendance for his fitness level. Mortimer Fries is off to a good start to exercise despite his orthopedic limitations               Comments: See ITP Comments

## 2021-08-09 ENCOUNTER — Other Ambulatory Visit: Payer: Self-pay

## 2021-08-09 ENCOUNTER — Ambulatory Visit (HOSPITAL_COMMUNITY): Payer: Medicare Other

## 2021-08-09 ENCOUNTER — Encounter (HOSPITAL_COMMUNITY)
Admission: RE | Admit: 2021-08-09 | Discharge: 2021-08-09 | Disposition: A | Discharge status: Home / Self Care | Payer: Medicare Other | Source: Ambulatory Visit | Attending: Cardiology | Admitting: Cardiology

## 2021-08-09 DIAGNOSIS — Z955 Presence of coronary angioplasty implant and graft: Secondary | ICD-10-CM | POA: Diagnosis not present

## 2021-08-09 NOTE — Progress Notes (Signed)
Reviewed home exercise Rx today with patient. Encouraged warm-up, cool-down and stretching. Reviewed THR of 57-114 and keeping RPE between 11-13. Pt will do a combination of walking and marching from a chair due to knee issues. Reviewed weather parameters for temperature and humidity for safe exercise outdoors. Reviewed S/S that require pat to terminate exercise and when to 911 vs. MD. Pt verbalized understanding of the home exercise Rx and was provided a copy.   Lesly Rubenstein MS, ACSM-CEP, CCRP

## 2021-08-11 ENCOUNTER — Other Ambulatory Visit: Payer: Self-pay

## 2021-08-11 ENCOUNTER — Ambulatory Visit (HOSPITAL_COMMUNITY): Payer: Medicare Other

## 2021-08-11 ENCOUNTER — Encounter (HOSPITAL_COMMUNITY)
Admission: RE | Admit: 2021-08-11 | Discharge: 2021-08-11 | Disposition: A | Discharge status: Home / Self Care | Payer: Medicare Other | Source: Ambulatory Visit | Attending: Cardiology | Admitting: Cardiology

## 2021-08-11 DIAGNOSIS — Z955 Presence of coronary angioplasty implant and graft: Secondary | ICD-10-CM | POA: Diagnosis not present

## 2021-08-14 ENCOUNTER — Other Ambulatory Visit: Payer: Self-pay

## 2021-08-14 ENCOUNTER — Ambulatory Visit (HOSPITAL_COMMUNITY): Payer: Medicare Other

## 2021-08-14 ENCOUNTER — Encounter (HOSPITAL_COMMUNITY)
Admission: RE | Admit: 2021-08-14 | Discharge: 2021-08-14 | Disposition: A | Discharge status: Home / Self Care | Payer: Medicare Other | Source: Ambulatory Visit | Attending: Cardiology | Admitting: Cardiology

## 2021-08-14 DIAGNOSIS — Z955 Presence of coronary angioplasty implant and graft: Secondary | ICD-10-CM | POA: Diagnosis not present

## 2021-08-16 ENCOUNTER — Ambulatory Visit (HOSPITAL_COMMUNITY): Payer: Medicare Other

## 2021-08-16 ENCOUNTER — Encounter (HOSPITAL_COMMUNITY)
Admission: RE | Admit: 2021-08-16 | Discharge: 2021-08-16 | Disposition: A | Discharge status: Home / Self Care | Payer: Medicare Other | Source: Ambulatory Visit | Attending: Cardiology | Admitting: Cardiology

## 2021-08-16 ENCOUNTER — Other Ambulatory Visit: Payer: Self-pay

## 2021-08-16 DIAGNOSIS — Z955 Presence of coronary angioplasty implant and graft: Secondary | ICD-10-CM

## 2021-08-18 ENCOUNTER — Ambulatory Visit (HOSPITAL_COMMUNITY): Payer: Medicare Other

## 2021-08-18 ENCOUNTER — Other Ambulatory Visit: Payer: Self-pay

## 2021-08-18 ENCOUNTER — Encounter (HOSPITAL_COMMUNITY)
Admission: RE | Admit: 2021-08-18 | Discharge: 2021-08-18 | Disposition: A | Discharge status: Home / Self Care | Payer: Medicare Other | Source: Ambulatory Visit | Attending: Cardiology | Admitting: Cardiology

## 2021-08-18 DIAGNOSIS — Z955 Presence of coronary angioplasty implant and graft: Secondary | ICD-10-CM

## 2021-08-21 ENCOUNTER — Ambulatory Visit (HOSPITAL_COMMUNITY): Payer: Medicare Other

## 2021-08-21 ENCOUNTER — Encounter (HOSPITAL_COMMUNITY)
Admission: RE | Admit: 2021-08-21 | Discharge: 2021-08-21 | Disposition: A | Discharge status: Home / Self Care | Payer: Medicare Other | Source: Ambulatory Visit | Attending: Cardiology | Admitting: Cardiology

## 2021-08-21 ENCOUNTER — Other Ambulatory Visit: Payer: Self-pay

## 2021-08-21 DIAGNOSIS — Z955 Presence of coronary angioplasty implant and graft: Secondary | ICD-10-CM | POA: Diagnosis not present

## 2021-08-23 ENCOUNTER — Other Ambulatory Visit: Payer: Self-pay

## 2021-08-23 ENCOUNTER — Ambulatory Visit (HOSPITAL_COMMUNITY): Payer: Medicare Other

## 2021-08-23 ENCOUNTER — Encounter (HOSPITAL_COMMUNITY)
Admission: RE | Admit: 2021-08-23 | Discharge: 2021-08-23 | Disposition: A | Discharge status: Home / Self Care | Payer: Medicare Other | Source: Ambulatory Visit | Attending: Cardiology | Admitting: Cardiology

## 2021-08-23 DIAGNOSIS — Z955 Presence of coronary angioplasty implant and graft: Secondary | ICD-10-CM | POA: Diagnosis not present

## 2021-08-25 ENCOUNTER — Ambulatory Visit (HOSPITAL_COMMUNITY): Payer: Medicare Other

## 2021-08-25 ENCOUNTER — Other Ambulatory Visit: Payer: Self-pay

## 2021-08-25 ENCOUNTER — Encounter (HOSPITAL_COMMUNITY)
Admission: RE | Admit: 2021-08-25 | Discharge: 2021-08-25 | Disposition: A | Discharge status: Home / Self Care | Payer: Medicare Other | Source: Ambulatory Visit | Attending: Cardiology | Admitting: Cardiology

## 2021-08-25 DIAGNOSIS — Z955 Presence of coronary angioplasty implant and graft: Secondary | ICD-10-CM | POA: Diagnosis not present

## 2021-08-30 ENCOUNTER — Other Ambulatory Visit: Payer: Self-pay

## 2021-08-30 ENCOUNTER — Encounter (HOSPITAL_COMMUNITY)
Admission: RE | Admit: 2021-08-30 | Discharge: 2021-08-30 | Disposition: A | Discharge status: Home / Self Care | Payer: Medicare Other | Source: Ambulatory Visit | Attending: Cardiology | Admitting: Cardiology

## 2021-08-30 ENCOUNTER — Ambulatory Visit (HOSPITAL_COMMUNITY): Payer: Medicare Other

## 2021-08-30 DIAGNOSIS — Z955 Presence of coronary angioplasty implant and graft: Secondary | ICD-10-CM | POA: Diagnosis not present

## 2021-09-01 ENCOUNTER — Encounter (HOSPITAL_COMMUNITY)
Admission: RE | Admit: 2021-09-01 | Discharge: 2021-09-01 | Disposition: A | Discharge status: Home / Self Care | Payer: Medicare Other | Source: Ambulatory Visit | Attending: Cardiology | Admitting: Cardiology

## 2021-09-01 ENCOUNTER — Other Ambulatory Visit: Payer: Self-pay

## 2021-09-01 ENCOUNTER — Encounter: Payer: Self-pay | Admitting: Cardiology

## 2021-09-01 ENCOUNTER — Ambulatory Visit (HOSPITAL_COMMUNITY): Payer: Medicare Other

## 2021-09-01 DIAGNOSIS — Z955 Presence of coronary angioplasty implant and graft: Secondary | ICD-10-CM

## 2021-09-04 ENCOUNTER — Encounter (HOSPITAL_COMMUNITY)
Admission: RE | Admit: 2021-09-04 | Discharge: 2021-09-04 | Disposition: A | Discharge status: Home / Self Care | Payer: Medicare Other | Source: Ambulatory Visit | Attending: Cardiology | Admitting: Cardiology

## 2021-09-04 ENCOUNTER — Ambulatory Visit (HOSPITAL_COMMUNITY): Payer: Medicare Other

## 2021-09-04 ENCOUNTER — Other Ambulatory Visit: Payer: Self-pay

## 2021-09-04 DIAGNOSIS — Z955 Presence of coronary angioplasty implant and graft: Secondary | ICD-10-CM

## 2021-09-05 NOTE — Progress Notes (Signed)
Cardiac Individual Treatment Plan  Patient Details  Name: Martin Stephenson MRN: [id number] Date of Birth: 03/26/1941 Referring Provider:   Flowsheet Row CARDIAC REHAB PHASE II ORIENTATION from 07/18/2021 in [facility]  Referring Provider Jay Ganji, MD       Initial Encounter Date:  Flowsheet Row CARDIAC REHAB PHASE II ORIENTATION from 07/18/2021 in [facility] C[facility]  Date 07/18/21       Visit Diagnosis: 03/14/21 S/P DES CFX/LM  Patient's Home Medications on Admission:  Current Outpatient Medications:    allopurinol (ZYLOPRIM) 300 MG tablet, Take 300 mg by mouth daily. , Disp: , Rfl:    amLODipine (NORVASC) 5 MG tablet, TAKE 1 TABLET BY MOUTH DAILY., Disp: 90 tablet, Rfl: 3   aspirin 81 MG tablet, Take 81 mg by mouth daily., Disp: , Rfl:    Calcium Carb-Cholecalciferol (CALCIUM 600/VITAMIN D3 PO), Take 1 tablet by mouth daily. , Disp: , Rfl:    clopidogrel (PLAVIX) 75 MG tablet, Take 1 tablet (75 mg total) by mouth daily., Disp: 90 tablet, Rfl: 3   ferrous sulfate 325 (65 FE) MG tablet, Take 325 mg by mouth every Monday, Wednesday, and Friday., Disp: , Rfl:    furosemide (LASIX) 40 MG tablet, Take 40 mg by mouth as needed., Disp: , Rfl:    losartan (COZAAR) 25 MG tablet, TAKE 1 TABLET BY MOUTH EVERY DAY IN THE EVENING, Disp: 90 tablet, Rfl: 1   losartan (COZAAR) 50 MG tablet, Take 1 tablet (50 mg total) by mouth every evening., Disp: 90 tablet, Rfl: 3   Nebivolol HCl (BYSTOLIC) 20 MG TABS, Take 1 tablet (20 mg total) by mouth daily. (Patient not taking: Reported on 07/18/2021), Disp: 90 tablet, Rfl: 3   rosuvastatin (CRESTOR) 10 MG tablet, Take 10 mg by mouth every Monday, Wednesday, and Friday., Disp: , Rfl:   Past Medical History: Past Medical History:  Diagnosis Date   Allergy    Anemia    Arthritis    foot   Congestive heart failure (HCC)    Constipation    uses mag citrate OTC if needed   GERD (gastroesophageal reflux  disease)    Hiatal hernia    Hyperlipidemia    Hypertension    Kidney stone    Sleep apnea    wears cpap    Tobacco Use: Social History   Tobacco Use  Smoking Status Never  Smokeless Tobacco Never    Labs: Recent Review Flowsheet Data     Labs for ITP Cardiac and Pulmonary Rehab Latest Ref Rng & Units 03/14/2021 03/14/2021 03/14/2021   PHART 7.350 - 7.450 - - 7.361   PCO2ART 32.0 - 48.0 mmHg - - 44.1   HCO3 20.0 - 28.0 mmol/L 25.9 26.5 24.9   TCO2 22 - 32 mmol/L 27 28 26   ACIDBASEDEF 0.0 - 2.0 mmol/L 1.0 - 1.0   O2SAT % 74.0 73.0 99.0       Capillary Blood Glucose: No results found for: GLUCAP   Exercise Target Goals: Exercise Program Goal: Individual exercise prescription set using results from initial 6 min walk test and THRR while considering  patient's activity barriers and safety.   Exercise Prescription Goal: Starting with aerobic activity 30 plus minutes a day, 3 days per week for initial exercise prescription. Provide home exercise prescription and guidelines that participant acknowledges understanding prior to discharge.  Activity Barriers & Risk Stratification:  Activity Barriers & Cardiac Risk Stratification - 07/18/21 1026         Activity Barriers & Cardiac Risk Stratification   Activity Barriers Arthritis;Back Problems;Neck/Spine Problems;Joint Problems;Deconditioning;Shortness of Breath;Balance Concerns;Other (comment)    Comments Uses a cane to walk, but only when knees become too painful.    Cardiac Risk Stratification High             6 Minute Walk:  6 Minute Walk     Row Name 07/18/21 0935         6 Minute Walk   Phase Initial     Distance 1000 feet     Walk Time 6 minutes     # of Rest Breaks 1  5:35-6:00     MPH 1.89     METS 1.71     RPE 11     Perceived Dyspnea  1     VO2 Peak 5.97     Symptoms Yes (comment)     Comments Low back tightness nad right knee tightness; states it is not pain. SOB with RPD = 1     Resting HR 73  bpm     Resting BP 108/74     Resting Oxygen Saturation  100 %     Exercise Oxygen Saturation  during 6 min walk 99 %     Max Ex. HR 81 bpm     Max Ex. BP 150/70     2 Minute Post BP 134/72              Oxygen Initial Assessment:   Oxygen Re-Evaluation:   Oxygen Discharge (Final Oxygen Re-Evaluation):   Initial Exercise Prescription:  Initial Exercise Prescription - 07/18/21 1000       Date of Initial Exercise RX and Referring Provider   Date 07/18/21    Referring Provider Jay Ganji, MD    Expected Discharge Date 09/15/21      NuStep   Level 2    SPM 75    Minutes 25    METs 1.8      Prescription Details   Frequency (times per week) 3    Duration Progress to 30 minutes of continuous aerobic without signs/symptoms of physical distress      Intensity   THRR 40-80% of Max Heartrate 57-114    Ratings of Perceived Exertion 11-13    Perceived Dyspnea 0-4      Progression   Progression Continue progressive overload as per policy without signs/symptoms or physical distress.      Resistance Training   Training Prescription Yes    Weight 3 lbs    Reps 10-15             Perform Capillary Blood Glucose checks as needed.  Exercise Prescription Changes:   Exercise Prescription Changes     Row Name 07/24/21 1000 08/04/21 1500 08/09/21 1000 08/23/21 1037 09/01/21 1000     Response to Exercise   Blood Pressure (Admit) 130/66 148/62 140/78 130/60 138/62   Blood Pressure (Exercise) 140/70 140/70 148/70 156/80 160/78   Blood Pressure (Exit) 130/70 122/78 124/70 128/78 142/70   Heart Rate (Admit) 83 bpm 65 bpm 79 bpm 60 bpm 74 bpm   Heart Rate (Exercise) 78 bpm 90 bpm 93 bpm 107 bpm 99 bpm   Heart Rate (Exit) 60 bpm 56 bpm 70 bpm 70 bpm 74 bpm   Rating of Perceived Exertion (Exercise) 9 11 11 13 12   Symptoms None None None None None   Comments Pt's first day in the CRP2 program Reviewed METs Reviewed Home exercise Rx Reviewed METs and   Goals Reviewed METs    Duration Progress to 30 minutes of  aerobic without signs/symptoms of physical distress Continue with 30 min of aerobic exercise without signs/symptoms of physical distress. Continue with 30 min of aerobic exercise without signs/symptoms of physical distress. Continue with 30 min of aerobic exercise without signs/symptoms of physical distress. Continue with 30 min of aerobic exercise without signs/symptoms of physical distress.   Intensity THRR unchanged THRR unchanged THRR unchanged THRR unchanged THRR unchanged     Progression   Progression Continue to progress workloads to maintain intensity without signs/symptoms of physical distress. Continue to progress workloads to maintain intensity without signs/symptoms of physical distress. Continue to progress workloads to maintain intensity without signs/symptoms of physical distress. Continue to progress workloads to maintain intensity without signs/symptoms of physical distress. Continue to progress workloads to maintain intensity without signs/symptoms of physical distress.   Average METs 1.9 2.5 2.5 2.8 2.7     Resistance Training   Training Prescription Yes Yes No No Yes   Weight 3 lbs 3 lbs No weights on wednesday No weights on wednesday 3 lbs   Reps 10-15 10-15 -- -- 10-15   Time 10 Minutes 10 Minutes -- -- 10 Minutes     Interval Training   Interval Training No No No No No     NuStep   Level 2 3 2 3 3   SPM 75 85 85 90 90   Minutes 25 30 30 30 30   METs 1.9 2.5 2.5 2.8 2.7     Home Exercise Plan   Plans to continue exercise at -- -- Home (comment) Home (comment) Home (comment)   Frequency -- -- Add 2 additional days to program exercise sessions. Add 2 additional days to program exercise sessions. Add 2 additional days to program exercise sessions.   Initial Home Exercises Provided -- -- 08/02/21 08/02/21 08/02/21            Exercise Comments:   Exercise Comments     Row Name 07/24/21 1029 08/04/21 1533 08/09/21 1024 08/23/21  0945 09/01/21 1000   Exercise Comments Pt's first day in the CRP2 program, No complaints with todays session and off to a good start. Reviewed METs. Pt is making good progress in the CRP2 program. Revfewed home exercise Rx. Pt verbalized understanding of the home exercise Rx and was provided a copy. Reviewed METs and goals. Pt is progressing toward goals. Reviewed  METs. Pt is currently at a MET plateau. Pt continues to put forth good effort in the CRP2 program. Pt is concerned about his knees, as pt has h/o problems and had recent injections. Will continue to encouraged patient to progress as he feels able.            Exercise Goals and Review:   Exercise Goals     Row Name 07/18/21 1027             Exercise Goals   Increase Physical Activity Yes       Intervention Provide advice, education, support and counseling about physical activity/exercise needs.;Develop an individualized exercise prescription for aerobic and resistive training based on initial evaluation findings, risk stratification, comorbidities and participant's personal goals.       Expected Outcomes Short Term: Attend rehab on a regular basis to increase amount of physical activity.;Long Term: Add in home exercise to make exercise part of routine and to increase amount of physical activity.;Long Term: Exercising regularly at least 3-5 days a week.         Increase Strength and Stamina Yes       Intervention Provide advice, education, support and counseling about physical activity/exercise needs.;Develop an individualized exercise prescription for aerobic and resistive training based on initial evaluation findings, risk stratification, comorbidities and participant's personal goals.       Expected Outcomes Short Term: Increase workloads from initial exercise prescription for resistance, speed, and METs.;Short Term: Perform resistance training exercises routinely during rehab and add in resistance training at home;Long Term:  Improve cardiorespiratory fitness, muscular endurance and strength as measured by increased METs and functional capacity (6MWT)       Able to understand and use rate of perceived exertion (RPE) scale Yes       Intervention Provide education and explanation on how to use RPE scale       Expected Outcomes Short Term: Able to use RPE daily in rehab to express subjective intensity level;Long Term:  Able to use RPE to guide intensity level when exercising independently       Knowledge and understanding of Target Heart Rate Range (THRR) Yes       Intervention Provide education and explanation of THRR including how the numbers were predicted and where they are located for reference       Expected Outcomes Short Term: Able to state/look up THRR;Short Term: Able to use daily as guideline for intensity in rehab;Long Term: Able to use THRR to govern intensity when exercising independently       Understanding of Exercise Prescription Yes       Intervention Provide education, explanation, and written materials on patient's individual exercise prescription       Expected Outcomes Short Term: Able to explain program exercise prescription;Long Term: Able to explain home exercise prescription to exercise independently                Exercise Goals Re-Evaluation :  Exercise Goals Re-Evaluation     [place] Name 07/24/21 1022 08/09/21 1022 08/23/21 0945 08/24/21 0840       Exercise Goal Re-Evaluation   Exercise Goals Review Increase Physical Activity;Increase Strength and Stamina;Able to understand and use rate of perceived exertion (RPE) scale;Knowledge and understanding of Target Heart Rate Range (THRR);Understanding of Exercise Prescription Increase Physical Activity;Increase Strength and Stamina;Able to understand and use rate of perceived exertion (RPE) scale;Knowledge and understanding of Target Heart Rate Range (THRR);Understanding of Exercise Prescription Increase Physical Activity;Increase Strength and  Stamina;Able to understand and use rate of perceived exertion (RPE) scale;Knowledge and understanding of Target Heart Rate Range (THRR);Able to check pulse independently;Understanding of Exercise Prescription --    Comments Pt's first day in the CRP2 program. Pt understands the exercise Rx, THRR, and RPE scale. Reviewed home exercise Rx. Pt has issues with he knees so he will do a combination of walking and marching from a seat position, Goal is 30 minutes 2x/week in addtion to exercise 3x/week in the CRP2 program. Reviewed METs and goals. Pt vocies that he is making progress on his goal to increase his stamina. He voices being able to increase to level 3 on the nustep is evidence of that. Feels positive about his progress. Long term goal to get back to walking. Pt still trying to include wlaks in his schedule. --    Expected Outcomes WIll continue to monitor and progress exercise workloads as tolerated. Pt will exercise at home 2x/week for a total of 30 minutes. Will continue to monitor patient and progress exercise workloads as tolerated. --  Discharge Exercise Prescription (Final Exercise Prescription Changes):  Exercise Prescription Changes - 09/01/21 1000       Response to Exercise   Blood Pressure (Admit) 138/62    Blood Pressure (Exercise) 160/78    Blood Pressure (Exit) 142/70    Heart Rate (Admit) 74 bpm    Heart Rate (Exercise) 99 bpm    Heart Rate (Exit) 74 bpm    Rating of Perceived Exertion (Exercise) 12    Symptoms None    Comments Reviewed METs    Duration Continue with 30 min of aerobic exercise without signs/symptoms of physical distress.    Intensity THRR unchanged      Progression   Progression Continue to progress workloads to maintain intensity without signs/symptoms of physical distress.    Average METs 2.7      Resistance Training   Training Prescription Yes    Weight 3 lbs    Reps 10-15    Time 10 Minutes      Interval Training   Interval  Training No      NuStep   Level 3    SPM 90    Minutes 30    METs 2.7      Home Exercise Plan   Plans to continue exercise at Home (comment)    Frequency Add 2 additional days to program exercise sessions.    Initial Home Exercises Provided 08/02/21             Nutrition:  Target Goals: Understanding of nutrition guidelines, daily intake of sodium <1536m, cholesterol <2076m calories 30% from fat and 7% or less from saturated fats, daily to have 5 or more servings of fruits and vegetables.  Biometrics:  Pre Biometrics - 07/18/21 0900       Pre Biometrics   Waist Circumference 46 inches    Hip Circumference 49 inches    Waist to Hip Ratio 0.94 %    Triceps Skinfold 16 mm    % Body Fat 31.6 %    Grip Strength 47 kg    Flexibility 13.5 in    Single Leg Stand 3.93 seconds   High fal risk             Nutrition Therapy Plan and Nutrition Goals:  Nutrition Therapy & Goals - 07/28/21 1005       Nutrition Therapy   Diet TLC    Drug/Food Interactions Statins/Certain Fruits      Personal Nutrition Goals   Nutrition Goal Pt to build a healthy plate including vegetables, fruits, whole grains, and low-fat dairy products in a heart healthy meal plan.    Personal Goal #2 Pt to reduce processed and red meat only 2 times per week    Personal Goal #3 Pt to reduce salt shaker use by tasting food and shaking salt into his palm      Intervention Plan   Intervention Prescribe, educate and counsel regarding individualized specific dietary modifications aiming towards targeted core components such as weight, hypertension, lipid management, diabetes, heart failure and other comorbidities.;Nutrition handout(s) given to patient.    Expected Outcomes Short Term Goal: A plan has been developed with personal nutrition goals set during dietitian appointment.;Long Term Goal: Adherence to prescribed nutrition plan.             Nutrition Assessments:  MEDIFICTS Score Key: ?70 Need  to make dietary changes  40-70 Heart Healthy Diet ? 40 Therapeutic Level Cholesterol Diet  Flowsheet Row CARDIAC REHAB PHASE II EXERCISE from 07/28/2021 in  [place]  Picture Your Plate Total Score on Admission 44      Picture Your Plate Scores: <78 Unhealthy dietary pattern with much room for improvement. 41-50 Dietary pattern unlikely to meet recommendations for good health and room for improvement. 51-60 More healthful dietary pattern, with some room for improvement.  >60 Healthy dietary pattern, although there may be some specific behaviors that could be improved.    Nutrition Goals Re-Evaluation:  Nutrition Goals Re-Evaluation     Row Name 07/28/21 1006 08/03/21 0853 09/04/21 0940         Goals   Current Weight 251 lb (113.9 kg) 254 lb 3.1 oz (115.3 kg) 245 lb 9.5 oz (111.4 kg)     Nutrition Goal Pt to build a healthy plate including vegetables, fruits, whole grains, and low-fat dairy products in a heart healthy meal plan. Pt to build a healthy plate including vegetables, fruits, whole grains, and low-fat dairy products in a heart healthy meal plan. Pt to build a healthy plate including vegetables, fruits, whole grains, and low-fat dairy products in a heart healthy meal plan.     Comment -- -- Pt has lost 6 lbs.           Personal Goal #2 Re-Evaluation   Personal Goal #2 Pt to reduce processed and red meat only 2 times per week Pt to reduce processed and red meat only 2 times per week Pt to reduce processed and red meat only 2 times per week           Personal Goal #3 Re-Evaluation   Personal Goal #3 Pt to reduce salt shaker use by tasting food and shaking salt into his palm Pt to reduce salt shaker use by tasting food and shaking salt into his palm Pt to reduce salt shaker use by tasting food and shaking salt into his palm              Nutrition Goals Discharge (Final Nutrition Goals Re-Evaluation):  Nutrition Goals Re-Evaluation -  09/04/21 0940       Goals   Current Weight 245 lb 9.5 oz (111.4 kg)    Nutrition Goal Pt to build a healthy plate including vegetables, fruits, whole grains, and low-fat dairy products in a heart healthy meal plan.    Comment Pt has lost 6 lbs.      Personal Goal #2 Re-Evaluation   Personal Goal #2 Pt to reduce processed and red meat only 2 times per week      Personal Goal #3 Re-Evaluation   Personal Goal #3 Pt to reduce salt shaker use by tasting food and shaking salt into his palm             Psychosocial: Target Goals: Acknowledge presence or absence of significant depression and/or stress, maximize coping skills, provide positive support system. Participant is able to verbalize types and ability to use techniques and skills needed for reducing stress and depression.  Initial Review & Psychosocial Screening:  Initial Psych Review & Screening - 07/18/21 1129       Initial Review   Current issues with None Identified      Family Dynamics   Good Support System? Yes   Mr Petsch has his wife and children for support     Barriers   Psychosocial barriers to participate in program There are no identifiable barriers or psychosocial needs.      Screening Interventions   Interventions Encouraged to exercise  Quality of Life Scores:  Quality of Life - 07/18/21 1019       Quality of Life   Select Quality of Life      Quality of Life Scores   Health/Function Pre 20.93 %    Socioeconomic Pre 24.43 %    Psych/Spiritual Pre 25.71 %    Family Pre 23.5 %    GLOBAL Pre 23.01 %            Scores of 19 and below usually indicate a poorer quality of life in these areas.  A difference of  2-3 points is a clinically meaningful difference.  A difference of 2-3 points in the total score of the Quality of Life Index has been associated with significant improvement in overall quality of life, self-image, physical symptoms, and general health in studies assessing  change in quality of life.  PHQ-9: Recent Review Flowsheet Data     Depression screen PHQ 2/9 07/18/2021   Decreased Interest 0   Down, Depressed, Hopeless 0   PHQ - 2 Score 0      Interpretation of Total Score  Total Score Depression Severity:  1-4 = Minimal depression, 5-9 = Mild depression, 10-14 = Moderate depression, 15-19 = Moderately severe depression, 20-27 = Severe depression   Psychosocial Evaluation and Intervention:   Psychosocial Re-Evaluation:  Psychosocial Re-Evaluation     Row Name 08/08/21 0717 09/05/21 1314           Psychosocial Re-Evaluation   Current issues with None Identified None Identified      Interventions Encouraged to attend Cardiac Rehabilitation for the exercise Encouraged to attend Cardiac Rehabilitation for the exercise      Continue Psychosocial Services  No Follow up required No Follow up required               Psychosocial Discharge (Final Psychosocial Re-Evaluation):  Psychosocial Re-Evaluation - 09/05/21 1314       Psychosocial Re-Evaluation   Current issues with None Identified    Interventions Encouraged to attend Cardiac Rehabilitation for the exercise    Continue Psychosocial Services  No Follow up required             Vocational Rehabilitation: Provide vocational rehab assistance to qualifying candidates.   Vocational Rehab Evaluation & Intervention:  Vocational Rehab - 07/18/21 1130       Initial Vocational Rehab Evaluation & Intervention   Assessment shows need for Vocational Rehabilitation No   Manu is semi retired and does not need vocational rehab at this time.            Education: Education Goals: Education classes will be provided on a weekly basis, covering required topics. Participant will state understanding/return demonstration of topics presented.  Learning Barriers/Preferences:  Learning Barriers/Preferences - 07/18/21 1021       Learning Barriers/Preferences   Learning Preferences  Computer/Internet;Pictoral;Video             Education Topics: Hypertension, Hypertension Reduction -Define heart disease and high blood pressure. Discus how high blood pressure affects the body and ways to reduce high blood pressure.   Exercise and Your Heart -Discuss why it is important to exercise, the FITT principles of exercise, normal and abnormal responses to exercise, and how to exercise safely.   Angina -Discuss definition of angina, causes of angina, treatment of angina, and how to decrease risk of having angina.   Cardiac Medications -Review what the following cardiac medications are used for, how they affect the body, and side   effects that may occur when taking the medications.  Medications include Aspirin, Beta blockers, calcium channel blockers, ACE Inhibitors, angiotensin receptor blockers, diuretics, digoxin, and antihyperlipidemics.   Congestive Heart Failure -Discuss the definition of CHF, how to live with CHF, the signs and symptoms of CHF, and how keep track of weight and sodium intake.   Heart Disease and Intimacy -Discus the effect sexual activity has on the heart, how changes occur during intimacy as we age, and safety during sexual activity.   Smoking Cessation / COPD -Discuss different methods to quit smoking, the health benefits of quitting smoking, and the definition of COPD.   Nutrition I: Fats -Discuss the types of cholesterol, what cholesterol does to the heart, and how cholesterol levels can be controlled.   Nutrition II: Labels -Discuss the different components of food labels and how to read food label   Heart Parts/Heart Disease and PAD -Discuss the anatomy of the heart, the pathway of blood circulation through the heart, and these are affected by heart disease.   Stress I: Signs and Symptoms -Discuss the causes of stress, how stress may lead to anxiety and depression, and ways to limit stress.   Stress II: Relaxation -Discuss  different types of relaxation techniques to limit stress.   Warning Signs of Stroke / TIA -Discuss definition of a stroke, what the signs and symptoms are of a stroke, and how to identify when someone is having stroke.   Knowledge Questionnaire Score:  Knowledge Questionnaire Score - 07/18/21 1020       Knowledge Questionnaire Score   Pre Score 19/24             Core Components/Risk Factors/Patient Goals at Admission:  Personal Goals and Risk Factors at Admission - 07/18/21 1020       Core Components/Risk Factors/Patient Goals on Admission    Weight Management Yes;Obesity;Weight Loss    Intervention Weight Management: Develop a combined nutrition and exercise program designed to reach desired caloric intake, while maintaining appropriate intake of nutrient and fiber, sodium and fats, and appropriate energy expenditure required for the weight goal.;Weight Management: Provide education and appropriate resources to help participant work on and attain dietary goals.;Weight Management/Obesity: Establish reasonable short term and long term weight goals.;Obesity: Provide education and appropriate resources to help participant work on and attain dietary goals.    Admit Weight 251 lb 5.2 oz (114 kg)    Expected Outcomes Understanding of distribution of calorie intake throughout the day with the consumption of 4-5 meals/snacks;Understanding recommendations for meals to include 15-35% energy as protein, 25-35% energy from fat, 35-60% energy from carbohydrates, less than 252m of dietary cholesterol, 20-35 gm of total fiber daily;Weight Loss: Understanding of general recommendations for a balanced deficit meal plan, which promotes 1-2 lb weight loss per week and includes a negative energy balance of [phone] kcal/d;Weight Maintenance: Understanding of the daily nutrition guidelines, which includes 25-35% calories from fat, 7% or less cal from saturated fats, less than 2076mcholesterol, less than  1.5gm of sodium, & 5 or more servings of fruits and vegetables daily;Long Term: Adherence to nutrition and physical activity/exercise program aimed toward attainment of established weight goal;Short Term: Continue to assess and modify interventions until short term weight is achieved    Hypertension Yes    Intervention Provide education on lifestyle modifcations including regular physical activity/exercise, weight management, moderate sodium restriction and increased consumption of fresh fruit, vegetables, and low fat dairy, alcohol moderation, and smoking cessation.;Monitor prescription use compliance.  Expected Outcomes Short Term: Continued assessment and intervention until BP is < 140/90mm HG in hypertensive participants. < 130/80mm HG in hypertensive participants with diabetes, heart failure or chronic kidney disease.;Long Term: Maintenance of blood pressure at goal levels.    Lipids Yes    Intervention Provide education and support for participant on nutrition & aerobic/resistive exercise along with prescribed medications to achieve LDL <70mg, HDL >40mg.    Expected Outcomes Short Term: Participant states understanding of desired cholesterol values and is compliant with medications prescribed. Participant is following exercise prescription and nutrition guidelines.;Long Term: Cholesterol controlled with medications as prescribed, with individualized exercise RX and with personalized nutrition plan. Value goals: LDL < 70mg, HDL > 40 mg.    Stress Yes    Intervention Offer individual and/or small group education and counseling on adjustment to heart disease, stress management and health-related lifestyle change. Teach and support self-help strategies.;Refer participants experiencing significant psychosocial distress to appropriate mental health specialists for further evaluation and treatment. When possible, include family members and significant others in education/counseling sessions.    Expected  Outcomes Short Term: Participant demonstrates changes in health-related behavior, relaxation and other stress management skills, ability to obtain effective social support, and compliance with psychotropic medications if prescribed.;Long Term: Emotional wellbeing is indicated by absence of clinically significant psychosocial distress or social isolation.             Core Components/Risk Factors/Patient Goals Review:   Goals and Risk Factor Review     Row Name 08/08/21 0720 09/05/21 1314           Core Components/Risk Factors/Patient Goals Review   Personal Goals Review Weight Management/Obesity;Hypertension;Lipids;Stress Weight Management/Obesity;Hypertension;Lipids;Stress      Review Benjimin has been doing well with exercise at cardiac rehab. Giorgio recently received steroid injections in his back and knees. Vital signs have been stable. Iseah has been doing well with exercise at cardiac rehab. Amaro continues to do with exercise.  Vital signs have been stable. Dorsey reports feeling stronger since he has been partcipating in phase 2 cardiac rehab.      Expected Outcomes Dadrian will continue to partcipate in phase 2 cardiac rehab for exercise, nutrition and lifestyle modifications Theseus will continue to partcipate in phase 2 cardiac rehab for exercise, nutrition and lifestyle modifications.               Core Components/Risk Factors/Patient Goals at Discharge (Final Review):   Goals and Risk Factor Review - 09/05/21 1314       Core Components/Risk Factors/Patient Goals Review   Personal Goals Review Weight Management/Obesity;Hypertension;Lipids;Stress    Review Hy has been doing well with exercise at cardiac rehab. Iziah continues to do with exercise.  Vital signs have been stable. Saed reports feeling stronger since he has been partcipating in phase 2 cardiac rehab.    Expected Outcomes Ollin will continue to partcipate in phase 2 cardiac rehab for exercise, nutrition  and lifestyle modifications.             ITP Comments:  ITP Comments     Row Name 07/18/21 0927 08/08/21 0715 09/05/21 1312       ITP Comments Dr Traci Turner MD, Medical Director 30 Day ITP Review. Bobbis has good participation and attendance for his fitness level. Bobby is off to a good start to exercise despite his orthopedic limitations 30 Day ITP Review. Johnaton has good participation and attendance in phase 2 cardiac rehab.                Comments: See ITP comments. Bobby will complete exercise at cardiac rehab on 09/15/32 Tamecia Mcdougald Walden Yuridiana Formanek RN BSN  

## 2021-09-05 NOTE — Progress Notes (Deleted)
Cardiac Individual Treatment Plan  Patient Details  Name: Martin Stephenson MRN: [id number] Date of Birth: 10-31-1941 Referring Provider:   Flowsheet Row CARDIAC REHAB PHASE II ORIENTATION from 07/18/2021 in [facility]  Referring Provider [name], MD       Initial Encounter Date:  [place] PHASE II ORIENTATION from 07/18/2021 in [facility]  Date 07/18/21       Visit Diagnosis: 03/14/21 S/P DES CFX/LM  Patient's Home Medications on Admission:  Current Outpatient Medications:    allopurinol (ZYLOPRIM) 300 MG tablet, Take 300 mg by mouth daily. , Disp: , Rfl:    amLODipine (NORVASC) 5 MG tablet, TAKE 1 TABLET BY MOUTH DAILY., Disp: 90 tablet, Rfl: 3   aspirin 81 MG tablet, Take 81 mg by mouth daily., Disp: , Rfl:    Calcium Carb-Cholecalciferol (CALCIUM 600/VITAMIN D3 PO), Take 1 tablet by mouth daily. , Disp: , Rfl:    clopidogrel (PLAVIX) 75 MG tablet, Take 1 tablet (75 mg total) by mouth daily., Disp: 90 tablet, Rfl: 3   ferrous sulfate 325 (65 FE) MG tablet, Take 325 mg by mouth every Monday, Wednesday, and Friday., Disp: , Rfl:    furosemide (LASIX) 40 MG tablet, Take 40 mg by mouth as needed., Disp: , Rfl:    losartan (COZAAR) 25 MG tablet, TAKE 1 TABLET BY MOUTH EVERY DAY IN THE EVENING, Disp: 90 tablet, Rfl: 1   losartan (COZAAR) 50 MG tablet, Take 1 tablet (50 mg total) by mouth every evening., Disp: 90 tablet, Rfl: 3   Nebivolol HCl (BYSTOLIC) 20 MG TABS, Take 1 tablet (20 mg total) by mouth daily. (Patient not taking: Reported on 07/18/2021), Disp: 90 tablet, Rfl: 3   rosuvastatin (CRESTOR) 10 MG tablet, Take 10 mg by mouth every Monday, Wednesday, and Friday., Disp: , Rfl:   Past Medical History: Past Medical History:  Diagnosis Date   Allergy    Anemia    Arthritis    foot   Congestive heart failure (HCC)    Constipation    uses mag citrate OTC if needed   GERD (gastroesophageal reflux  disease)    Hiatal hernia    Hyperlipidemia    Hypertension    Kidney stone    Sleep apnea    wears cpap    Tobacco Use: Social History   Tobacco Use  Smoking Status Never  Smokeless Tobacco Never    Labs: Recent Review Flowsheet Data     Labs for ITP Cardiac and Pulmonary Rehab Latest Ref Rng & Units 03/14/2021 03/14/2021 03/14/2021   PHART 7.350 - 7.450 - - 7.361   PCO2ART 32.0 - 48.0 mmHg - - 44.1   HCO3 20.0 - 28.0 mmol/L 25.9 26.5 24.9   TCO2 22 - 32 mmol/L _0 ACIDBASEDEF 0.0 - 2.0 mmol/L 1.0 - 1.0   O2SAT % 74.0 73.0 99.0       Capillary Blood Glucose: No results found for: GLUCAP   Exercise Target Goals: Exercise Program Goal: Individual exercise prescription set using results from initial 6 min walk test and THRR while considering  patient's activity barriers and safety.   Exercise Prescription Goal: Starting with aerobic activity 30 plus minutes a day, 3 days per week for initial exercise prescription. Provide home exercise prescription and guidelines that participant acknowledges understanding prior to discharge.  Activity Barriers & Risk Stratification:  Activity Barriers & Cardiac Risk Stratification - 07/18/21 1026  Activity Barriers & Cardiac Risk Stratification   Activity Barriers Arthritis;Back Problems;Neck/Spine Problems;Joint Problems;Deconditioning;Shortness of Breath;Balance Concerns;Other (comment)    Comments Uses a cane to walk, but only when knees become too painful.    Cardiac Risk Stratification High             6 Minute Walk:  6 Minute Walk     Row Name 07/18/21 0935         6 Minute Walk   Phase Initial     Distance 1000 feet     Walk Time 6 minutes     # of Rest Breaks 1  5:35-6:00     MPH 1.89     METS 1.71     RPE 11     Perceived Dyspnea  1     VO2 Peak 5.97     Symptoms Yes (comment)     Comments Low back tightness nad right knee tightness; states it is not pain. SOB with RPD = 1     Resting HR 73  bpm     Resting BP 108/74     Resting Oxygen Saturation  100 %     Exercise Oxygen Saturation  during 6 min walk 99 %     Max Ex. HR 81 bpm     Max Ex. BP 150/70     2 Minute Post BP 134/72              Oxygen Initial Assessment:   Oxygen Re-Evaluation:   Oxygen Discharge (Final Oxygen Re-Evaluation):   Initial Exercise Prescription:  Initial Exercise Prescription - 07/18/21 1000       Date of Initial Exercise RX and Referring Provider   Date 07/18/21    Referring Provider [name], MD    Expected Discharge Date 09/15/21      NuStep   Level 2    SPM 75    Minutes 25    METs 1.8      Prescription Details   Frequency (times per week) 3    Duration Progress to 30 minutes of continuous aerobic without signs/symptoms of physical distress      Intensity   THRR 40-80% of Max Heartrate 57-114    Ratings of Perceived Exertion 11-13    Perceived Dyspnea 0-4      Progression   Progression Continue progressive overload as per policy without signs/symptoms or physical distress.      Resistance Training   Training Prescription Yes    Weight 3 lbs    Reps 10-15             Perform Capillary Blood Glucose checks as needed.  Exercise Prescription Changes:   Exercise Prescription Changes     Row Name 07/24/21 1000 08/04/21 1500 08/09/21 1000 08/23/21 1037 09/01/21 1000     Response to Exercise   Blood Pressure (Admit) 130/66 148/62 140/78 130/60 138/62   Blood Pressure (Exercise) 140/70 140/70 148/70 156/80 160/78   Blood Pressure (Exit) 130/70 122/78 124/70 128/78 142/70   Heart Rate (Admit) 83 bpm 65 bpm 79 bpm 60 bpm 74 bpm   Heart Rate (Exercise) 78 bpm 90 bpm 93 bpm 107 bpm 99 bpm   Heart Rate (Exit) 60 bpm 56 bpm 70 bpm 70 bpm 74 bpm   Rating of Perceived Exertion (Exercise) _0 Symptoms _1    Comments Pt's first day in the CRP2 program Reviewed METs Reviewed Home exercise Rx Reviewed METs and  Goals Reviewed METs    Duration Progress to 30 minutes of  aerobic without signs/symptoms of physical distress Continue with 30 min of aerobic exercise without signs/symptoms of physical distress. Continue with 30 min of aerobic exercise without signs/symptoms of physical distress. Continue with 30 min of aerobic exercise without signs/symptoms of physical distress. Continue with 30 min of aerobic exercise without signs/symptoms of physical distress.   Intensity _0      Progression   Progression Continue to progress workloads to maintain intensity without signs/symptoms of physical distress. Continue to progress workloads to maintain intensity without signs/symptoms of physical distress. Continue to progress workloads to maintain intensity without signs/symptoms of physical distress. Continue to progress workloads to maintain intensity without signs/symptoms of physical distress. Continue to progress workloads to maintain intensity without signs/symptoms of physical distress.   Average METs 1.9 2.5 2.5 2.8 2.7     Resistance Training   Training Prescription Yes Yes No No Yes   Weight 3 lbs 3 lbs No weights on wednesday No weights on wednesday 3 lbs   Reps 10-15 10-15 -- -- 10-15   Time 10 Minutes 10 Minutes -- -- 10 Minutes     Interval Training   Interval Training _1      NuStep   Level _2 SPM 75 85 85 90 90   Minutes _3 METs 1.9 2.5 2.5 2.8 2.7     Home Exercise Plan   Plans to continue exercise at -- -- Home (comment) Home (comment) Home (comment)   Frequency -- -- Add 2 additional days to program exercise sessions. Add 2 additional days to program exercise sessions. Add 2 additional days to program exercise sessions.   Initial Home Exercises Provided -- -- 08/02/21 08/02/21 08/02/21            Exercise Comments:   Exercise Comments     Row Name 07/24/21 1029 08/04/21 1533 08/09/21 1024 08/23/21  0945 09/01/21 1000   Exercise Comments Pt's first day in the CRP2 program, No complaints with todays session and off to a good start. Reviewed METs. Pt is making good progress in the CRP2 program. Revfewed home exercise Rx. Pt verbalized understanding of the home exercise Rx and was provided a copy. Reviewed METs and goals. Pt is progressing toward goals. Reviewed  METs. Pt is currently at a MET plateau. Pt continues to put forth good effort in the CRP2 program. Pt is concerned about his knees, as pt has h/o problems and had recent injections. Will continue to encouraged patient to progress as he feels able.            Exercise Goals and Review:   Exercise Goals     Row Name 07/18/21 1027             Exercise Goals   Increase Physical Activity Yes       Intervention Provide advice, education, support and counseling about physical activity/exercise needs.;Develop an individualized exercise prescription for aerobic and resistive training based on initial evaluation findings, risk stratification, comorbidities and participant's personal goals.       Expected Outcomes Short Term: Attend rehab on a regular basis to increase amount of physical activity.;Long Term: Add in home exercise to make exercise part of routine and to increase amount of physical activity.;Long Term: Exercising regularly at least 3-5 days a week.  Increase Strength and Stamina Yes       Intervention Provide advice, education, support and counseling about physical activity/exercise needs.;Develop an individualized exercise prescription for aerobic and resistive training based on initial evaluation findings, risk stratification, comorbidities and participant's personal goals.       Expected Outcomes Short Term: Increase workloads from initial exercise prescription for resistance, speed, and METs.;Short Term: Perform resistance training exercises routinely during rehab and add in resistance training at home;Long Term:  Improve cardiorespiratory fitness, muscular endurance and strength as measured by increased METs and functional capacity (6MWT)       Able to understand and use rate of perceived exertion (RPE) scale Yes       Intervention Provide education and explanation on how to use RPE scale       Expected Outcomes Short Term: Able to use RPE daily in rehab to express subjective intensity level;Long Term:  Able to use RPE to guide intensity level when exercising independently       Knowledge and understanding of Target Heart Rate Range (THRR) Yes       Intervention Provide education and explanation of THRR including how the numbers were predicted and where they are located for reference       Expected Outcomes Short Term: Able to state/look up THRR;Short Term: Able to use daily as guideline for intensity in rehab;Long Term: Able to use THRR to govern intensity when exercising independently       Understanding of Exercise Prescription Yes       Intervention Provide education, explanation, and written materials on patient's individual exercise prescription       Expected Outcomes Short Term: Able to explain program exercise prescription;Long Term: Able to explain home exercise prescription to exercise independently                Exercise Goals Re-Evaluation :  Exercise Goals Re-Evaluation     [place] Name 07/24/21 1022 08/09/21 1022 08/23/21 0945 08/24/21 0840       Exercise Goal Re-Evaluation   Exercise Goals Review Increase Physical Activity;Increase Strength and Stamina;Able to understand and use rate of perceived exertion (RPE) scale;Knowledge and understanding of Target Heart Rate Range (THRR);Understanding of Exercise Prescription Increase Physical Activity;Increase Strength and Stamina;Able to understand and use rate of perceived exertion (RPE) scale;Knowledge and understanding of Target Heart Rate Range (THRR);Understanding of Exercise Prescription Increase Physical Activity;Increase Strength and  Stamina;Able to understand and use rate of perceived exertion (RPE) scale;Knowledge and understanding of Target Heart Rate Range (THRR);Able to check pulse independently;Understanding of Exercise Prescription --    Comments Pt's first day in the CRP2 program. Pt understands the exercise Rx, THRR, and RPE scale. Reviewed home exercise Rx. Pt has issues with he knees so he will do a combination of walking and marching from a seat position, Goal is 30 minutes 2x/week in addtion to exercise 3x/week in the CRP2 program. Reviewed METs and goals. Pt vocies that he is making progress on his goal to increase his stamina. He voices being able to increase to level 3 on the nustep is evidence of that. Feels positive about his progress. Long term goal to get back to walking. Pt still trying to include wlaks in his schedule. --    Expected Outcomes WIll continue to monitor and progress exercise workloads as tolerated. Pt will exercise at home 2x/week for a total of 30 minutes. Will continue to monitor patient and progress exercise workloads as tolerated. --  Discharge Exercise Prescription (Final Exercise Prescription Changes):  Exercise Prescription Changes - 09/01/21 1000       Response to Exercise   Blood Pressure (Admit) 138/62    Blood Pressure (Exercise) 160/78    Blood Pressure (Exit) 142/70    Heart Rate (Admit) 74 bpm    Heart Rate (Exercise) 99 bpm    Heart Rate (Exit) 74 bpm    Rating of Perceived Exertion (Exercise) 12    Symptoms None    Comments Reviewed METs    Duration Continue with 30 min of aerobic exercise without signs/symptoms of physical distress.    Intensity THRR unchanged      Progression   Progression Continue to progress workloads to maintain intensity without signs/symptoms of physical distress.    Average METs 2.7      Resistance Training   Training Prescription Yes    Weight 3 lbs    Reps 10-15    Time 10 Minutes      Interval Training   Interval  Training No      NuStep   Level 3    SPM 90    Minutes 30    METs 2.7      Home Exercise Plan   Plans to continue exercise at Home (comment)    Frequency Add 2 additional days to program exercise sessions.    Initial Home Exercises Provided 08/02/21             Nutrition:  Target Goals: Understanding of nutrition guidelines, daily intake of sodium <1573m, cholesterol <2067m calories 30% from fat and 7% or less from saturated fats, daily to have 5 or more servings of fruits and vegetables.  Biometrics:  Pre Biometrics - 07/18/21 0900       Pre Biometrics   Waist Circumference 46 inches    Hip Circumference 49 inches    Waist to Hip Ratio 0.94 %    Triceps Skinfold 16 mm    % Body Fat 31.6 %    Grip Strength 47 kg    Flexibility 13.5 in    Single Leg Stand 3.93 seconds   High fal risk             Nutrition Therapy Plan and Nutrition Goals:  Nutrition Therapy & Goals - 07/28/21 1005       Nutrition Therapy   Diet TLC    Drug/Food Interactions Statins/Certain Fruits      Personal Nutrition Goals   Nutrition Goal Pt to build a healthy plate including vegetables, fruits, whole grains, and low-fat dairy products in a heart healthy meal plan.    Personal Goal #2 Pt to reduce processed and red meat only 2 times per week    Personal Goal #3 Pt to reduce salt shaker use by tasting food and shaking salt into his palm      Intervention Plan   Intervention Prescribe, educate and counsel regarding individualized specific dietary modifications aiming towards targeted core components such as weight, hypertension, lipid management, diabetes, heart failure and other comorbidities.;Nutrition handout(s) given to patient.    Expected Outcomes Short Term Goal: A plan has been developed with personal nutrition goals set during dietitian appointment.;Long Term Goal: Adherence to prescribed nutrition plan.             Nutrition Assessments:  MEDIFICTS Score Key: ?70 Need  to make dietary changes  40-70 Heart Healthy Diet ? 40 Therapeutic Level Cholesterol Diet  Flowsheet Row CARDIAC REHAB PHASE II EXERCISE from 07/28/2021 in  [facility]  Picture Your Plate Total Score on Admission 44      Picture Your Plate Scores: <78 Unhealthy dietary pattern with much room for improvement. 41-50 Dietary pattern unlikely to meet recommendations for good health and room for improvement. 51-60 More healthful dietary pattern, with some room for improvement.  >60 Healthy dietary pattern, although there may be some specific behaviors that could be improved.    Nutrition Goals Re-Evaluation:  Nutrition Goals Re-Evaluation     Row Name 07/28/21 1006 08/03/21 0853 09/04/21 0940         Goals   Current Weight 251 lb (113.9 kg) 254 lb 3.1 oz (115.3 kg) 245 lb 9.5 oz (111.4 kg)     Nutrition Goal Pt to build a healthy plate including vegetables, fruits, whole grains, and low-fat dairy products in a heart healthy meal plan. Pt to build a healthy plate including vegetables, fruits, whole grains, and low-fat dairy products in a heart healthy meal plan. Pt to build a healthy plate including vegetables, fruits, whole grains, and low-fat dairy products in a heart healthy meal plan.     Comment -- -- Pt has lost 6 lbs.           Personal Goal #2 Re-Evaluation   Personal Goal #2 Pt to reduce processed and red meat only 2 times per week Pt to reduce processed and red meat only 2 times per week Pt to reduce processed and red meat only 2 times per week           Personal Goal #3 Re-Evaluation   Personal Goal #3 Pt to reduce salt shaker use by tasting food and shaking salt into his palm Pt to reduce salt shaker use by tasting food and shaking salt into his palm Pt to reduce salt shaker use by tasting food and shaking salt into his palm              Nutrition Goals Discharge (Final Nutrition Goals Re-Evaluation):  Nutrition Goals Re-Evaluation -  09/04/21 0940       Goals   Current Weight 245 lb 9.5 oz (111.4 kg)    Nutrition Goal Pt to build a healthy plate including vegetables, fruits, whole grains, and low-fat dairy products in a heart healthy meal plan.    Comment Pt has lost 6 lbs.      Personal Goal #2 Re-Evaluation   Personal Goal #2 Pt to reduce processed and red meat only 2 times per week      Personal Goal #3 Re-Evaluation   Personal Goal #3 Pt to reduce salt shaker use by tasting food and shaking salt into his palm             Psychosocial: Target Goals: Acknowledge presence or absence of significant depression and/or stress, maximize coping skills, provide positive support system. Participant is able to verbalize types and ability to use techniques and skills needed for reducing stress and depression.  Initial Review & Psychosocial Screening:  Initial Psych Review & Screening - 07/18/21 1129       Initial Review   Current issues with None Identified      Family Dynamics   Good Support System? Yes   Mr Petsch has his wife and children for support     Barriers   Psychosocial barriers to participate in program There are no identifiable barriers or psychosocial needs.      Screening Interventions   Interventions Encouraged to exercise  Quality of Life Scores:  Quality of Life - 07/18/21 1019       Quality of Life   Select Quality of Life      Quality of Life Scores   Health/Function Pre 20.93 %    Socioeconomic Pre 24.43 %    Psych/Spiritual Pre 25.71 %    Family Pre 23.5 %    GLOBAL Pre 23.01 %            Scores of 19 and below usually indicate a poorer quality of life in these areas.  A difference of  2-3 points is a clinically meaningful difference.  A difference of 2-3 points in the total score of the Quality of Life Index has been associated with significant improvement in overall quality of life, self-image, physical symptoms, and general health in studies assessing  change in quality of life.  PHQ-9: Recent Review Flowsheet Data     Depression screen [facility] 2/9 07/18/2021   Decreased Interest 0   Down, Depressed, Hopeless 0   PHQ - 2 Score 0      Interpretation of Total Score  Total Score Depression Severity:  1-4 = Minimal depression, 5-9 = Mild depression, 10-14 = Moderate depression, 15-19 = Moderately severe depression, 20-27 = Severe depression   Psychosocial Evaluation and Intervention:   Psychosocial Re-Evaluation:  Psychosocial Re-Evaluation     [place] Name 08/08/21 0717 09/05/21 1314           Psychosocial Re-Evaluation   Current issues with None Identified None Identified      Interventions Encouraged to attend Cardiac Rehabilitation for the exercise Encouraged to attend Cardiac Rehabilitation for the exercise      Continue Psychosocial Services  No Follow up required No Follow up required               Psychosocial Discharge (Final Psychosocial Re-Evaluation):  Psychosocial Re-Evaluation - 09/05/21 1314       Psychosocial Re-Evaluation   Current issues with None Identified    Interventions Encouraged to attend Cardiac Rehabilitation for the exercise    Continue Psychosocial Services  No Follow up required             Vocational Rehabilitation: Provide vocational rehab assistance to qualifying candidates.   Vocational Rehab Evaluation & Intervention:  Vocational Rehab - 07/18/21 1130       Initial Vocational Rehab Evaluation & Intervention   Assessment shows need for Vocational Rehabilitation No   Frazer is semi retired and does not need vocational rehab at this time.            Education: Education Goals: Education classes will be provided on a weekly basis, covering required topics. Participant will state understanding/return demonstration of topics presented.  Learning Barriers/Preferences:  Learning Barriers/Preferences - 07/18/21 1021       Learning Barriers/Preferences   Learning Preferences  Computer/Internet;Pictoral;Video             Education Topics: Hypertension, Hypertension Reduction -Define heart disease and high blood pressure. Discus how high blood pressure affects the body and ways to reduce high blood pressure.   Exercise and Your Heart -Discuss why it is important to exercise, the FITT principles of exercise, normal and abnormal responses to exercise, and how to exercise safely.   Angina -Discuss definition of angina, causes of angina, treatment of angina, and how to decrease risk of having angina.   Cardiac Medications -Review what the following cardiac medications are used for, how they affect the body, and side  effects that may occur when taking the medications.  Medications include Aspirin, Beta blockers, calcium channel blockers, ACE Inhibitors, angiotensin receptor blockers, diuretics, digoxin, and antihyperlipidemics.   Congestive Heart Failure -Discuss the definition of CHF, how to live with CHF, the signs and symptoms of CHF, and how keep track of weight and sodium intake.   Heart Disease and Intimacy -Discus the effect sexual activity has on the heart, how changes occur during intimacy as we age, and safety during sexual activity.   Smoking Cessation / COPD -Discuss different methods to quit smoking, the health benefits of quitting smoking, and the definition of COPD.   Nutrition I: Fats -Discuss the types of cholesterol, what cholesterol does to the heart, and how cholesterol levels can be controlled.   Nutrition II: Labels -Discuss the different components of food labels and how to read food label   Heart Parts/Heart Disease and PAD -Discuss the anatomy of the heart, the pathway of blood circulation through the heart, and these are affected by heart disease.   Stress I: Signs and Symptoms -Discuss the causes of stress, how stress may lead to anxiety and depression, and ways to limit stress.   Stress II: Relaxation -Discuss  different types of relaxation techniques to limit stress.   Warning Signs of Stroke / TIA -Discuss definition of a stroke, what the signs and symptoms are of a stroke, and how to identify when someone is having stroke.   Knowledge Questionnaire Score:  Knowledge Questionnaire Score - 07/18/21 1020       Knowledge Questionnaire Score   Pre Score 19/24             Core Components/Risk Factors/Patient Goals at Admission:  Personal Goals and Risk Factors at Admission - 07/18/21 1020       Core Components/Risk Factors/Patient Goals on Admission    Weight Management Yes;Obesity;Weight Loss    Intervention Weight Management: Develop a combined nutrition and exercise program designed to reach desired caloric intake, while maintaining appropriate intake of nutrient and fiber, sodium and fats, and appropriate energy expenditure required for the weight goal.;Weight Management: Provide education and appropriate resources to help participant work on and attain dietary goals.;Weight Management/Obesity: Establish reasonable short term and long term weight goals.;Obesity: Provide education and appropriate resources to help participant work on and attain dietary goals.    Admit Weight 251 lb 5.2 oz (114 kg)    Expected Outcomes Understanding of distribution of calorie intake throughout the day with the consumption of 4-5 meals/snacks;Understanding recommendations for meals to include 15-35% energy as protein, 25-35% energy from fat, 35-60% energy from carbohydrates, less than 252m of dietary cholesterol, 20-35 gm of total fiber daily;Weight Loss: Understanding of general recommendations for a balanced deficit meal plan, which promotes 1-2 lb weight loss per week and includes a negative energy balance of [phone] kcal/d;Weight Maintenance: Understanding of the daily nutrition guidelines, which includes 25-35% calories from fat, 7% or less cal from saturated fats, less than 2076mcholesterol, less than  1.5gm of sodium, & 5 or more servings of fruits and vegetables daily;Long Term: Adherence to nutrition and physical activity/exercise program aimed toward attainment of established weight goal;Short Term: Continue to assess and modify interventions until short term weight is achieved    Hypertension Yes    Intervention Provide education on lifestyle modifcations including regular physical activity/exercise, weight management, moderate sodium restriction and increased consumption of fresh fruit, vegetables, and low fat dairy, alcohol moderation, and smoking cessation.;Monitor prescription use compliance.  Expected Outcomes Short Term: Continued assessment and intervention until BP is < 140/38m HG in hypertensive participants. < 130/868mHG in hypertensive participants with diabetes, heart failure or chronic kidney disease.;Long Term: Maintenance of blood pressure at goal levels.    Lipids Yes    Intervention Provide education and support for participant on nutrition & aerobic/resistive exercise along with prescribed medications to achieve LDL <7065mHDL >4m33m  Expected Outcomes Short Term: Participant states understanding of desired cholesterol values and is compliant with medications prescribed. Participant is following exercise prescription and nutrition guidelines.;Long Term: Cholesterol controlled with medications as prescribed, with individualized exercise RX and with personalized nutrition plan. Value goals: LDL < 70mg73mL > 40 mg.    Stress Yes    Intervention Offer individual and/or small group education and counseling on adjustment to heart disease, stress management and health-related lifestyle change. Teach and support self-help strategies.;Refer participants experiencing significant psychosocial distress to appropriate mental health specialists for further evaluation and treatment. When possible, include family members and significant others in education/counseling sessions.    Expected  Outcomes Short Term: Participant demonstrates changes in health-related behavior, relaxation and other stress management skills, ability to obtain effective social support, and compliance with psychotropic medications if prescribed.;Long Term: Emotional wellbeing is indicated by absence of clinically significant psychosocial distress or social isolation.             Core Components/Risk Factors/Patient Goals Review:   Goals and Risk Factor Review     Row Name 08/08/21 0720 09/05/21 1314           Core Components/Risk Factors/Patient Goals Review   Personal Goals Review Weight Management/Obesity;Hypertension;Lipids;Stress Weight Management/Obesity;Hypertension;Lipids;Stress      Review BobbiJesbeen doing well with exercise at cardiac rehab. BobbiManojntly received steroid injections in his back and knees. Vital signs have been stable. BobbiCidbeen doing well with exercise at cardiac rehab. BobbiKurtinues to do with exercise.  Vital signs have been stable. BobbiJaquinrts feeling stronger since he has been partcipating in phase 2 cardiac rehab.      Expected Outcomes BobbiShashank continue to partcipate in phase 2 cardiac rehab for exercise, nutrition and lifestyle modifications BobbiChannin continue to partcipate in phase 2 cardiac rehab for exercise, nutrition and lifestyle modifications.               Core Components/Risk Factors/Patient Goals at Discharge (Final Review):   Goals and Risk Factor Review - 09/05/21 1314       Core Components/Risk Factors/Patient Goals Review   Personal Goals Review Weight Management/Obesity;Hypertension;Lipids;Stress    Review BobbiSarimbeen doing well with exercise at cardiac rehab. BobbiAeroinues to do with exercise.  Vital signs have been stable. BobbiVerdellrts feeling stronger since he has been partcipating in phase 2 cardiac rehab.    Expected Outcomes BobbiJavonn continue to partcipate in phase 2 cardiac rehab for exercise, nutrition  and lifestyle modifications.             ITP Comments:  ITP Comments     Row Name 07/18/21 0927 [id number] 0715 09/05/21 1312       ITP Comments Dr TraciFransico HimMedical Director 30 Day ITP Review. Bobbis has good participation and attendance for his fitness level. BobbyMortimer Friesff to a good start to exercise despite his orthopedic limitations 30 Day ITP Review. BobbiKendricgood participation and attendance in phase 2 cardiac rehab.  Comments: See ITP comments. Mortimer Fries will complete exercise at cardiac rehab on 09/15/32 Harrell Gave RN BSN

## 2021-09-06 ENCOUNTER — Encounter (HOSPITAL_COMMUNITY): Payer: Medicare Other

## 2021-09-06 ENCOUNTER — Ambulatory Visit (HOSPITAL_COMMUNITY): Payer: Medicare Other

## 2021-09-08 ENCOUNTER — Other Ambulatory Visit: Payer: Self-pay

## 2021-09-08 ENCOUNTER — Ambulatory Visit (HOSPITAL_COMMUNITY): Payer: Medicare Other

## 2021-09-08 ENCOUNTER — Encounter (HOSPITAL_COMMUNITY)
Admission: RE | Admit: 2021-09-08 | Discharge: 2021-09-08 | Disposition: A | Discharge status: Home / Self Care | Payer: Medicare Other | Source: Ambulatory Visit | Attending: Cardiology | Admitting: Cardiology

## 2021-09-08 DIAGNOSIS — Z955 Presence of coronary angioplasty implant and graft: Secondary | ICD-10-CM | POA: Diagnosis not present

## 2021-09-11 ENCOUNTER — Other Ambulatory Visit: Payer: Self-pay

## 2021-09-11 ENCOUNTER — Telehealth: Payer: Self-pay

## 2021-09-11 ENCOUNTER — Ambulatory Visit (HOSPITAL_COMMUNITY): Payer: Medicare Other

## 2021-09-11 ENCOUNTER — Encounter (HOSPITAL_COMMUNITY)
Admission: RE | Admit: 2021-09-11 | Discharge: 2021-09-11 | Disposition: A | Discharge status: Home / Self Care | Payer: Medicare Other | Source: Ambulatory Visit | Attending: Cardiology | Admitting: Cardiology

## 2021-09-11 VITALS — Ht 75.0 in | Wt 246.9 lb

## 2021-09-11 DIAGNOSIS — I10 Essential (primary) hypertension: Secondary | ICD-10-CM

## 2021-09-11 DIAGNOSIS — Z955 Presence of coronary angioplasty implant and graft: Secondary | ICD-10-CM

## 2021-09-11 DIAGNOSIS — I25118 Atherosclerotic heart disease of native coronary artery with other forms of angina pectoris: Secondary | ICD-10-CM

## 2021-09-11 MED ORDER — NEBIVOLOL HCL 20 MG PO TABS: 20.0000 mg | ORAL_TABLET | Freq: Every day | ORAL | 3 refills | Status: DC

## 2021-09-11 NOTE — Progress Notes (Signed)
Ricke will complete phase 2 cardiac rehab on 09/13/21. Medications reviewed. Mr Prophete stopped taking his labetalol due to complaints of a rash. Dr Einar Gip prescribed bystolic twice a day. Back in June. Mr Wenzinger never started the medication. Mr Pierman mentioned that he was supposed to get the medication filled at the [place]. Will notify Dr Irven Shelling office and send vital signs from cardiac rehab to Dr Irven Shelling office for review.Barnet Pall, RN,BSN 09/11/2021 10:16 AM

## 2021-09-13 ENCOUNTER — Encounter (HOSPITAL_COMMUNITY)
Admission: RE | Admit: 2021-09-13 | Discharge: 2021-09-13 | Disposition: A | Discharge status: Home / Self Care | Payer: Medicare Other | Source: Ambulatory Visit | Attending: Cardiology | Admitting: Cardiology

## 2021-09-13 ENCOUNTER — Ambulatory Visit (HOSPITAL_COMMUNITY): Payer: Medicare Other

## 2021-09-13 ENCOUNTER — Other Ambulatory Visit: Payer: Self-pay

## 2021-09-13 DIAGNOSIS — Z955 Presence of coronary angioplasty implant and graft: Secondary | ICD-10-CM | POA: Diagnosis not present

## 2021-09-13 NOTE — Progress Notes (Signed)
Discharge Progress Report  Patient Details  Name: Martin Stephenson MRN: [id number] Date of Birth: 1941/01/18 Referring Provider:   Flowsheet Row CARDIAC REHAB PHASE II ORIENTATION from 07/18/2021 in [place]  Referring Provider [name], MD        Number of Visits: 19  Reason for Discharge:  Patient reached a stable level of exercise. Patient has met program and personal goals.  Smoking History:  Social History   Tobacco Use  Smoking Status Never  Smokeless Tobacco Never    Diagnosis:  03/14/21 S/P DES CFX/LM  ADL UCSD:   Initial Exercise Prescription:  Initial Exercise Prescription - 07/18/21 1000       Date of Initial Exercise RX and Referring Provider   Date 07/18/21    Referring Provider [name], MD    Expected Discharge Date 09/15/21      NuStep   Level 2    SPM 75    Minutes 25    METs 1.8      Prescription Details   Frequency (times per week) 3    Duration Progress to 30 minutes of continuous aerobic without signs/symptoms of physical distress      Intensity   THRR 40-80% of Max Heartrate 57-114    Ratings of Perceived Exertion 11-13    Perceived Dyspnea 0-4      Progression   Progression Continue progressive overload as per policy without signs/symptoms or physical distress.      Resistance Training   Training Prescription Yes    Weight 3 lbs    Reps 10-15             Discharge Exercise Prescription (Final Exercise Prescription Changes):  Exercise Prescription Changes - 09/13/21 1000       Response to Exercise   Blood Pressure (Admit) 128/60    Blood Pressure (Exercise) 150/70    Blood Pressure (Exit) 120/60    Heart Rate (Admit) 79 bpm    Heart Rate (Exercise) 107 bpm    Heart Rate (Exit) 79 bpm    Rating of Perceived Exertion (Exercise) 13    Symptoms None    Comments Pt graduated from the [facility]2 program    Duration Continue with 30 min of aerobic exercise without signs/symptoms of physical  distress.    Intensity THRR unchanged      Progression   Progression Continue to progress workloads to maintain intensity without signs/symptoms of physical distress.    Average METs 2.6      Resistance Training   Training Prescription No    Weight No weights on Wednesdays      Interval Training   Interval Training No      NuStep   Level 3    SPM 90    Minutes 30    METs 2.6      Home Exercise Plan   Plans to continue exercise at Home (comment)    Frequency Add 2 additional days to program exercise sessions.    Initial Home Exercises Provided 08/02/21             Functional Capacity:  6 Minute Walk     Row Name 07/18/21 0935 09/13/21 0850       6 Minute Walk   Phase Initial Discharge    Distance 1000 feet 1229 feet    Distance % Change -- 22.9 %    Distance Feet Change -- 229 ft    Walk Time 6 minutes 6 minutes    #  of Rest Breaks 1  5:35-6:00 0    MPH 1.89 2.33    METS 1.71 2.44    RPE 11 11    Perceived Dyspnea  1 0    VO2 Peak 5.97 8.54    Symptoms Yes (comment) No    Comments Low back tightness nad right knee tightness; states it is not pain. SOB with RPD = 1 --    Resting HR 73 bpm 79 bpm    Resting BP 108/74 128/60    Resting Oxygen Saturation  100 % 98 %    Exercise Oxygen Saturation  during 6 min walk 99 % 100 %    Max Ex. HR 81 bpm 107 bpm    Max Ex. BP 150/70 150/70    2 Minute Post BP 134/72 --             Psychological, QOL, Others - Outcomes: PHQ 2/9: Depression screen [facility] 2/9 09/13/2021 07/18/2021  Decreased Interest 0 0  Down, Depressed, Hopeless 0 0  PHQ - 2 Score 0 0    Quality of Life:  Quality of Life - 09/13/21 1029       Quality of Life   Select Quality of Life      Quality of Life Scores   Health/Function Pre 20.93 %    Health/Function Post 23.13 %    Health/Function % Change 10.51 %    Socioeconomic Pre 24.43 %    Socioeconomic Post 22.93 %    Socioeconomic % Change  -6.14 %    Psych/Spiritual Pre 25.71 %     Psych/Spiritual Post 24.64 %    Psych/Spiritual % Change -4.16 %    Family Pre 23.5 %    Family Post 23.7 %    Family % Change 0.85 %    GLOBAL Pre 23.01 %    GLOBAL Post 23.49 %    GLOBAL % Change 2.09 %             Personal Goals: Goals established at orientation with interventions provided to work toward goal.  Personal Goals and Risk Factors at Admission - 07/18/21 1020       Core Components/Risk Factors/Patient Goals on Admission    Weight Management Yes;Obesity;Weight Loss    Intervention Weight Management: Develop a combined nutrition and exercise program designed to reach desired caloric intake, while maintaining appropriate intake of nutrient and fiber, sodium and fats, and appropriate energy expenditure required for the weight goal.;Weight Management: Provide education and appropriate resources to help participant work on and attain dietary goals.;Weight Management/Obesity: Establish reasonable short term and long term weight goals.;Obesity: Provide education and appropriate resources to help participant work on and attain dietary goals.    Admit Weight 251 lb 5.2 oz (114 kg)    Expected Outcomes Understanding of distribution of calorie intake throughout the day with the consumption of 4-5 meals/snacks;Understanding recommendations for meals to include 15-35% energy as protein, 25-35% energy from fat, 35-60% energy from carbohydrates, less than 266m of dietary cholesterol, 20-35 gm of total fiber daily;Weight Loss: Understanding of general recommendations for a balanced deficit meal plan, which promotes 1-2 lb weight loss per week and includes a negative energy balance of [phone] kcal/d;Weight Maintenance: Understanding of the daily nutrition guidelines, which includes 25-35% calories from fat, 7% or less cal from saturated fats, less than 2018mcholesterol, less than 1.5gm of sodium, & 5 or more servings of fruits and vegetables daily;Long Term: Adherence to nutrition and  physical activity/exercise program aimed toward  attainment of established weight goal;Short Term: Continue to assess and modify interventions until short term weight is achieved    Hypertension Yes    Intervention Provide education on lifestyle modifcations including regular physical activity/exercise, weight management, moderate sodium restriction and increased consumption of fresh fruit, vegetables, and low fat dairy, alcohol moderation, and smoking cessation.;Monitor prescription use compliance.    Expected Outcomes Short Term: Continued assessment and intervention until BP is < 140/58m HG in hypertensive participants. < 130/830mHG in hypertensive participants with diabetes, heart failure or chronic kidney disease.;Long Term: Maintenance of blood pressure at goal levels.    Lipids Yes    Intervention Provide education and support for participant on nutrition & aerobic/resistive exercise along with prescribed medications to achieve LDL <7060mHDL >12m57m  Expected Outcomes Short Term: Participant states understanding of desired cholesterol values and is compliant with medications prescribed. Participant is following exercise prescription and nutrition guidelines.;Long Term: Cholesterol controlled with medications as prescribed, with individualized exercise RX and with personalized nutrition plan. Value goals: LDL < 70mg19mL > 40 mg.    Stress Yes    Intervention Offer individual and/or small group education and counseling on adjustment to heart disease, stress management and health-related lifestyle change. Teach and support self-help strategies.;Refer participants experiencing significant psychosocial distress to appropriate mental health specialists for further evaluation and treatment. When possible, include family members and significant others in education/counseling sessions.    Expected Outcomes Short Term: Participant demonstrates changes in health-related behavior, relaxation and other stress  management skills, ability to obtain effective social support, and compliance with psychotropic medications if prescribed.;Long Term: Emotional wellbeing is indicated by absence of clinically significant psychosocial distress or social isolation.              Personal Goals Discharge:  Goals and Risk Factor Review     Row Name 08/08/21 0720 09/05/21 1314 09/20/21 1551         Core Components/Risk Factors/Patient Goals Review   Personal Goals Review Weight Management/Obesity;Hypertension;Lipids;Stress Weight Management/Obesity;Hypertension;Lipids;Stress Weight Management/Obesity;Hypertension;Lipids;Stress     Review BobbiMarsdenbeen doing well with exercise at cardiac rehab. BobbiDevrinntly received steroid injections in his back and knees. Vital signs have been stable. BobbiObduliobeen doing well with exercise at cardiac rehab. BobbiSeverusinues to do with exercise.  Vital signs have been stable. BobbiGeralrts feeling stronger since he has been partcipating in phase 2 cardiac rehab. Talis completed phase 2 cardiac rehab on 09/13/21. BobbiSemajerts feeling stronger since he has been partcipating in phase 2 cardiac rehab.     Expected Outcomes BobbiMattis continue to partcipate in phase 2 cardiac rehab for exercise, nutrition and lifestyle modifications BobbiHeinrich continue to partcipate in phase 2 cardiac rehab for exercise, nutrition and lifestyle modifications. BobbiBrandin continue  exercise, follow  nutrition and lifestyle modifications upon completion of phase 2 cardiac rehab.              Exercise Goals and Review:  Exercise Goals     Row Name 07/18/21 1027             Exercise Goals   Increase Physical Activity Yes       Intervention Provide advice, education, support and counseling about physical activity/exercise needs.;Develop an individualized exercise prescription for aerobic and resistive training based on initial evaluation findings, risk stratification, comorbidities  and participant's personal goals.       Expected Outcomes Short Term: Attend rehab on a regular basis to increase amount  of physical activity.;Long Term: Add in home exercise to make exercise part of routine and to increase amount of physical activity.;Long Term: Exercising regularly at least 3-5 days a week.       Increase Strength and Stamina Yes       Intervention Provide advice, education, support and counseling about physical activity/exercise needs.;Develop an individualized exercise prescription for aerobic and resistive training based on initial evaluation findings, risk stratification, comorbidities and participant's personal goals.       Expected Outcomes Short Term: Increase workloads from initial exercise prescription for resistance, speed, and METs.;Short Term: Perform resistance training exercises routinely during rehab and add in resistance training at home;Long Term: Improve cardiorespiratory fitness, muscular endurance and strength as measured by increased METs and functional capacity (6MWT)       Able to understand and use rate of perceived exertion (RPE) scale Yes       Intervention Provide education and explanation on how to use RPE scale       Expected Outcomes Short Term: Able to use RPE daily in rehab to express subjective intensity level;Long Term:  Able to use RPE to guide intensity level when exercising independently       Knowledge and understanding of Target Heart Rate Range (THRR) Yes       Intervention Provide education and explanation of THRR including how the numbers were predicted and where they are located for reference       Expected Outcomes Short Term: Able to state/look up THRR;Short Term: Able to use daily as guideline for intensity in rehab;Long Term: Able to use THRR to govern intensity when exercising independently       Understanding of Exercise Prescription Yes       Intervention Provide education, explanation, and written materials on patient's individual  exercise prescription       Expected Outcomes Short Term: Able to explain program exercise prescription;Long Term: Able to explain home exercise prescription to exercise independently                Exercise Goals Re-Evaluation:  Exercise Goals Re-Evaluation     [place] Name 07/24/21 1022 08/09/21 1022 08/23/21 0945 08/24/21 0840 08/24/21 1033     Exercise Goal Re-Evaluation   Exercise Goals Review Increase Physical Activity;Increase Strength and Stamina;Able to understand and use rate of perceived exertion (RPE) scale;Knowledge and understanding of Target Heart Rate Range (THRR);Understanding of Exercise Prescription Increase Physical Activity;Increase Strength and Stamina;Able to understand and use rate of perceived exertion (RPE) scale;Knowledge and understanding of Target Heart Rate Range (THRR);Understanding of Exercise Prescription Increase Physical Activity;Increase Strength and Stamina;Able to understand and use rate of perceived exertion (RPE) scale;Knowledge and understanding of Target Heart Rate Range (THRR);Able to check pulse independently;Understanding of Exercise Prescription -- Increase Physical Activity;Increase Strength and Stamina;Able to understand and use rate of perceived exertion (RPE) scale;Knowledge and understanding of Target Heart Rate Range (THRR);Able to check pulse independently;Understanding of Exercise Prescription   Comments Pt's first day in the CRP2 program. Pt understands the exercise Rx, THRR, and RPE scale. Reviewed home exercise Rx. Pt has issues with he knees so he will do a combination of walking and marching from a seat position, Goal is 30 minutes 2x/week in addtion to exercise 3x/week in the CRP2 program. Reviewed METs and goals. Pt vocies that he is making progress on his goal to increase his stamina. He voices being able to increase to level 3 on the nustep is evidence of that. Feels positive about his progress.  Long term goal to get back to walking. Pt still  trying to include wlaks in his schedule. -- Reviewed METs and goals. Pt vocies that he is making progress on his goal to increase his stamina. He voices being able to increase to level 3 on the nustep is evidence of that. Feels positive about his progress. Long term goal to get back to walking. Pt still trying to include walks in his schedule.   Expected Outcomes WIll continue to monitor and progress exercise workloads as tolerated. Pt will exercise at home 2x/week for a total of 30 minutes. Will continue to monitor patient and progress exercise workloads as tolerated. -- Will continue to monitor patient and progress exercise workloads as tolerated.    [place] Name 09/13/21 1031             Exercise Goal Re-Evaluation   Exercise Goals Review Increase Physical Activity;Increase Strength and Stamina;Able to understand and use rate of perceived exertion (RPE) scale;Knowledge and understanding of Target Heart Rate Range (THRR);Able to check pulse independently;Understanding of Exercise Prescription       Comments Pt graduated from the [place] program. Pt had a peak MET level of 2.9 and voices feeling positve about the progress he has made. Pt encouraged to continiue to walk and march in place for his exercise at home. Pt encouraged to exercise 5x/week for 30 minutes.       Expected Outcomes Pt will continue to exercise at home on his own.                Nutrition & Weight - Outcomes:  Pre Biometrics - 07/18/21 0900       Pre Biometrics   Waist Circumference 46 inches    Hip Circumference 49 inches    Waist to Hip Ratio 0.94 %    Triceps Skinfold 16 mm    % Body Fat 31.6 %    Grip Strength 47 kg    Flexibility 13.5 in    Single Leg Stand 3.93 seconds   High fal risk            Post Biometrics - 09/11/21 0950        Post  Biometrics   Height _0  (1.905 m)    Weight 112 kg    Waist Circumference 46 inches    Hip Circumference 49 inches    Waist to Hip Ratio 0.94 %    BMI  (Calculated) 30.86    Triceps Skinfold 17 mm    % Body Fat 31.6 %    Grip Strength 48 kg    Flexibility 14.25 in    Single Leg Stand 4.18 seconds             Nutrition:  Nutrition Therapy & Goals - 07/28/21 1005       Nutrition Therapy   Diet TLC    Drug/Food Interactions Statins/Certain Fruits      Personal Nutrition Goals   Nutrition Goal Pt to build a healthy plate including vegetables, fruits, whole grains, and low-fat dairy products in a heart healthy meal plan.    Personal Goal #2 Pt to reduce processed and red meat only 2 times per week    Personal Goal #3 Pt to reduce salt shaker use by tasting food and shaking salt into his palm      Intervention Plan   Intervention Prescribe, educate and counsel regarding individualized specific dietary modifications aiming towards targeted core components such as weight, hypertension, lipid management, diabetes, heart  failure and other comorbidities.;Nutrition handout(s) given to patient.    Expected Outcomes Short Term Goal: A plan has been developed with personal nutrition goals set during dietitian appointment.;Long Term Goal: Adherence to prescribed nutrition plan.             Nutrition Discharge:   Education Questionnaire Score:  Knowledge Questionnaire Score - 09/13/21 1030       Knowledge Questionnaire Score   Post Score 20/24             Goals reviewed with patient; copy given to patient.Pt graduated from cardiac rehab program on 09/13/21  with completion of  exercise sessions in Phase II. Pt maintained good attendance and progressed nicely during his participation in rehab as evidenced by increased MET level.   Medication list reconciled. Repeat  PHQ score- 0 .  Pt has made significant lifestyle changes and should be commended for his success. Pt feels he has achieved his goals during cardiac rehab.   Pt plans to continue exercise by walking as tolerated and stretching at home. We are proud of Arrie's progress!  Nikos reports feeling stronger and says that cardiac rehab has been helpful for him. Tery increased his distance on his post exercise walk test by 229 feet and lost 4.3 kg! Barnet Pall, RN,BSN 09/20/2021 4:04 PM

## 2021-09-13 NOTE — Telephone Encounter (Signed)
Yes, he should be on Bystolic

## 2021-09-15 ENCOUNTER — Ambulatory Visit (HOSPITAL_COMMUNITY): Payer: Medicare Other

## 2021-09-15 ENCOUNTER — Encounter (HOSPITAL_COMMUNITY): Payer: Medicare Other

## 2021-10-06 ENCOUNTER — Other Ambulatory Visit: Payer: Self-pay | Admitting: Cardiology

## 2021-10-06 DIAGNOSIS — I10 Essential (primary) hypertension: Secondary | ICD-10-CM

## 2021-11-23 HISTORY — PX: CERVICAL DISCECTOMY: SHX98

## 2021-11-29 ENCOUNTER — Encounter (HOSPITAL_COMMUNITY): Payer: Self-pay

## 2021-11-29 ENCOUNTER — Other Ambulatory Visit: Payer: Self-pay

## 2021-11-29 ENCOUNTER — Ambulatory Visit (HOSPITAL_COMMUNITY)
Admission: EM | Admit: 2021-11-29 | Discharge: 2021-11-29 | Disposition: A | Discharge status: Home / Self Care | Payer: Medicare Other | Attending: Family Medicine | Admitting: Family Medicine

## 2021-11-29 DIAGNOSIS — R197 Diarrhea, unspecified: Secondary | ICD-10-CM | POA: Diagnosis not present

## 2021-11-29 NOTE — ED Triage Notes (Signed)
Pt reports he had over 20 watery stool since yesterday and belching. Pt think this can be food poison or side effect of injection for lower back pain, was placed 2 days ago.   Pt reports he has neck discectomy for 12/14/2021.

## 2021-11-29 NOTE — ED Provider Notes (Signed)
[place]   [id number] 11/29/21 Arrival Time: 3662  ASSESSMENT & PLAN:  1. Diarrhea, unspecified type    No signs of dehydration requiring IVF. Tolerating PO fluid intake. Benign abd exam.  Discussed typical duration of symptoms for GI illness. Feels he is somewhat better today. Will do his best to ensure adequate fluid intake in order to avoid dehydration. Will proceed to the Emergency Department for evaluation if unable to tolerate PO fluids regularly.   Follow-up Information     Tisovec, Fransico Him, MD.   Specialty: Internal Medicine Why: As needed. Contact information: [place] [address] [address].   Specialty: Emergency Medicine Why: If symptoms worsen in any way. Contact information: [address] [id number] [place] [phone]                 Reviewed expectations re: course of current medical issues. Questions answered. Outlined signs and symptoms indicating need for more acute intervention. Patient verbalized understanding. After Visit Summary given.   SUBJECTIVE: History from: patient and family.  Martin Stephenson is a 80 y.o. male who presents with complaint of non-bloody diarrhea.  Abrupt onset yesterday afternoon; watery. With freq belching. Started after egg and Spam sandwich. Afebrile. No abd/back pain.  Sick contacts: none. Recent travel or camping: none. OTC treatment: none.  Past Surgical History:  Procedure Laterality Date   CARDIAC CATHETERIZATION     COLONOSCOPY     CORONARY ATHERECTOMY N/A 03/14/2021   Procedure: CORONARY ATHERECTOMY;  Surgeon: [name], MD;  Location: [facility];  Service: Cardiovascular;  Laterality: N/A;   CORONARY BALLOON ANGIOPLASTY N/A 03/14/2021   Procedure: CORONARY BALLOON ANGIOPLASTY;  Surgeon: [name], MD;  Location: [facility];  Service:  Cardiovascular;  Laterality: N/A;   CORONARY STENT INTERVENTION N/A 03/14/2021   Procedure: CORONARY STENT INTERVENTION;  Surgeon: [name], MD;  Location: [facility];  Service: Cardiovascular;  Laterality: N/A;   HAMMER TOE SURGERY  1998   1st toe right foot   hydrocelectomy  1988   INTRAVASCULAR ULTRASOUND/IVUS N/A 03/14/2021   Procedure: Intravascular Ultrasound/IVUS;  Surgeon: [name], MD;  Location: [facility];  Service: Cardiovascular;  Laterality: N/A;   POLYPECTOMY     RIGHT/LEFT HEART CATH AND CORONARY ANGIOGRAPHY N/A 03/14/2021   Procedure: RIGHT/LEFT HEART CATH AND CORONARY ANGIOGRAPHY;  Surgeon: [name], MD;  Location: [facility];  Service: Cardiovascular;  Laterality: N/A;   TONSILLECTOMY  1988    OBJECTIVE:  Vitals:   11/29/21 1311  BP: 112/68  Pulse: (!) 51  Resp: 18  Temp: 98 F (36.7 C)  TempSrc: Oral  SpO2: 98%    General appearance: alert; no distress Oropharynx: moist Lungs: unlabored Abdomen: soft; non-distended; no significant abdominal tenderness; reports "cramping" feeling; no masses or organomegaly; no guarding or rebound tenderness Back: no CVA tenderness Extremities: no edema; symmetrical with no gross deformities Skin: warm; dry Neurologic: normal gait Psychological: alert and cooperative; normal mood and affect    Allergies  Allergen Reactions   Labetalol Itching   Aleve [Naproxen Sodium]     Patient has been instructed to avoid Ibuprofen b/c of his Creatinine level.   Ibuprofen Other (See Comments)    Patient has been instructed to avoid Ibuprofen b/c of his Creatinine level.   Furacin [Nitrofurazone] Rash  Past Medical History:  Diagnosis Date   Allergy    Anemia    Arthritis    foot   Congestive heart failure (HCC)    Constipation    uses mag citrate OTC if needed   GERD (gastroesophageal reflux disease)    Hiatal hernia    Hyperlipidemia     Hypertension    Kidney stone    Sleep apnea    wears cpap   Social History   Socioeconomic History   Marital status: Married    Spouse name: Not on file   Number of children: 4   Years of education: 12   Highest education level: Not on file  Occupational History   Not on file  Tobacco Use   Smoking status: Never   Smokeless tobacco: Never  Vaping Use   Vaping Use: Never used  Substance and Sexual Activity   Alcohol use: No   Drug use: No   Sexual activity: Not on file  Other Topics Concern   Not on file  Social History Narrative   Not on file   Social Determinants of Health   Financial Resource Strain: Not on file  Food Insecurity: Not on file  Transportation Needs: Not on file  Physical Activity: Not on file  Stress: Not on file  Social Connections: Not on file  Intimate Partner Violence: Not on file   Family History  Problem Relation Age of Onset   Colon cancer Mother    Stomach cancer Neg Hx    Colon polyps Neg Hx    Esophageal cancer Neg Hx    Rectal cancer Neg Hx       Vanessa Kick, MD 11/29/21 1332

## 2021-11-29 NOTE — Discharge Instructions (Addendum)

## 2021-12-13 ENCOUNTER — Other Ambulatory Visit: Payer: Self-pay | Admitting: Cardiology

## 2021-12-13 DIAGNOSIS — I10 Essential (primary) hypertension: Secondary | ICD-10-CM

## 2021-12-20 ENCOUNTER — Encounter: Payer: Self-pay | Admitting: Cardiology

## 2021-12-20 ENCOUNTER — Other Ambulatory Visit: Payer: Self-pay

## 2021-12-20 ENCOUNTER — Ambulatory Visit: Payer: Medicare Other | Admitting: Cardiology

## 2021-12-20 VITALS — BP 140/78 | HR 63 | Temp 97.8°F | Resp 16 | Ht 75.0 in | Wt 246.8 lb

## 2021-12-20 DIAGNOSIS — I25118 Atherosclerotic heart disease of native coronary artery with other forms of angina pectoris: Secondary | ICD-10-CM

## 2021-12-20 DIAGNOSIS — N1831 Chronic kidney disease, stage 3a: Secondary | ICD-10-CM

## 2021-12-20 DIAGNOSIS — R0609 Other forms of dyspnea: Secondary | ICD-10-CM

## 2021-12-20 DIAGNOSIS — R600 Localized edema: Secondary | ICD-10-CM

## 2021-12-20 DIAGNOSIS — I10 Essential (primary) hypertension: Secondary | ICD-10-CM

## 2021-12-20 MED ORDER — TORSEMIDE 20 MG PO TABS: 20.0000 mg | ORAL_TABLET | Freq: Every day | ORAL | 2 refills | Status: DC | PRN

## 2021-12-20 MED ORDER — ISOSORBIDE DINITRATE 30 MG PO TABS: 30.0000 mg | ORAL_TABLET | Freq: Two times a day (BID) | ORAL | 3 refills | Status: DC

## 2021-12-20 NOTE — Progress Notes (Signed)
Primary Physician/Referring:  Tisovec, Fransico Him, MD  Patient ID: Martin Stephenson, male    DOB: 1941/08/12, 80 y.o.   MRN: [id number]  Chief Complaint  Patient presents with   Coronary Artery Disease   Hypertension   Follow-up   HPI:    NISHAWN Stephenson  is a 62 y.o. 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  He has not had any recurrence of angina pectoris since angioplasty, also dyspnea has remained stable.  Leg edema is unchanged.  He request change from furosemide to torsemide.  He underwent cervical spine surgery on 12/14/2021 without periprocedural cardiac complications.   Past Medical History:  Diagnosis Date   Allergy    Anemia    Arthritis    foot   Congestive heart failure (HCC)    Constipation    uses mag citrate OTC if needed   GERD (gastroesophageal reflux disease)    Hiatal hernia    Hyperlipidemia    Hypertension    Kidney stone    Sleep apnea    wears cpap   Past Surgical History:  Procedure Laterality Date   CARDIAC CATHETERIZATION     COLONOSCOPY     CORONARY ATHERECTOMY N/A 03/14/2021   Procedure: CORONARY ATHERECTOMY;  Surgeon: [name], MD;  Location: [facility];  Service: Cardiovascular;  Laterality: N/A;   CORONARY BALLOON ANGIOPLASTY N/A 03/14/2021   Procedure: CORONARY BALLOON ANGIOPLASTY;  Surgeon: [name], MD;  Location: [facility];  Service: Cardiovascular;  Laterality: N/A;   CORONARY STENT INTERVENTION N/A 03/14/2021   Procedure: CORONARY STENT INTERVENTION;  Surgeon: [name], MD;  Location: [facility];  Service: Cardiovascular;  Laterality: N/A;   HAMMER TOE  SURGERY  1998   1st toe right foot   hydrocelectomy  1988   INTRAVASCULAR ULTRASOUND/IVUS N/A 03/14/2021   Procedure: Intravascular Ultrasound/IVUS;  Surgeon: [name], MD;  Location: [facility];  Service: Cardiovascular;  Laterality: N/A;   NECK SURGERY     POLYPECTOMY     RIGHT/LEFT HEART CATH AND CORONARY ANGIOGRAPHY N/A 03/14/2021   Procedure: RIGHT/LEFT HEART CATH AND CORONARY ANGIOGRAPHY;  Surgeon: [name], MD;  Location: [facility];  Service: Cardiovascular;  Laterality: N/A;   TONSILLECTOMY  1988   Family History  Problem Relation Age of Onset   Colon cancer Mother    Stomach cancer Neg Hx    Colon polyps Neg Hx    Esophageal cancer Neg Hx    Rectal cancer Neg Hx     Social History   Tobacco Use   Smoking status: Never   Smokeless tobacco: Never  Substance Use Topics   Alcohol use: No   Marital Status: Married  ROS  Review of Systems  Cardiovascular:  Positive for leg swelling (chronic). Negative for chest pain and dyspnea on exertion.  Gastrointestinal:  Negative for melena.  Objective  Blood pressure 140/78, pulse 63, temperature 97.8 F (36.6 C), temperature source Temporal, resp. rate 16, height 6' 3"  (1.905 m), weight 246 lb 12.8 oz (111.9 kg), SpO2 97 %.  Vitals with BMI 12/20/2021 12/20/2021 11/29/2021  Height - 6' 3"  -  Weight - 246 lbs 13 oz -  BMI - 58.85 -  Systolic 027 741 287  Diastolic 78 86 68  Pulse 63 66 51     Physical Exam Vitals reviewed.  Cardiovascular:     Rate and Rhythm: Normal rate and regular rhythm.     Pulses: Normal pulses and intact distal pulses.     Heart sounds: Normal heart sounds, S1 normal and S2 normal. No murmur heard.   No gallop.  Pulmonary:     Effort: Pulmonary effort is normal. No respiratory distress.     Breath sounds: Normal breath sounds. No wheezing, rhonchi or rales.  Abdominal:     General: Bowel sounds are normal.     Palpations: Abdomen is soft.  Musculoskeletal:     Right lower  leg: Edema present.     Left lower leg: Edema present.  Skin:    Capillary Refill: Capillary refill takes less than 2 seconds.   Laboratory examination:   Recent Labs    03/29/21 1044 04/21/21 1242 05/09/21 1349  NA 144 144 142  K 5.0 4.9 4.4  CL 107* 104 105  CO2 23 23 24   GLUCOSE 83 95 116*  BUN 22 16 22   CREATININE 1.35* 1.54* 1.55*  CALCIUM 9.3 9.2 9.0   CrCl cannot be calculated (Patient's most recent lab result is older than the maximum 21 days allowed.).  CMP Latest Ref Rng & Units 05/09/2021 04/21/2021 03/29/2021  Glucose 65 - 99 mg/dL 116(H) 95 83  BUN 8 - 27 mg/dL 22 16 22   Creatinine 0.76 - 1.27 mg/dL 1.55(H) 1.54(H) 1.35(H)  Sodium 134 - 144 mmol/L 142 144 144  Potassium 3.5 - 5.2 mmol/L 4.4 4.9 5.0  Chloride 96 - 106 mmol/L 105 104 107(H)  CO2 20 - 29 mmol/L 24 23 23   Calcium 8.6 - 10.2 mg/dL 9.0 9.2 9.3  Total Protein 6.0 - 8.3 g/dL - - -  Total Bilirubin 0.2 - 1.2 mg/dL - - -  Alkaline Phos 39 - 117 U/L - - -  AST 0 - 37 U/L - - -  ALT 0 - 53 U/L - - -   CBC Latest Ref Rng & Units 03/14/2021 03/14/2021 03/14/2021  WBC 3.4 - 10.8 x10E3/uL - - -  Hemoglobin 13.0 - 17.0 g/dL 10.9(L) 11.2(L) 11.2(L)  Hematocrit 39.0 - 52.0 % 32.0(L) 33.0(L) 33.0(L)  Platelets 150 - 450 x10E3/uL - - -   ProBNP    Component Value Date/Time   PROBNP 85 03/06/2021 1557   PROBNP 77.0 03/20/2017 0957    TSH No results for input(s): TSH in the last 8760 hours.  External labs:   Labs 10/17/2021:  Potassium 4.2, BUN 23, creatinine 1.590, EGFR 44 mL.  Iron studies normal.  Labs 12/26/2020:  BUN 24, serum glucose 106 mg, sodium 143, potassium 4.4, creatinine 1.47 EGFR 56 mL, CMP otherwise normal.  Uric acid normal at 4.1.  Hemoglobin 11.2/hematocrit 34.0, normal indicis.  Platelets 190.  Cholesterol, total 119.000 m 06/15/2020 HDL 36 MG/DL 06/15/2020 LDL 65.000 mg 06/15/2020 Triglycerides 88.000 06/15/2020   Hemoglobin 12.100 g/d 06/15/2020  Creatinine, Serum 1.300 mg/  06/15/2020 Potassium 4.100 MM 01/24/2017 ALT (SGPT) 14.000 uni 06/15/2020  Medications and allergies   Allergies  Allergen Reactions   Labetalol Itching   Aleve [Naproxen Sodium]     Patient has been instructed to avoid Ibuprofen b/c of his Creatinine level.   Ibuprofen Other (See Comments)    Patient has been instructed to avoid Ibuprofen b/c of his Creatinine level.   Furacin [Nitrofurazone]  Rash     Outpatient Medications Prior to Visit  Medication Sig Dispense Refill   allopurinol (ZYLOPRIM) 300 MG tablet Take 300 mg by mouth daily.      amLODipine (NORVASC) 5 MG tablet TAKE 1 TABLET BY MOUTH DAILY. 90 tablet 3   aspirin 81 MG tablet Take 81 mg by mouth daily.     Calcium Carb-Cholecalciferol (CALCIUM 600/VITAMIN D3 PO) Take 1 tablet by mouth daily.      ferrous sulfate 325 (65 FE) MG tablet Take 325 mg by mouth every Monday, Wednesday, and Friday.     losartan (COZAAR) 50 MG tablet Take 1 tablet (50 mg total) by mouth every evening. 90 tablet 3   Nebivolol HCl (BYSTOLIC) 20 MG TABS Take 1 tablet (20 mg total) by mouth daily. 90 tablet 3   pregabalin (LYRICA) 25 MG capsule Take by mouth.     pregabalin (LYRICA) 25 MG capsule Take 2 capsules by mouth at bedtime.     rosuvastatin (CRESTOR) 10 MG tablet Take 10 mg by mouth every Monday, Wednesday, and Friday.     sodium chloride (OCEAN) 0.65 % nasal spray Place into the nose as needed.     clopidogrel (PLAVIX) 75 MG tablet Take 1 tablet (75 mg total) by mouth daily. 90 tablet 3   furosemide (LASIX) 40 MG tablet Take 40 mg by mouth as needed.     metoprolol succinate (TOPROL-XL) 50 MG 24 hr tablet Take 1 tablet by mouth daily at 12 noon.     losartan (COZAAR) 25 MG tablet TAKE 1 TABLET BY MOUTH EVERY DAY IN THE EVENING 90 tablet 1   No facility-administered medications prior to visit.   Radiology:   Lower extremity venous insufficiency study 02/27/2017: 1. Normal bilateral lower extremity deep venous systems. No evidence of acute or  chronic DVT. 2. Unremarkable bilateral lower extremity saphenous venous systems. No evidence of valvular incompetence, reflux, or significant varicose vein disease.  CT angiogram chest 03/22/2017: Cardiovascular: Heart size upper normal. No pericardial effusion. Coronary artery calcification is noted.  Atherosclerotic calcification is noted in the wall of the thoracic aorta.  No filling defect in the opacified pulmonary arteries to suggest the presence of an acute pulmonary embolus.  MRI neck 02/22/2021:  Multilevel degenerative change in the cervical spine. 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  Cardiac Studies:   Heart Cath   [2007]: at [facility] hosp: Normal coronary arteries.  [facility]   [facility]]: Severe sleep apnea  Exercise Myoview stress test 02/13/2021: Exercise nuclear stress test was performed using Bruce protocol. Patient reached 7 METS, and 101% of age predicted maximum heart rate. Exercise capacity was low. Non-limiting chest pain reported. Heart rate and hemodynamic response were normal. Peak EKG demonstrated sinus tachycardia, 2-3 mm horizontal/down-sloping ST depressions in leads II, III, aVF, V4-V6, partially normalize 2 min into recovery. SPECT images showed medium sized, mild intensity, mildly reversible perfusion defect in basal-mid inferoseptal, inferior, inferolateral myocardium. Stress LVEF is calculated 44%, although visually appears normal.  Intermediate risk study.   Compared to treadmill stress test in 2018, no significant change in exercise capacity although there were no ST-T wave changes of ischemia.  Echocardiogram 02/17/2021: Left ventricle cavity is normal in size. Moderate concentric hypertrophy of the left ventricle. Mild global hypokinesis. LVEF 45-50%. Normal global wall motion. Diastolic function not  assessed due to severity of mitral  regurgitation.  Left atrial cavity is severely dilated. Trileaflet aortic valve.  Moderate (Grade II) aortic regurgitation. Moderate (Grade III) mitral regurgitation. Mild tricuspid regurgitation. No evidence of pulmonary hypertension. Compared to previous study in 2017, LVEF is mildly reduced from 50-55%.   Right and left heart catheterization 03/14/2021:  Normal right heart catheterization with preserved cardiac output and cardiac index. LV: Mild decrease in LVEF, global hypokinesis, EF from 45% to at most 50%.  No significant mitral regurgitation.  Normal EDP.  No pressure gradient across the aortic valve. RCA: Large vessel, with mild diffuse disease. Left main: Large vessel, distal left main has 20 to 30% stenosis.  Mild calcification is evident. LAD: Large-caliber vessel giving origin to moderate to large size D1 and D2.  After the origin of a large D1, there is a 70%, appears to be high-grade intracranial views, mildly calcified lesion noted.  Otherwise there is mild disease.  IVUS revealing 67% stenosis with a minimum luminal area of 5.81 mm.  Lesion left alone. Circumflex: Very large caliber vessel giving origin to 2 large marginals.  The OM1 has proximal 30 to 40% stenosis.  Ostium of the circumflex has a calcific 70 or high-grade stenosis. IVUS confirmed high-grade stenosis, 4.0 x 4.0 mm vessel, tightest segment 1.7 by 1.7.  Minimum lumen 2.35 mm.  Post PCI lumen area 14.58 mm.  A 4.0 x 8 mm resolute Onyx DES was deployed extending into the distal left main and left main stent was optimized with a 5.0 x 8 mm balloon.  IVUS confirmed excellent apposition of the stent. Balloon angioplasty to ostial LAD to open up the stent struts with a 3.5 x 8 mm balloon.  IVUS confirmed no significant stent distortion.   Recommendation: Patient hopefully will get benefit from the circumflex PCI.  Mid LAD has a moderate to high-grade stenosis, intermediate range, in view  of absence of ischemia in the anterior territory by nuclear stress test we will leave it alone.  Continue medical therapy.  150 mL contrast utilized.   Patient needs back surgery, this will be postponed for at least 3 months if not 6 months.   EKG:   EKG 06/16/2021: Sinus bradycardia at rate of 58 bpm, otherwise normal EKG.  No significant change from 03/28/2021.  EKG 01/30/2021: Sinus bradycardia at rate of 48 bpm, otherwise normal EKG.  Compared to 2018, heart rate was 52 bpm.  Assessment     ICD-10-CM   1. Coronary artery disease of native artery of native heart with stable angina pectoris (HCC)  I25.118 EKG 12-Lead    isosorbide dinitrate (ISORDIL) 30 MG tablet    2. Dyspnea on exertion  R06.09     3. Essential hypertension  I10 torsemide (DEMADEX) 20 MG tablet    isosorbide dinitrate (ISORDIL) 30 MG tablet    4. Stage 3a chronic kidney disease (HCC)  N18.31     5. Bilateral leg edema  R60.0 torsemide (DEMADEX) 20 MG tablet      Medications Discontinued During This Encounter  Medication Reason   losartan (COZAAR) 25 MG tablet Change in therapy   furosemide (LASIX) 40 MG tablet Change in therapy   metoprolol succinate (TOPROL-XL) 50 MG 24 hr tablet Duplicate   clopidogrel (PLAVIX) 75 MG tablet Completed Course    Meds ordered this encounter  Medications   torsemide (DEMADEX) 20 MG tablet    Sig: Take 1 tablet (20 mg total) by mouth daily as needed (Leg swelling).    Dispense:  90 tablet  Refill:  2    Discontinue Lasix   isosorbide dinitrate (ISORDIL) 30 MG tablet    Sig: Take 1 tablet (30 mg total) by mouth 2 (two) times daily.    Dispense:  180 tablet    Refill:  3   Orders Placed This Encounter  Procedures   EKG 12-Lead   Recommendations:   JAWAAN ADACHI is a 80 y.o. 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    Patient is presently doing well presents for 37-monthoffice visit, he has not had any recurrence of angina pectoris since angioplasty.  Dyspnea is remained stable.  He underwent neck surgery on 12/14/2021 without periprocedural cardiac complication.  No change in his physical exam, blood pressure is slightly elevated, he is on metoprolol and also Bystolic, will discontinue metoprolol.  I will add isosorbide dinitrate 30 mg p.o. twice daily.  He can discontinue Plavix as he has completed the course.  He will continue aspirin indefinitely.  With regard to chronic leg edema, will change from Lasix to torsemide.  Patient is aware of stage IIIa-B chronic kidney disease.  Labs reviewed, potassium levels are normal, renal function has remained stable.  With regard to hyperlipidemia, being managed by PCP, previously LDL was at goal at <70, continue present medications.  I will see him back in 6 months.     JAdrian Prows PA-C 12/20/2021, 2:21 PM Office: [phone]

## 2022-01-03 ENCOUNTER — Other Ambulatory Visit: Payer: Self-pay

## 2022-01-03 DIAGNOSIS — R600 Localized edema: Secondary | ICD-10-CM

## 2022-01-03 DIAGNOSIS — I10 Essential (primary) hypertension: Secondary | ICD-10-CM

## 2022-01-03 DIAGNOSIS — I25118 Atherosclerotic heart disease of native coronary artery with other forms of angina pectoris: Secondary | ICD-10-CM

## 2022-01-03 MED ORDER — ISOSORBIDE DINITRATE 30 MG PO TABS: 30.0000 mg | ORAL_TABLET | Freq: Two times a day (BID) | ORAL | 3 refills | Status: DC

## 2022-01-03 MED ORDER — TORSEMIDE 20 MG PO TABS: 20.0000 mg | ORAL_TABLET | Freq: Every day | ORAL | 2 refills | Status: DC | PRN

## 2022-02-25 ENCOUNTER — Other Ambulatory Visit: Payer: Self-pay | Admitting: Student

## 2022-03-17 NOTE — Progress Notes (Signed)
? ?Primary Physician/Referring:  Tisovec, Fransico Him, MD ? ?Patient ID: Martin Stephenson, male    DOB: 1941/05/13, 81 y.o.   MRN: [id number] ? ?Chief Complaint  ?Patient presents with  ? Leg Swelling  ? Shortness of Breath  ? Follow-up  ? ?HPI:   ? ?Martin Stephenson  is a 13 y.o. 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 ? ?Patient was last seen in our office by Dr. Einar Gip on 12/20/2021.  At last office visit discontinued metoprolol, continued Bystolic and added isosorbide dinitrate 30 mg twice daily.  Also stopped Plavix as he had completed the course and changed Lasix to torsemide given chronic leg edema.  Patient at that time was advised to follow-up in 6 months, however he presents for an urgent visit at his request with concerns of leg swelling.  Patient states since December he has noticed worsening leg swelling as well as some dyspnea on exertion.  Denies orthopnea, PND.  Notably patient has not taken torsemide in at least the last 2 weeks.  ? ?Denies chest pain, palpitations, syncope, near syncope. ? ?Past Medical History:  ?Diagnosis Date  ? Allergy   ? Anemia   ? Arthritis   ? foot  ? Congestive heart failure ([place])   ? Constipation   ? uses mag citrate OTC if needed  ? GERD (gastroesophageal reflux disease)   ? Hiatal hernia   ? Hyperlipidemia   ? Hypertension   ? Kidney stone   ? Sleep apnea   ? wears cpap  ? ?Past Surgical History:  ?Procedure Laterality Date  ? CARDIAC CATHETERIZATION    ? CERVICAL DISCECTOMY  11/2021  ? COLONOSCOPY    ? CORONARY ATHERECTOMY N/A 03/14/2021  ? Procedure: CORONARY ATHERECTOMY;  Surgeon: [name], MD;  Location: [facility];  Service: Cardiovascular;  Laterality: N/A;  ? CORONARY BALLOON ANGIOPLASTY N/A 03/14/2021  ? Procedure: CORONARY BALLOON ANGIOPLASTY;  Surgeon: [name], MD;  Location: [facility];  Service: Cardiovascular;  Laterality: N/A;  ? CORONARY STENT INTERVENTION N/A 03/14/2021  ? Procedure: CORONARY STENT INTERVENTION;  Surgeon: [name], MD;  Location: [facility];  Service: Cardiovascular;  Laterality: N/A;  ? [place]  ? 1st toe right foot  ? hydrocelectomy  1988  ? INTRAVASCULAR ULTRASOUND/IVUS N/A 03/14/2021  ? Procedure: Intravascular Ultrasound/IVUS;  Surgeon: [name], MD;  Location: [facility];  Service: Cardiovascular;  Laterality: N/A;  ? NECK SURGERY    ? POLYPECTOMY    ? RIGHT/LEFT HEART CATH AND CORONARY ANGIOGRAPHY N/A 03/14/2021  ? Procedure: RIGHT/LEFT HEART CATH AND CORONARY ANGIOGRAPHY;  Surgeon: [name], MD;  Location: [facility];  Service: Cardiovascular;  Laterality: N/A;  ? TONSILLECTOMY  1988  ? ?Family History  ?Problem Relation Age of Onset  ? Colon cancer Mother   ? Stomach cancer Neg Hx   ? Colon polyps Neg Hx   ? Esophageal cancer Neg Hx   ? Rectal cancer Neg Hx   ?  ?Social History  ? ?Tobacco Use  ? Smoking status: Never  ? Smokeless tobacco: Never  ?Substance Use Topics  ? Alcohol use: No  ? ?Marital  Status: Married  ?ROS  ?Review of Systems  ?Cardiovascular:  Positive for dyspnea on exertion (intermittent) and leg swelling (chronic, worse than baseline). Negative for chest pain.  ?Gastrointestinal:  Negative for melena.  ?Objective  ?Blood pressure (!) 153/70, pulse (!) 48, temperature 98.6 ?F (37 ?C), temperature source Temporal, resp. rate 17, height _0  (1.905 m), weight 255 lb 9.6 oz (115.9 kg), SpO2 98 %.  ? ?  03/19/2022  ?  8:42 AM 03/19/2022  ?  8:32 AM 12/20/2021  ?  1:45 PM  ?Vitals with BMI  ?Height  _1    ?Weight  255 lbs 10 oz   ?BMI  31.95   ?Systolic 201 007 121  ?Diastolic 70 79 78  ?Pulse 48 57 63  ?  ?  Physical Exam ?Vitals reviewed.  ?Constitutional:   ?   Appearance: He is obese.  ?Cardiovascular:  ?   Rate and Rhythm: Regular rhythm. Bradycardia present.  ?   Pulses: Normal pulses and intact distal pulses.  ?   Heart sounds: Normal heart sounds, S1 normal and S2 normal. No murmur heard. ?  No gallop.  ?Pulmonary:  ?   Effort: Pulmonary effort is normal. No respiratory distress.  ?   Breath sounds: Normal breath sounds. No wheezing, rhonchi or rales.  ?Musculoskeletal:  ?   Right lower leg: Edema present.  ?   Left lower leg: Edema present.  ?Neurological:  ?   Mental Status: He is alert.  ? ?Laboratory examination:  ? ?Recent Labs  ?  03/29/21 ?1044 04/21/21 ?1242 05/09/21 ?1349  ?NA 144 144 142  ?K 5.0 4.9 4.4  ?CL 107* 104 105  ?CO2 _2 ?GLUCOSE 83 95 116*  ?BUN _3 ?CREATININE 1.35* 1.54* 1.55*  ?CALCIUM 9.3 9.2 9.0  ? ?CrCl cannot be calculated (Patient's most recent lab result is older than the maximum 21 days allowed.).  ? ?  Latest Ref Rng & Units 05/09/2021  ?  1:49 PM 04/21/2021  ? 12:42 PM 03/29/2021  ? 10:44 AM  ?CMP  ?Glucose 65 - 99 mg/dL 116   95   83    ?BUN 8 - 27 mg/dL _4 ?Creatinine 0.76 - 1.27 mg/dL 1.55   1.54   1.35    ?Sodium 134 - 144 mmol/L 142   144   144    ?Potassium 3.5 - 5.2 mmol/L 4.4   4.9   5.0    ?Chloride 96 - 106 mmol/L 105   104   107    ?CO2 20 - 29 mmol/L _5 ?Calcium 8.6 - 10.2 mg/dL 9.0   9.2   9.3    ? ? ?  Latest Ref Rng & Units 03/14/2021  ?  8:17 AM 03/14/2021  ?  8:10 AM 03/06/2021  ?  3:57 PM  ?CBC  ?WBC 3.4 - 10.8 x10E3/uL   4.7    ?Hemoglobin 13.0 - 17.0 g/dL 10.9   11.2    ? 11.2   12.2    ?Hematocrit 39.0 - 52.0 % 32.0   33.0    ? 33.0   36.0    ?Platelets 150 - 450 x10E3/uL   186    ? ?ProBNP ?   ?Component Value Date/Time  ? PROBNP 85 03/06/2021 1557  ? PROBNP 77.0 03/20/2017 0957  ?  ?TSH ?No results for input(s): TSH in  the last 8760 hours. ? ?External labs:  ? ?Labs 10/17/2021: ? ?Potassium 4.2, BUN 23, creatinine 1.590, EGFR  44 mL.  Iron studies normal. ? ?Labs 12/26/2020: ? ?BUN 24, serum glucose 106 mg, sodium 143, potassium 4.4, creatinine 1.47 EGFR 56 mL, CMP otherwise normal.  Uric acid normal at 4.1. ? ?Hemoglobin 11.2/hematocrit 34.0, normal indicis.  Platelets 190. ? ?Cholesterol, total 119.000 m 06/15/2020 ?HDL 36 MG/DL 06/15/2020 ?LDL 65.000 mg 06/15/2020 ?Triglycerides 88.000 06/15/2020 ? ? ?Hemoglobin 12.100 g/d 06/15/2020 ? ?Creatinine, Serum 1.300 mg/ 06/15/2020 ?Potassium 4.100 MM 01/24/2017 ?ALT (SGPT) 14.000 uni 06/15/2020 ?Allergies  ? ?Allergies  ?Allergen Reactions  ? Labetalol Itching  ? Aleve [Naproxen Sodium]   ?  Patient has been instructed to avoid Ibuprofen b/c of his Creatinine level.  ? Ibuprofen Other (See Comments)  ?  Patient has been instructed to avoid Ibuprofen b/c of his Creatinine level.  ? Furacin [Nitrofurazone] Rash  ?  ?Medications Prior to Visit:  ? ?Outpatient Medications Prior to Visit  ?Medication Sig Dispense Refill  ? allopurinol (ZYLOPRIM) 300 MG tablet Take 300 mg by mouth daily.     ? amLODipine (NORVASC) 5 MG tablet TAKE 1 TABLET BY MOUTH DAILY. 90 tablet 3  ? aspirin 81 MG tablet Take 81 mg by mouth daily.    ? Calcium Carb-Cholecalciferol (CALCIUM 600/VITAMIN D3 PO) Take 1 tablet by mouth daily.     ? ferrous sulfate 325 (65 FE) MG tablet Take 325 mg by mouth every Monday, Wednesday, and Friday.    ? isosorbide dinitrate (ISORDIL) 30 MG tablet Take 1 tablet (30 mg total) by mouth 2 (two) times daily. (Patient taking differently: Take 30 mg by mouth every other day. Monday, Wednesday, and friday) 180 tablet 3  ? losartan (COZAAR) 50 MG tablet Take 1 tablet (50 mg total) by mouth every evening. 90 tablet 3  ? pregabalin (LYRICA) 25 MG capsule Take 25 mg by mouth daily.    ? rosuvastatin (CRESTOR) 10 MG tablet Take 10 mg by mouth every Monday, Wednesday, and Friday.    ? sodium chloride (OCEAN) 0.65 % nasal spray Place into the nose as needed.    ? torsemide (DEMADEX) 20 MG tablet Take 1 tablet  (20 mg total) by mouth daily as needed (Leg swelling). 90 tablet 2  ? Nebivolol HCl (BYSTOLIC) 20 MG TABS Take 1 tablet (20 mg total) by mouth daily. 90 tablet 3  ? pregabalin (LYRICA) 25 MG capsule Take 2 caps

## 2022-03-18 ENCOUNTER — Other Ambulatory Visit: Payer: Self-pay | Admitting: Student

## 2022-03-19 ENCOUNTER — Ambulatory Visit: Payer: Medicare Other | Admitting: Student

## 2022-03-19 ENCOUNTER — Other Ambulatory Visit: Payer: Self-pay

## 2022-03-19 ENCOUNTER — Encounter: Payer: Self-pay | Admitting: Student

## 2022-03-19 VITALS — BP 153/70 | HR 48 | Temp 98.6°F | Resp 17 | Ht 75.0 in | Wt 255.6 lb

## 2022-03-19 DIAGNOSIS — I25118 Atherosclerotic heart disease of native coronary artery with other forms of angina pectoris: Secondary | ICD-10-CM

## 2022-03-19 DIAGNOSIS — R600 Localized edema: Secondary | ICD-10-CM

## 2022-03-19 DIAGNOSIS — I10 Essential (primary) hypertension: Secondary | ICD-10-CM

## 2022-03-19 MED ORDER — NEBIVOLOL HCL 5 MG PO TABS: 5.0000 mg | ORAL_TABLET | Freq: Every day | ORAL | 3 refills | Status: DC

## 2022-03-19 NOTE — Patient Instructions (Signed)
Coronary Artery Disease, Male ?Coronary artery disease (CAD) is a condition in which the arteries that lead to the heart (coronary arteries) become narrow or blocked. The narrowing or blockage can lead to decreased blood flow to the heart. Prolonged reduced blood flow can cause a heart attack (myocardial infarction or MI). This condition may also be called coronary heart disease. ?Because CAD is the leading cause of death in men, it is important to understand what causes this condition and how it is treated. ?What are the causes? ?CAD is most often caused by atherosclerosis. This is the buildup of fat and cholesterol (plaque) on the inside of the arteries. Over time, the plaque may narrow or block the artery, reducing blood flow to the heart. Plaque can also become weak and break off within a coronary artery and cause a sudden blockage. Other less common causes of CAD include: ?A blood clot or a piece of a blood clot or other substance that blocks the flow of blood in a coronary artery (embolism). ?A tearing of the artery (spontaneous coronary artery dissection). ?An enlargement of an artery (aneurysm). ?Inflammation (vasculitis) in the artery wall. ?What increases the risk? ?The following factors may make you more likely to develop this condition: ?Age. Men over age 40 are at a greater risk of CAD. ?Family history of CAD. ?Gender. Men often develop CAD earlier in life than women. ?High blood pressure (hypertension). ?Diabetes. ?High cholesterol levels. ?Tobacco use. ?Excessive alcohol use. ?Lack of exercise. ?A diet high in saturated and trans fats, such as fried food and processed meat. ?Other possible risk factors include: ?High stress levels. ?Depression. ?Obesity. ?Sleep apnea. ?What are the signs or symptoms? ?Many people do not have any symptoms during the early stages of CAD. As the condition progresses, symptoms may include: ?Chest pain (angina). The pain can: ?Feel like crushing or squeezing, or like a  tightness, pressure, fullness, or heaviness in the chest. ?Last more than a few minutes or can stop and recur. The pain tends to get worse with exercise or stress and to fade with rest. ?Pain in the arms, neck, jaw, ear, or back. ?Unexplained heartburn or indigestion. ?Shortness of breath. ?Nausea or vomiting. ?Sudden light-headedness. ?Sudden cold sweats. ?Fluttering or fast heartbeat (palpitations). ?How is this diagnosed? ?This condition is diagnosed based on: ?Your family and medical history. ?A physical exam. ?Tests, including: ?A test to check the electrical signals in your heart (electrocardiogram). ?Exercise stress test. This looks for signs of blockage when the heart is stressed with exercise, such as running on a treadmill. ?Pharmacologic stress test. This test looks for signs of blockage when the heart is being stressed with a medicine. ?Blood tests. ?Coronary angiogram. This is a procedure to look at the coronary arteries to see if there is any blockage. During this test, a dye is injected into your arteries so they appear on an X-ray. ?Coronary artery CT scan. This CT scan helps detect calcium deposits in your coronary arteries. Calcium deposits are an indicator of CAD. ?A test that uses sound waves to take a picture of your heart (echocardiogram). ?Chest X-ray. ?How is this treated? ?This condition may be treated by: ?Healthy lifestyle changes to reduce risk factors. ?Medicines such as: ?Antiplatelet medicines and blood-thinning medicines, such as aspirin. These help to prevent blood clots. ?Nitroglycerin. ?Blood pressure medicines. ?Cholesterol-lowering medicine. ?Coronary angioplasty and stenting. During this procedure, a thin, flexible tube is inserted through a blood vessel and into a blocked artery. A balloon or similar device on  the end of the tube is inflated to open up the artery. In some cases, a small, mesh tube (stent) is inserted into the artery to keep it open. ?Coronary artery bypass  surgery. During this surgery, veins or arteries from other parts of the body are used to create a bypass around the blockage and allow blood to reach your heart. ?Follow these instructions at home: ?Medicines ?Take over-the-counter and prescription medicines only as told by your health care provider. ?Do not take the following medicines unless your health care provider approves: ?NSAIDs, such as ibuprofen, naproxen, or celecoxib. ?Vitamin supplements that contain vitamin A, vitamin E, or both. ?Lifestyle ?Follow an exercise program approved by your health care provider. Aim for 150 minutes of moderate exercise or 75 minutes of vigorous exercise each week. ?Maintain a healthy weight or lose weight as approved by your health care provider. ?Learn to manage stress or try to limit your stress. Ask your health care provider for suggestions if you need help. ?Get screened for depression and seek treatment, if needed. ?Do not use any products that contain nicotine or tobacco, such as cigarettes, e-cigarettes, and chewing tobacco. If you need help quitting, ask your health care provider. ?Do not use illegal drugs. ?Eating and drinking ? ?Follow a heart-healthy diet. A dietitian can help educate you about healthy food options and changes. In general, eat plenty of fruits and vegetables, lean meats, and whole grains. ?Avoid foods high in: ?Sugar. ?Salt (sodium). ?Saturated fat, such as processed or fatty meat. ?Trans fat, such as fried foods. ?Use healthy cooking methods such as roasting, grilling, broiling, baking, poaching, steaming, or stir-frying. ?Do not drink alcohol if your health care provider tells you not to drink. ?If you drink alcohol: ?Limit how much you have to 0-2 drinks per day. ?Be aware of how much alcohol is in your drink. In the [place] one drink equals one 12 oz bottle of beer (355 mL), one 5 oz glass of wine (148 mL), or one 1? oz glass of hard liquor (44 mL). ?General instructions ?Manage any other health  conditions, such as hypertension and diabetes. These conditions affect your heart. ?Your health care provider may ask you to monitor your blood pressure. Ideally, your blood pressure should be below 130/80. ?Keep all follow-up visits as told by your health care provider. This is important. ?Get help right away if: ?You have pain in your chest, neck, ear, arm, jaw, stomach, or back that: ?Lasts more than a few minutes. ?Is recurring. ?Is not relieved by taking medicine under your tongue (sublingual nitroglycerin). ?You have profuse sweating without cause. ?You have unexplained: ?Heartburn or indigestion. ?Shortness of breath or difficulty breathing. ?Fluttering or fast heartbeat (palpitations). ?Nausea or vomiting. ?Fatigue. ?Feelings of nervousness or anxiety. ?Weakness. ?Diarrhea. ?You have sudden light-headedness or dizziness. ?You faint. ?You feel like hurting yourself or think about taking your own life. ?These symptoms may represent a serious problem that is an emergency. Do not wait to see if the symptoms will go away. Get medical help right away. Call your local emergency services (911 in the [place] Do not drive yourself to the hospital. ?Summary ?Coronary artery disease (CAD) is a condition in which the arteries that lead to the heart (coronary arteries) become narrow or blocked. The narrowing or blockage can lead to a heart attack. ?Many people do not have any symptoms during the early stages of CAD. ?CAD can be treated with lifestyle changes, medicines, surgery, or a combination of these treatments. ?This information  is not intended to replace advice given to you by your health care provider. Make sure you discuss any questions you have with your health care provider. ?Document Revised: 08/29/2018 Document Reviewed: 08/19/2018 ?Elsevier Patient Education ? [place]. ? ?

## 2022-03-22 ENCOUNTER — Ambulatory Visit: Payer: Medicare Other

## 2022-03-22 DIAGNOSIS — R600 Localized edema: Secondary | ICD-10-CM

## 2022-03-22 DIAGNOSIS — I25118 Atherosclerotic heart disease of native coronary artery with other forms of angina pectoris: Secondary | ICD-10-CM

## 2022-03-27 ENCOUNTER — Telehealth: Payer: Self-pay | Admitting: Student

## 2022-03-27 NOTE — Telephone Encounter (Signed)
Patient called to verify he needed blood work done and when. He also wanted to inform you that the medication you had put him on seem to be working as he is feeling improvement. ?

## 2022-03-27 NOTE — Telephone Encounter (Signed)
Glad to hear he is feeling better. Labs need to be done this week some time ?

## 2022-04-06 ENCOUNTER — Other Ambulatory Visit: Payer: Self-pay | Admitting: Student

## 2022-04-10 LAB — BASIC METABOLIC PANEL
BUN/Creatinine Ratio: 16 (ref 10–24)
BUN: 26 mg/dL (ref 8–27)
CO2: 22 mmol/L (ref 20–29)
Calcium: 9.1 mg/dL (ref 8.6–10.2)
Chloride: 108 mmol/L — ABNORMAL HIGH (ref 96–106)
Creatinine, Ser: 1.66 mg/dL — ABNORMAL HIGH (ref 0.76–1.27)
Glucose: 108 mg/dL — ABNORMAL HIGH (ref 70–99)
Potassium: 4.5 mmol/L (ref 3.5–5.2)
Sodium: 146 mmol/L — ABNORMAL HIGH (ref 134–144)
eGFR: 42 mL/min/{1.73_m2} — ABNORMAL LOW (ref >59)

## 2022-04-10 LAB — BRAIN NATRIURETIC PEPTIDE: BNP: 21.3 pg/mL (ref 0.0–100.0)

## 2022-04-10 LAB — MAGNESIUM: Magnesium: 1.7 mg/dL (ref 1.6–2.3)

## 2022-04-13 NOTE — Progress Notes (Signed)
Called pt, wife answered gave her the information. Pt wife understood.

## 2022-04-16 ENCOUNTER — Other Ambulatory Visit: Payer: Self-pay | Admitting: Student

## 2022-04-16 DIAGNOSIS — I10 Essential (primary) hypertension: Secondary | ICD-10-CM

## 2022-04-16 NOTE — Progress Notes (Signed)
? ?Primary Physician/Referring:  Tisovec, Fransico Him, MD ? ?Patient ID: Martin Stephenson, male    DOB: August 11, 1941, 81 y.o.   MRN: [id number] ? ?Chief Complaint  ?Patient presents with  ? Shortness of Breath  ?  4 WEEKS  ? Results  ? ?HPI:   ? ?Martin Stephenson  is a 5 y.o. 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 ? ?Patient presents for 4-week follow-up.  He was last seen 03/19/2022 with concerns of leg swelling for an urgent visit.  Advised patient to take torsemide daily for 5 days.  Following 5 days of torsemide patient's BNP was normal, however his creatinine had trended up slightly.  Advised patient to switch back to taking torsemide as needed and have repeat labs done.  Repeat BMP is still pending.  Also last visit ordered repeat echocardiogram which showed normalization of LVEF to 56% from 45-50% in 01/2021 as well as improvement of MR from grade 3 to grade 1.  EKG at last office visit noted marked bradycardia, therefore Bystolic was reduced from 20 mg to 5 mg daily.  ? ?Patient's heart rate has improved, he is no longer bradycardic.  Blood pressure is now well controlled.  He is not taking torsemide as needed.  Patient's leg edema is improved compared to last visit, however he continues to have bilateral lower extremity edema.  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  Patient also had a single episode of chest pain, left arm pain, and left shoulder pain after working in the yard for an extended period of time.  This  pain was at rest and worse with movements of his arms, this has not occurred since last week, this was a single episode. ? ?Past Medical History:  ?Diagnosis Date  ? Allergy   ? Anemia   ? Arthritis   ? foot  ? Congestive heart failure ([place])   ? Constipation   ? uses mag citrate OTC if needed  ? GERD (gastroesophageal reflux disease)   ? Hiatal hernia   ? Hyperlipidemia   ? Hypertension   ? Kidney stone   ? Sleep apnea   ? wears cpap  ? ?Past Surgical History:  ?Procedure Laterality Date  ? CARDIAC CATHETERIZATION    ? CERVICAL DISCECTOMY  11/2021  ? COLONOSCOPY    ? CORONARY ATHERECTOMY N/A 03/14/2021  ? Procedure: CORONARY ATHERECTOMY;  Surgeon: [name], MD;  Location: [place] [facility];  Service: Cardiovascular;  Laterality: N/A;  ? CORONARY BALLOON ANGIOPLASTY N/A 03/14/2021  ? Procedure: CORONARY BALLOON ANGIOPLASTY;  Surgeon: [name], MD;  Location: [place] [facility];  Service: Cardiovascular;  Laterality: N/A;  ? CORONARY STENT INTERVENTION N/A 03/14/2021  ? Procedure: CORONARY STENT INTERVENTION;  Surgeon: [name], MD;  Location: [place] [facility];  Service: Cardiovascular;  Laterality: N/A;  ? Ness  ? 1st toe right foot  ? hydrocelectomy  1988  ? INTRAVASCULAR ULTRASOUND/IVUS N/A 03/14/2021  ? Procedure: Intravascular Ultrasound/IVUS;  Surgeon: [name], MD;  Location: [place] [facility];  Service: Cardiovascular;  Laterality: N/A;  ? NECK SURGERY    ? POLYPECTOMY    ? RIGHT/LEFT HEART CATH AND CORONARY ANGIOGRAPHY N/A 03/14/2021  ? Procedure: RIGHT/LEFT HEART CATH AND CORONARY ANGIOGRAPHY;  Surgeon: [name], MD;  Location: [facility];  Service: Cardiovascular;  Laterality: N/A;  ? TONSILLECTOMY  1988  ? ?Family History  ?Problem Relation Age of Onset  ? Colon cancer Mother   ? Stomach cancer Neg Hx   ? Colon polyps Neg Hx   ? Esophageal cancer Neg Hx   ? Rectal cancer Neg Hx   ?  ?Social History  ? ?Tobacco Use  ? Smoking status: Never  ? Smokeless tobacco:  Never  ?Substance Use Topics  ? Alcohol use: No  ? ?Marital Status: Married  ?ROS  ?Review of Systems  ?Cardiovascular:  Positive for dyspnea on exertion (intermittent) and leg swelling (chronic). Negative for chest pain (no recurrence).  ?Gastrointestinal:  Negative for melena.  ? ?Objective  ?Blood pressure 133/70, pulse 61, temperature 97.9 ?F (36.6 ?C), temperature source Temporal, resp. rate 17, height 6' 3"  (1.905 m), weight 248 lb 12.8 oz (112.9 kg), SpO2 96 %.  ? ?  04/17/2022  ?  9:06 AM 03/19/2022  ?  8:42 AM 03/19/2022  ?  8:32 AM  ?Vitals with BMI  ?Height 6' 3"   6' 3"   ?Weight 248 lbs 13 oz  255 lbs 10 oz  ?BMI 31.1  31.95  ?Systolic 101 751 025  ?Diastolic 70 70 79  ?Pulse 61 48 57  ?  ? Physical Exam ?Vitals reviewed.  ?Constitutional:   ?   Appearance: He is obese.  ?Cardiovascular:  ?   Rate and Rhythm: Normal rate and regular rhythm.  ?   Pulses: Normal pulses and intact distal pulses.  ?   Heart sounds: Normal heart sounds, S1 normal and S2 normal. No murmur heard. ?  No gallop.  ?Pulmonary:  ?   Effort: Pulmonary effort is normal. No respiratory distress.  ?   Breath sounds: Normal breath sounds. No wheezing, rhonchi or rales.  ?Musculoskeletal:  ?   Right lower leg: Edema present.  ?   Left lower leg: Edema present.  ?Neurological:  ?   Mental Status: He is alert.  ? ?Laboratory examination:  ? ?Recent Labs  ?  04/21/21 ?1242 05/09/21 ?1349 04/09/22 ?1249  ?NA 144 142 146*  ?K 4.9 4.4 4.5  ?CL 104 105 108*  ?CO2 23 24 22   ?GLUCOSE 95 116* 108*  ?BUN 16 22 26   ?CREATININE 1.54* 1.55* 1.66*  ?CALCIUM 9.2 9.0 9.1  ? ?estimated creatinine clearance is 48.9 mL/min (A) (by C-G formula based on SCr of 1.66 mg/dL (H)).  ? ?  Latest Ref Rng & Units 04/09/2022  ? 12:49 PM 05/09/2021  ?  1:49 PM 04/21/2021  ? 12:42 PM  ?CMP  ?Glucose 70 - 99 mg/dL 108   116   95    ?BUN 8 - 27 mg/dL 26   22   16     ?Creatinine 0.76 - 1.27 mg/dL 1.66   1.55   1.54    ?Sodium 134 - 144 mmol/L 146   142   144    ?Potassium 3.5 -  5.2 mmol/L 4.5   4.4   4.9    ?Chloride 96 - 106 mmol/L 108   105   104    ?CO2 20 - 29 mmol/L 22   24   23     ?Calcium 8.6 - 10.2  mg/dL 9.1   9.0   9.2    ? ? ?  Latest Ref Rng & Units 03/14/2021  ?  8:17 AM 03/14/2021  ?  8:10 AM 03/06/2021  ?  3:57 PM  ?CBC  ?WBC 3.4 - 10.8 x10E3/uL   4.7    ?Hemoglobin 13.0 - 17.0 g/dL 10.9   11.2    ? 11.2   12.2    ?Hematocrit 39.0 - 52.0 % 32.0   33.0    ? 33.0   36.0    ?Platelets 150 - 450 x10E3/uL   186    ? ?ProBNP ?   ?Component Value Date/Time  ? PROBNP 85 03/06/2021 1557  ? PROBNP 77.0 03/20/2017 0957  ?  ?TSH ?No results for input(s): TSH in the last 8760 hours. ? ?External labs:  ? ?Labs 10/17/2021: ? ?Potassium 4.2, BUN 23, creatinine 1.590, EGFR 44 mL.  Iron studies normal. ? ?Labs 12/26/2020: ? ?BUN 24, serum glucose 106 mg, sodium 143, potassium 4.4, creatinine 1.47 EGFR 56 mL, CMP otherwise normal.  Uric acid normal at 4.1. ? ?Hemoglobin 11.2/hematocrit 34.0, normal indicis.  Platelets 190. ? ?Cholesterol, total 119.000 m 06/15/2020 ?HDL 36 MG/DL 06/15/2020 ?LDL 65.000 mg 06/15/2020 ?Triglycerides 88.000 06/15/2020 ? ? ?Hemoglobin 12.100 g/d 06/15/2020 ? ?Creatinine, Serum 1.300 mg/ 06/15/2020 ?Potassium 4.100 MM 01/24/2017 ?ALT (SGPT) 14.000 uni 06/15/2020 ?Allergies  ? ?Allergies  ?Allergen Reactions  ? Labetalol Itching  ? Aleve [Naproxen Sodium]   ?  Patient has been instructed to avoid Ibuprofen b/c of his Creatinine level.  ? Ibuprofen Other (See Comments)  ?  Patient has been instructed to avoid Ibuprofen b/c of his Creatinine level.  ? Furacin [Nitrofurazone] Rash  ?  ?Medications Prior to Visit:  ? ?Outpatient Medications Prior to Visit  ?Medication Sig Dispense Refill  ? allopurinol (ZYLOPRIM) 300 MG tablet Take 300 mg by mouth daily.     ? amLODipine (NORVASC) 5 MG tablet TAKE 1 TABLET BY MOUTH DAILY. 90 tablet 3  ? aspirin 81 MG tablet Take 81 mg by mouth daily.    ? Calcium Carb-Cholecalciferol (CALCIUM 600/VITAMIN D3 PO) Take 1 tablet by mouth daily.     ?  ferrous sulfate 325 (65 FE) MG tablet Take 325 mg by mouth every Monday, Wednesday, and Friday.    ? isosorbide dinitrate (ISORDIL) 30 MG tablet Take 1 tablet (30 mg total) by mouth 2 (two) times daily. 180 tab

## 2022-04-17 ENCOUNTER — Ambulatory Visit: Payer: Medicare Other | Admitting: Student

## 2022-04-17 ENCOUNTER — Other Ambulatory Visit: Payer: Self-pay | Admitting: Student

## 2022-04-17 ENCOUNTER — Encounter: Payer: Self-pay | Admitting: Student

## 2022-04-17 VITALS — BP 133/70 | HR 61 | Temp 97.9°F | Resp 17 | Ht 75.0 in | Wt 248.8 lb

## 2022-04-17 DIAGNOSIS — I25118 Atherosclerotic heart disease of native coronary artery with other forms of angina pectoris: Secondary | ICD-10-CM

## 2022-04-17 DIAGNOSIS — R001 Bradycardia, unspecified: Secondary | ICD-10-CM

## 2022-04-17 DIAGNOSIS — I10 Essential (primary) hypertension: Secondary | ICD-10-CM

## 2022-04-18 ENCOUNTER — Other Ambulatory Visit: Payer: Self-pay | Admitting: Student

## 2022-04-18 LAB — BASIC METABOLIC PANEL
BUN/Creatinine Ratio: 15 (ref 10–24)
BUN: 27 mg/dL (ref 8–27)
CO2: 24 mmol/L (ref 20–29)
Calcium: 9.7 mg/dL (ref 8.6–10.2)
Chloride: 107 mmol/L — ABNORMAL HIGH (ref 96–106)
Creatinine, Ser: 1.75 mg/dL — ABNORMAL HIGH (ref 0.76–1.27)
Glucose: 97 mg/dL (ref 70–99)
Potassium: 4.5 mmol/L (ref 3.5–5.2)
Sodium: 147 mmol/L — ABNORMAL HIGH (ref 134–144)
eGFR: 39 mL/min/{1.73_m2} — ABNORMAL LOW (ref >59)

## 2022-04-19 NOTE — Progress Notes (Signed)
Called and spoke with patient regarding his recnt lab results. Patient stated that he has an appointment with Dr . Osborne Casco on May 10th and that if you want him to move it up to a closer date, you would have to call to let them know why. Please advise.

## 2022-04-19 NOTE — Progress Notes (Signed)
May 10th is actually fairly soon he can keep that appt. Please make sure PCP has access to these lab results

## 2022-04-22 ENCOUNTER — Other Ambulatory Visit: Payer: Self-pay | Admitting: Student

## 2022-05-10 NOTE — Progress Notes (Signed)
Labs 05/02/2022:  Serum glucose 99 mg, BUN 26, creatinine 1.6, EGFR 50 mL, potassium 4.8.  LFTs normal.  Labs 01/08/2022:  Hb 11.2/HCT 34.3, platelets 189.  Cholesterol 134, triglycerides 77, HDL 35, LDL 84. Non-HDL cholesterol 99.

## 2022-06-01 ENCOUNTER — Ambulatory Visit: Payer: Medicare Other | Admitting: Podiatry

## 2022-06-01 ENCOUNTER — Encounter: Payer: Self-pay | Admitting: Podiatry

## 2022-06-01 DIAGNOSIS — D689 Coagulation defect, unspecified: Secondary | ICD-10-CM

## 2022-06-01 DIAGNOSIS — L84 Corns and callosities: Secondary | ICD-10-CM

## 2022-06-01 NOTE — Progress Notes (Signed)
Subjective:   Patient ID: Martin Stephenson, male   DOB: 81 y.o.   MRN: [id number]   HPI Patient presents with chronic lesion underneath the right foot and on the right third digit that are both very painful and hard for him to walk with   ROS      Objective:  Physical Exam  Neuro vascular status unchanged thick keratotic lesion submetatarsal right third digit right both painful     Assessment:  Chronic lesion formation right with pain     Plan:  H&P reviewed condition and went ahead today debrided painful lesions right with no iatrogenic bleeding applied cushion and will be done as needed

## 2022-06-14 ENCOUNTER — Other Ambulatory Visit: Payer: Self-pay | Admitting: Cardiology

## 2022-06-14 DIAGNOSIS — I10 Essential (primary) hypertension: Secondary | ICD-10-CM

## 2022-06-20 ENCOUNTER — Encounter: Payer: Self-pay | Admitting: Cardiology

## 2022-06-20 ENCOUNTER — Ambulatory Visit: Payer: Medicare Other | Admitting: Cardiology

## 2022-06-20 VITALS — BP 136/80 | HR 64 | Temp 98.1°F | Resp 16 | Ht 75.0 in | Wt 245.2 lb

## 2022-06-20 DIAGNOSIS — I10 Essential (primary) hypertension: Secondary | ICD-10-CM

## 2022-06-20 DIAGNOSIS — I25118 Atherosclerotic heart disease of native coronary artery with other forms of angina pectoris: Secondary | ICD-10-CM

## 2022-06-20 DIAGNOSIS — N1831 Chronic kidney disease, stage 3a: Secondary | ICD-10-CM

## 2022-06-20 DIAGNOSIS — R600 Localized edema: Secondary | ICD-10-CM

## 2022-06-20 DIAGNOSIS — E782 Mixed hyperlipidemia: Secondary | ICD-10-CM

## 2022-06-20 MED ORDER — LOSARTAN POTASSIUM 25 MG PO TABS: ORAL_TABLET | ORAL | 3 refills | Status: DC

## 2022-06-20 MED ORDER — ROSUVASTATIN CALCIUM 10 MG PO TABS: 10.0000 mg | ORAL_TABLET | Freq: Every day | ORAL | 3 refills | Status: DC

## 2022-06-20 NOTE — Progress Notes (Unsigned)
Primary Physician/Referring:  Tisovec, Fransico Him, MD  Patient ID: Martin Stephenson, male    DOB: 07-30-1941, 81 y.o.   MRN: [id number]  No chief complaint on file.  HPI:    Martin Stephenson  is a 81 y.o. 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 LVEF had improved to 56% on echocardiogram in 01/2021.   Patient was last seen in our office 04/17/2022 at which time he was stable from a cardiovascular standpoint and he was advised to follow-up with PCP regarding renal insufficiency.  Patient now presents for 8-week follow-up per patient preference.   Presently doing well and essentially remains asymptomatic except for chronic bilateral lower extremity edema for which he takes torsemide only on a as needed basis.     Past Medical History:  Diagnosis Date   Allergy    Anemia    Arthritis    foot   Congestive heart failure (HCC)    Constipation    uses mag citrate OTC if needed   GERD (gastroesophageal reflux disease)    Hiatal hernia    Hyperlipidemia    Hypertension    Kidney stone    Sleep apnea    wears cpap    Family History  Problem Relation Age of Onset   Colon cancer Mother    Hypertension Sister    Hypertension Sister    Hypertension Brother    Stomach cancer Neg Hx    Colon polyps Neg Hx    Esophageal cancer Neg Hx    Rectal cancer Neg Hx     Social History   Tobacco Use   Smoking status: Never   Smokeless tobacco: Never  Substance Use Topics   Alcohol use: No   Marital Status: Married  ROS  Review of Systems  Cardiovascular:  Positive for dyspnea on exertion (intermittent) and leg swelling (chronic). Negative for  chest pain (no recurrence).  Gastrointestinal:  Negative for melena.   Objective  Blood pressure 136/80, pulse 64, temperature 98.1 F (36.7 C), temperature source Temporal, resp. rate 16, height 6' 3"  (1.905 m), weight 245 lb 3.2 oz (111.2 kg), SpO2 98 %.     06/20/2022    3:13 PM 06/20/2022    2:14 PM 04/17/2022    9:06 AM  Vitals with BMI  Height  6' 3"  6' 3"   Weight  245 lbs 3 oz 248 lbs 13 oz  BMI  85.63 14.9  Systolic 702 637 858  Diastolic 80 77 70  Pulse  64 61     Physical Exam Vitals reviewed.  Constitutional:      Appearance: He is obese.  Cardiovascular:     Rate and Rhythm: Normal rate and regular rhythm.     Pulses: Normal pulses and intact distal pulses.     Heart sounds: Normal heart sounds, S1 normal and S2 normal. No murmur heard.    No gallop.  Pulmonary:     Effort: Pulmonary effort is normal. No respiratory distress.     Breath sounds: Normal breath sounds. No wheezing, rhonchi or rales.  Abdominal:     General: Bowel sounds are normal.     Palpations: Abdomen is soft.  Musculoskeletal:  Right lower leg: Edema (2+ pitting below knee) present.     Left lower leg: Edema (2 pitting below knee) present.  Skin:    Capillary Refill: Capillary refill takes less than 2 seconds.    Laboratory examination:   Recent Labs    04/09/22 1249 04/16/22 1258  NA 146* 147*  K 4.5 4.5  CL 108* 107*  CO2 22 24  GLUCOSE 108* 97  BUN 26 27  CREATININE 1.66* 1.75*  CALCIUM 9.1 9.7   CrCl cannot be calculated (Patient's most recent lab result is older than the maximum 21 days allowed.).     Latest Ref Rng & Units 04/16/2022   12:58 PM 04/09/2022   12:49 PM 05/09/2021    1:49 PM  CMP  Glucose 70 - 99 mg/dL 97  108  116   BUN 8 - 27 mg/dL 27  26  22    Creatinine 0.76 - 1.27 mg/dL 1.75  1.66  1.55   Sodium 134 - 144 mmol/L 147  146  142   Potassium 3.5 - 5.2 mmol/L 4.5  4.5  4.4   Chloride 96 - 106 mmol/L 107  108  105   CO2 20 - 29 mmol/L 24  22  24     Calcium 8.6 - 10.2 mg/dL 9.7  9.1  9.0       Latest Ref Rng & Units 03/14/2021    8:17 AM 03/14/2021    8:10 AM 03/06/2021    3:57 PM  CBC  WBC 3.4 - 10.8 x10E3/uL   4.7   Hemoglobin 13.0 - 17.0 g/dL 10.9  11.2    11.2  12.2   Hematocrit 39.0 - 52.0 % 32.0  33.0    33.0  36.0   Platelets 150 - 450 x10E3/uL   186    ProBNP    Component Value Date/Time   PROBNP 85 03/06/2021 1557   PROBNP 77.0 03/20/2017 0957     TSH No results for input(s): "TSH" in the last 8760 hours.  External labs:   Labs 05/02/2022: BUN 26, creatinine 1.6, EGFR 50 mL, potassium 4.8.  Cholesterol, total 134.000 m 01/08/2022 HDL 35.000 mg 01/08/2022 LDL 84.000 mg 01/08/2022 Triglycerides 77.000 mg 01/08/2022  Hemoglobin 10.900 g/d 03/14/2021  Creatinine, Serum 1.750 mg/ 04/16/2022   Labs 10/17/2021:  Potassium 4.2, BUN 23, creatinine 1.590, EGFR 44 mL.  Iron studies normal.  Labs 12/26/2020:  BUN 24, serum glucose 106 mg, sodium 143, potassium 4.4, creatinine 1.47 EGFR 56 mL, CMP otherwise normal.  Uric acid normal at 4.1.  Hemoglobin 11.2/hematocrit 34.0, normal indicis.  Platelets 190.  Cholesterol, total 119.000 m 06/15/2020 HDL 36 MG/DL 06/15/2020 LDL 65.000 mg 06/15/2020 Triglycerides 88.000 06/15/2020  Allergies   Allergies  Allergen Reactions   Labetalol Itching   Aleve [Naproxen Sodium]     Patient has been instructed to avoid Ibuprofen b/c of his Creatinine level.   Ibuprofen Other (See Comments)    Patient has been instructed to avoid Ibuprofen b/c of his Creatinine level.   Furacin [Nitrofurazone] Rash     Final Medications at End of Visit     Current Outpatient Medications:    allopurinol (ZYLOPRIM) 300 MG tablet, Take 300 mg by mouth daily. , Disp: , Rfl:    amLODipine (NORVASC) 5 MG tablet, TAKE 1 TABLET BY MOUTH EVERY DAY, Disp: 90 tablet, Rfl: 3   aspirin 81 MG tablet, Take 81 mg by mouth daily., Disp: , Rfl:    Calcium Carb-Cholecalciferol (CALCIUM 600/VITAMIN D3 PO),  Take 1 tablet by mouth daily. , Disp: , Rfl:    ferrous sulfate 325 (65 FE) MG tablet, Take 325 mg by mouth every Monday, Wednesday, and Friday., Disp: , Rfl:    isosorbide dinitrate (ISORDIL) 30 MG tablet, Take 1 tablet (30 mg total) by mouth 2 (two) times daily., Disp: 180 tablet, Rfl: 3   nebivolol (BYSTOLIC) 5 MG tablet, TAKE 1 TABLET (5 MG TOTAL) BY MOUTH DAILY., Disp: 90 tablet, Rfl: 1   nitroGLYCERIN (NITROSTAT) 0.4 MG SL tablet, DISSOLVE ONE TABLET UNDER THE TONGUE  AS NEEDED FOR CHEST PAIN (REPEAT EVERY 5 MINUTES IF NEEDED FOR A TOTAL OF 3 TABLETS IN 15 MINUTES. IF NO RELIEF, CALL 911.), Disp: , Rfl:    sodium chloride (OCEAN) 0.65 % nasal spray, Place into the nose as needed., Disp: , Rfl:    torsemide (DEMADEX) 20 MG tablet, Take 1 tablet (20 mg total) by mouth daily as needed (Leg swelling)., Disp: 90 tablet, Rfl: 2   losartan (COZAAR) 25 MG tablet, TAKE 1 TABLET BY MOUTH EVERY DAY IN THE EVENING, Disp: 90 tablet, Rfl: 3   rosuvastatin (CRESTOR) 10 MG tablet, Take 1 tablet (10 mg total) by mouth daily., Disp: 90 tablet, Rfl: 3  Radiology:   Lower extremity venous insufficiency study 02/27/2017: 1. Normal bilateral lower extremity deep venous systems. No evidence of acute or chronic DVT. 2. Unremarkable bilateral lower extremity saphenous venous systems. No evidence of valvular incompetence, reflux, or significant varicose vein disease.  CT angiogram chest 03/22/2017: Cardiovascular: Heart size upper normal. No pericardial effusion. Coronary artery calcification is noted.  Atherosclerotic calcification is noted in the wall of the thoracic aorta.  No filling defect in the opacified pulmonary arteries to suggest the presence of an acute pulmonary embolus.  MRI neck 02/22/2021:  Multilevel degenerative change in the cervical spine. 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  Cardiac Studies:   Heart Cath   [2007]: at [facility] hosp: Normal coronary arteries.  Sleep Study   [[facility]]: Severe sleep apnea  Exercise Myoview stress test 02/13/2021: Exercise nuclear stress test was performed using Bruce protocol. Patient reached 7 METS, and 101% of age predicted maximum heart rate. Exercise capacity was low. Non-limiting chest pain reported. Heart rate and hemodynamic response were normal. Peak EKG demonstrated sinus tachycardia, 2-3 mm horizontal/down-sloping ST depressions in leads II, III, aVF, V4-V6, partially normalize 2 min into recovery. SPECT images showed medium sized, mild intensity, mildly reversible perfusion defect in basal-mid inferoseptal, inferior, inferolateral myocardium. Stress LVEF is calculated 44%, although visually appears normal.  Intermediate risk study.   Compared to treadmill stress test in 2018, no significant change in exercise capacity although there were no ST-T wave changes of ischemia.  Right and left heart catheterization 03/14/2021:  Normal right heart catheterization with preserved cardiac output and cardiac index. LV: Mild decrease in LVEF, global hypokinesis, EF from 45% to at most 50%.  No significant mitral regurgitation.  Normal EDP.  No pressure gradient across the aortic valve. RCA: Large vessel, with mild diffuse disease. Left main: Large vessel, distal left main has 20 to 30% stenosis.  Mild calcification is evident. LAD: Large-caliber vessel giving origin to moderate to large size D1 and D2.  After the origin of a large D1, there is a 70%, appears to be high-grade intracranial views, mildly calcified lesion noted.  Otherwise there is mild  disease.  IVUS revealing 67% stenosis with a minimum luminal area of 5.81 mm.  Lesion left alone. Circumflex: Very large caliber vessel giving origin to 2 large marginals.  The OM1 has proximal 30 to 40% stenosis.  Ostium of the circumflex has a calcific 70 or  high-grade stenosis. IVUS confirmed high-grade stenosis, 4.0 x 4.0 mm vessel, tightest segment 1.7 by 1.7.  Minimum lumen 2.35 mm.  Post PCI lumen area 14.58 mm.  A 4.0 x 8 mm resolute Onyx DES was deployed extending into the distal left main and left main stent was optimized with a 5.0 x 8 mm balloon.  IVUS confirmed excellent apposition of the stent. Balloon angioplasty to ostial LAD to open up the stent struts with a 3.5 x 8 mm balloon.  IVUS confirmed no significant stent distortion.   Recommendation: Patient hopefully will get benefit from the circumflex PCI.  Mid LAD has a moderate to high-grade stenosis, intermediate range, in view of absence of ischemia in the anterior territory by nuclear stress test we will leave it alone.  Continue medical therapy.  150 mL contrast utilized.   Patient needs back surgery, this will be postponed for at least 3 months if not 6 months.  PCV ECHOCARDIOGRAM COMPLETE 03/22/2022 Left ventricle cavity is normal in size. Moderate concentric hypertrophy of the left ventricle. Normal global wall motion. Normal LV systolic function with EF 56%. Doppler evidence of grade I (impaired) diastolic dysfunction, normal LAP. Trileaflet aortic valve.  Moderate (Grade II) aortic regurgitation. Mild aortic valve leaflet calcification. Mild (Grade I) mitral regurgitation. Mild tricuspid regurgitation. No evidence of pulmonary hypertension. Previous study on 02/17/2021 noted LVEF 45-50%, grade III MR.  EKG:   EKG 06/20/2022: Normal sinus rhythm with rate of 69 bpm, normal EKG.  Compared to 03/19/2022, sinus bradycardia at rate of 49 bpm.  Assessment     ICD-10-CM   1. Essential hypertension  I10     2. Coronary artery disease of native artery of native heart with stable angina pectoris (HCC)  I25.118 EKG 12-Lead    losartan (COZAAR) 25 MG tablet    3. Bilateral leg edema  R60.0     4. Stage 3a chronic kidney disease (HCC)  N18.31 losartan (COZAAR) 25 MG tablet    5.  Mixed hyperlipidemia  E78.2 rosuvastatin (CRESTOR) 10 MG tablet      Medications Discontinued During This Encounter  Medication Reason   losartan (COZAAR) 25 MG tablet Reorder   rosuvastatin (CRESTOR) 10 MG tablet Reorder     Meds ordered this encounter  Medications   losartan (COZAAR) 25 MG tablet    Sig: TAKE 1 TABLET BY MOUTH EVERY DAY IN THE EVENING    Dispense:  90 tablet    Refill:  3   rosuvastatin (CRESTOR) 10 MG tablet    Sig: Take 1 tablet (10 mg total) by mouth daily.    Dispense:  90 tablet    Refill:  3   Orders Placed This Encounter  Procedures   EKG 12-Lead    Recommendations:   Martin Stephenson is a 63 y.o. 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 LVEF had improved to 56% on echocardiogram in 01/2021.   Patient  was last seen in our office 04/17/2022 at which time he was stable from a cardiovascular standpoint and he was advised to follow-up with PCP regarding renal insufficiency.  Patient now presents for 8-week follow-up per patient preference.   Presently doing well and essentially remains asymptomatic except for chronic bilateral lower extremity edema for which he takes torsemide only on a as needed basis.  Otherwise blood pressure is well controlled, he has noticed marked improvement in overall wellbeing and also energy levels.  Reviewed his labs, renal function has remained stable, he has not had any recurrence of angina pectoris, no clinical evidence of heart failure, advised him to continue to keep his legs elevated while resting to decrease edema and to continue wearing support stockings.  I reviewed his labs  from [place], his renal function has remained stable and creatinine is back down to 1.6.  He also has an appointment to see nephrologist with [facility] sometime in the next month.  Reviewed his lipids, LDL is not at goal, goal LDL <70.  He has been taking Lipitor every other day, increase to 2 daily basis.  This follow-up he has an appointment to see Dr. Osborne Casco at which point he will obtain lipid profile testing.  If LDL is not at goal, Zetia can be added.  Otherwise stable from cardiac standpoint, I will see him back in 6 months or sooner if problems.    [name], PA-C 06/21/2022, 10:38 PM Office: [phone]

## 2022-06-27 ENCOUNTER — Other Ambulatory Visit: Payer: Self-pay | Admitting: Cardiology

## 2022-06-27 ENCOUNTER — Other Ambulatory Visit: Payer: Self-pay | Admitting: Student

## 2022-06-27 DIAGNOSIS — I25118 Atherosclerotic heart disease of native coronary artery with other forms of angina pectoris: Secondary | ICD-10-CM

## 2022-06-27 DIAGNOSIS — I10 Essential (primary) hypertension: Secondary | ICD-10-CM

## 2022-07-04 ENCOUNTER — Encounter: Payer: Self-pay | Admitting: Gastroenterology

## 2022-07-25 ENCOUNTER — Encounter: Payer: Self-pay | Admitting: Gastroenterology

## 2022-08-28 ENCOUNTER — Other Ambulatory Visit: Payer: Self-pay | Admitting: Internal Medicine

## 2022-08-28 DIAGNOSIS — N1832 Chronic kidney disease, stage 3b: Secondary | ICD-10-CM

## 2022-08-30 ENCOUNTER — Ambulatory Visit
Admission: RE | Admit: 2022-08-30 | Discharge: 2022-08-30 | Disposition: A | Discharge status: Home / Self Care | Payer: No Typology Code available for payment source | Source: Ambulatory Visit | Attending: Internal Medicine | Admitting: Internal Medicine

## 2022-08-30 DIAGNOSIS — N1832 Chronic kidney disease, stage 3b: Secondary | ICD-10-CM

## 2022-09-05 ENCOUNTER — Other Ambulatory Visit: Payer: Self-pay | Admitting: Rehabilitation

## 2022-09-05 DIAGNOSIS — M5416 Radiculopathy, lumbar region: Secondary | ICD-10-CM

## 2022-09-05 DIAGNOSIS — M47816 Spondylosis without myelopathy or radiculopathy, lumbar region: Secondary | ICD-10-CM

## 2022-09-08 ENCOUNTER — Other Ambulatory Visit: Payer: Self-pay | Admitting: Cardiology

## 2022-09-08 DIAGNOSIS — I10 Essential (primary) hypertension: Secondary | ICD-10-CM

## 2022-09-08 DIAGNOSIS — I25118 Atherosclerotic heart disease of native coronary artery with other forms of angina pectoris: Secondary | ICD-10-CM

## 2022-09-19 ENCOUNTER — Ambulatory Visit
Admission: RE | Admit: 2022-09-19 | Discharge: 2022-09-19 | Disposition: A | Discharge status: Home / Self Care | Payer: Medicare Other | Source: Ambulatory Visit | Attending: Rehabilitation | Admitting: Rehabilitation

## 2022-09-19 DIAGNOSIS — M47816 Spondylosis without myelopathy or radiculopathy, lumbar region: Secondary | ICD-10-CM

## 2022-09-19 DIAGNOSIS — M5416 Radiculopathy, lumbar region: Secondary | ICD-10-CM

## 2022-10-03 ENCOUNTER — Ambulatory Visit: Payer: Medicare Other | Admitting: Gastroenterology

## 2022-10-03 ENCOUNTER — Encounter: Payer: Self-pay | Admitting: Gastroenterology

## 2022-10-03 VITALS — BP 130/68 | HR 60 | Ht 75.0 in | Wt 240.4 lb

## 2022-10-03 DIAGNOSIS — R195 Other fecal abnormalities: Secondary | ICD-10-CM

## 2022-10-03 DIAGNOSIS — Z800 Family history of malignant neoplasm of digestive organs: Secondary | ICD-10-CM

## 2022-10-03 DIAGNOSIS — Z86010 Personal history of colonic polyps: Secondary | ICD-10-CM | POA: Diagnosis not present

## 2022-10-03 MED ORDER — NA SULFATE-K SULFATE-MG SULF 17.5-3.13-1.6 GM/177ML PO SOLN: 1.0000 | Freq: Once | ORAL | 0 refills | Status: AC

## 2022-10-03 NOTE — Progress Notes (Signed)
Assessment    Heme positive stool - R/O colorectal neoplasms, hemorrhoids Personal history of adenomatous colon polyps, family history of colon cancer.   Recommendations   Schedule colonoscopy. The risks (including bleeding, perforation, infection, missed lesions, medication reactions and possible hospitalization or surgery if complications occur), benefits, and alternatives to colonoscopy with possible biopsy and possible polypectomy were discussed with the patient and they consent to proceed.     HPI   Chief complaint: Heme positive stool, personal history of adenomatous colon polyps and a family history of colon cancer.   Patient profile:  Martin Stephenson is a 81 y.o. male referred by Domenick Gong for a personal history of adenomatous colon polyps, a family history of colon cancer and FOBT + stool.  Recent screening studies performed at his annual physical exam showed occult blood in his stool.  He has anemia of chronic disease with Hgb=11.7 from July 2023.  Records from his annual physical exam on July 23, 2022 from Dr. Loren Racer office were reviewed.  He has no ongoing gastrointestinal complaints. Denies weight loss, abdominal pain, constipation, diarrhea, change in stool caliber, melena, hematochezia, nausea, vomiting, dysphagia, reflux symptoms, chest pain.   Previous Labs / Imaging::    Latest Ref Rng & Units 03/14/2021    8:17 AM 03/14/2021    8:10 AM 03/06/2021    3:57 PM  CBC  WBC 3.4 - 10.8 x10E3/uL   4.7   Hemoglobin 13.0 - 17.0 g/dL 10.9  11.2    11.2  12.2   Hematocrit 39.0 - 52.0 % 32.0  33.0    33.0  36.0   Platelets 150 - 450 x10E3/uL   186     No results found for: "LIPASE"    Latest Ref Rng & Units 04/16/2022   12:58 PM 04/09/2022   12:49 PM 05/09/2021    1:49 PM  CMP  Glucose 70 - 99 mg/dL 97  108  116   BUN 8 - 27 mg/dL '27  26  22   '$ Creatinine 0.76 - 1.27 mg/dL 1.75  1.66  1.55   Sodium 134 - 144 mmol/L 147  146  142   Potassium 3.5 - 5.2 mmol/L 4.5   4.5  4.4   Chloride 96 - 106 mmol/L 107  108  105   CO2 20 - 29 mmol/L '24  22  24   '$ Calcium 8.6 - 10.2 mg/dL 9.7  9.1  9.0      Previous GI evaluation    Endoscopies:  Colonoscopy June 2018: - One 6 mm polyp in the transverse colon, removed with a cold snare. Resected and retrieved. - Diverticulosis in the sigmoid colon. - Internal hemorrhoids. - The examination was otherwise normal on direct and retroflexion views. Path: TA  Imaging:     Past Medical History:  Diagnosis Date   Allergy    Anemia    Arthritis    foot   Congestive heart failure (HCC)    Constipation    uses mag citrate OTC if needed   GERD (gastroesophageal reflux disease)    Hiatal hernia    Hyperlipidemia    Hypertension    Kidney stone    Sleep apnea    wears cpap   Tubular adenoma of colon 05/2017   Past Surgical History:  Procedure Laterality Date   CARDIAC CATHETERIZATION     CERVICAL DISCECTOMY  11/2021   COLONOSCOPY     CORONARY ATHERECTOMY N/A 03/14/2021   Procedure: CORONARY ATHERECTOMY;  Surgeon:  [name], MD;  Location: [facility];  Service: Cardiovascular;  Laterality: N/A;   CORONARY BALLOON ANGIOPLASTY N/A 03/14/2021   Procedure: CORONARY BALLOON ANGIOPLASTY;  Surgeon: [name], MD;  Location: [facility];  Service: Cardiovascular;  Laterality: N/A;   CORONARY STENT INTERVENTION N/A 03/14/2021   Procedure: CORONARY STENT INTERVENTION;  Surgeon: [name], MD;  Location: [facility];  Service: Cardiovascular;  Laterality: N/A;   HAMMER TOE SURGERY  1998   1st toe right foot   hydrocelectomy  1988   INTRAVASCULAR ULTRASOUND/IVUS N/A 03/14/2021   Procedure: Intravascular Ultrasound/IVUS;  Surgeon: [name], MD;  Location: [facility];  Service: Cardiovascular;  Laterality: N/A;   NECK SURGERY     POLYPECTOMY     RIGHT/LEFT HEART CATH AND CORONARY ANGIOGRAPHY N/A 03/14/2021   Procedure: RIGHT/LEFT HEART CATH AND CORONARY ANGIOGRAPHY;  Surgeon:  [name], MD;  Location: [facility];  Service: Cardiovascular;  Laterality: N/A;   TONSILLECTOMY  1988   Family History  Problem Relation Age of Onset   Colon cancer Mother    Hypertension Sister    Hypertension Sister    Hypertension Brother    Stomach cancer Neg Hx    Colon polyps Neg Hx    Esophageal cancer Neg Hx    Rectal cancer Neg Hx    Social History   Tobacco Use   Smoking status: Never   Smokeless tobacco: Never  Vaping Use   Vaping Use: Never used  Substance Use Topics   Alcohol use: No   Drug use: No   Current Outpatient Medications  Medication Sig Dispense Refill   allopurinol (ZYLOPRIM) 300 MG tablet Take 300 mg by mouth daily.      amLODipine (NORVASC) 5 MG tablet TAKE 1 TABLET BY MOUTH EVERY DAY 90 tablet 3   aspirin 81 MG tablet Take 81 mg by mouth daily.     Calcium Carb-Cholecalciferol (CALCIUM 600/VITAMIN D3 PO) Take 1 tablet by mouth daily.      ferrous sulfate 325 (65 FE) MG tablet Take 325 mg by mouth every Monday, Wednesday, and Friday.     isosorbide dinitrate (ISORDIL) 30 MG tablet Take 1 tablet (30 mg total) by mouth 2 (two) times daily. 180 tablet 3   losartan (COZAAR) 25 MG tablet TAKE 1 TABLET BY MOUTH EVERY DAY IN THE EVENING 90 tablet 3   Nebivolol HCl 20 MG TABS TAKE 1 TABLET BY MOUTH EVERY DAY 90 tablet 3   nitroGLYCERIN (NITROSTAT) 0.4 MG SL tablet DISSOLVE ONE TABLET UNDER THE TONGUE  AS NEEDED FOR CHEST PAIN (REPEAT EVERY 5 MINUTES IF NEEDED FOR A TOTAL OF 3 TABLETS IN 15 MINUTES. IF NO RELIEF, CALL 911.)     rosuvastatin (CRESTOR) 10 MG tablet Take 1 tablet (10 mg total) by mouth daily. 90 tablet 3   sodium chloride (OCEAN) 0.65 % nasal spray Place into the nose as needed.     nebivolol (BYSTOLIC) 5 MG tablet TAKE 1 TABLET (5 MG TOTAL) BY MOUTH DAILY. (Patient not taking: Reported on 10/03/2022) 90 tablet 1   torsemide (DEMADEX) 20 MG tablet Take 1 tablet (20 mg total) by mouth daily as needed (Leg swelling). 90 tablet 2   No  current facility-administered medications for this visit.   Allergies  Allergen Reactions   Labetalol Itching   Aleve [Naproxen Sodium]     Patient has been instructed to avoid Ibuprofen b/c of his Creatinine level.   Ibuprofen Other (  See Comments)    Patient has been instructed to avoid Ibuprofen b/c of his Creatinine level.   Furacin [Nitrofurazone] Rash    Review of Systems: All other systems reviewed and negative except where noted in HPI.    Physical Exam    Wt Readings from Last 3 Encounters:  10/03/22 240 lb 6 oz (109 kg)  06/20/22 245 lb 3.2 oz (111.2 kg)  04/17/22 248 lb 12.8 oz (112.9 kg)    BP 130/68   Pulse 60   Ht '6\' 3"'$  (1.905 m)   Wt 240 lb 6 oz (109 kg)   BMI 30.04 kg/m  Constitutional:  Generally well appearing male in no acute distress. Psychiatric: Pleasant. Normal mood and affect. Behavior is normal. HEENT: Pupils normal.  Conjunctivae are normal. No scleral icterus. Neck supple.  Cardiovascular: Normal rate, regular rhythm. No edema Pulmonary/chest: Effort normal and breath sounds normal. No wheezing, rales or rhonchi. Abdominal: Soft, nondistended, nontender. Bowel sounds active throughout. There are no masses palpable. No hepatomegaly. Rectal: Deferred to colonoscopy Neurological: Alert and oriented to person place and time. Skin: Skin is warm and dry. No rashes noted.  Lucio Edward, MD   cc:  Referring Provider Tisovec, Fransico Him, MD

## 2022-10-03 NOTE — Patient Instructions (Signed)
You have been scheduled for a colonoscopy. Please follow written instructions given to you at your visit today.  Please pick up your prep supplies at the pharmacy within the next 1-3 days. If you use inhalers (even only as needed), please bring them with you on the day of your procedure.  The [facility] providers would like to encourage you to use MYCHART to communicate with providers for non-urgent requests or questions.  Due to long hold times on the telephone, sending your provider a message by MYCHART may be a faster and more efficient way to get a response.  Please allow 48 business hours for a response.  Please remember that this is for non-urgent requests.   Due to recent changes in healthcare laws, you may see the results of your imaging and laboratory studies on MyChart before your provider has had a chance to review them.  We understand that in some cases there may be results that are confusing or concerning to you. Not all laboratory results come back in the same time frame and the provider may be waiting for multiple results in order to interpret others.  Please give us 48 hours in order for your provider to thoroughly review all the results before contacting the office for clarification of your results.   Thank you for choosing me and [facility].  Malcolm T. Stark, Jr., MD., FACG  

## 2022-10-30 ENCOUNTER — Encounter (HOSPITAL_COMMUNITY): Payer: Self-pay

## 2022-10-30 ENCOUNTER — Other Ambulatory Visit: Payer: Self-pay

## 2022-10-30 ENCOUNTER — Emergency Department (HOSPITAL_COMMUNITY): Payer: Medicare Other

## 2022-10-30 ENCOUNTER — Emergency Department (HOSPITAL_COMMUNITY)
Admission: EM | Admit: 2022-10-30 | Discharge: 2022-10-31 | Disposition: A | Discharge status: Home / Self Care | Payer: Medicare Other | Attending: Emergency Medicine | Admitting: Emergency Medicine

## 2022-10-30 DIAGNOSIS — Z79899 Other long term (current) drug therapy: Secondary | ICD-10-CM | POA: Diagnosis not present

## 2022-10-30 DIAGNOSIS — R7989 Other specified abnormal findings of blood chemistry: Secondary | ICD-10-CM | POA: Insufficient documentation

## 2022-10-30 DIAGNOSIS — M545 Low back pain, unspecified: Secondary | ICD-10-CM | POA: Diagnosis not present

## 2022-10-30 DIAGNOSIS — Z1152 Encounter for screening for COVID-19: Secondary | ICD-10-CM | POA: Diagnosis not present

## 2022-10-30 DIAGNOSIS — I509 Heart failure, unspecified: Secondary | ICD-10-CM | POA: Diagnosis not present

## 2022-10-30 DIAGNOSIS — Z7982 Long term (current) use of aspirin: Secondary | ICD-10-CM | POA: Diagnosis not present

## 2022-10-30 DIAGNOSIS — R058 Other specified cough: Secondary | ICD-10-CM | POA: Insufficient documentation

## 2022-10-30 DIAGNOSIS — M549 Dorsalgia, unspecified: Secondary | ICD-10-CM | POA: Diagnosis present

## 2022-10-30 DIAGNOSIS — I110 Hypertensive heart disease with heart failure: Secondary | ICD-10-CM | POA: Insufficient documentation

## 2022-10-30 DIAGNOSIS — R0602 Shortness of breath: Secondary | ICD-10-CM | POA: Insufficient documentation

## 2022-10-30 DIAGNOSIS — R1031 Right lower quadrant pain: Secondary | ICD-10-CM | POA: Diagnosis not present

## 2022-10-30 LAB — URINALYSIS, ROUTINE W REFLEX MICROSCOPIC
Bilirubin Urine: NEGATIVE
Glucose, UA: NEGATIVE mg/dL
Ketones, ur: NEGATIVE mg/dL
Leukocytes,Ua: NEGATIVE
Nitrite: NEGATIVE
Protein, ur: 30 mg/dL — AB
Specific Gravity, Urine: 1.018 (ref 1.005–1.030)
pH: 5 (ref 5.0–8.0)

## 2022-10-30 LAB — COMPREHENSIVE METABOLIC PANEL
ALT: 22 U/L (ref 0–44)
AST: 24 U/L (ref 15–41)
Albumin: 3.6 g/dL (ref 3.5–5.0)
Alkaline Phosphatase: 58 U/L (ref 38–126)
Anion gap: 8 (ref 5–15)
BUN: 19 mg/dL (ref 8–23)
CO2: 25 mmol/L (ref 22–32)
Calcium: 9.2 mg/dL (ref 8.9–10.3)
Chloride: 106 mmol/L (ref 98–111)
Creatinine, Ser: 1.35 mg/dL — ABNORMAL HIGH (ref 0.61–1.24)
GFR, Estimated: 53 mL/min — ABNORMAL LOW (ref >60)
Glucose, Bld: 89 mg/dL (ref 70–99)
Potassium: 4.3 mmol/L (ref 3.5–5.1)
Sodium: 139 mmol/L (ref 135–145)
Total Bilirubin: 0.4 mg/dL (ref 0.3–1.2)
Total Protein: 7.4 g/dL (ref 6.5–8.1)

## 2022-10-30 LAB — CBC WITH DIFFERENTIAL/PLATELET
Abs Immature Granulocytes: 0.01 10*3/uL (ref 0.00–0.07)
Basophils Absolute: 0 10*3/uL (ref 0.0–0.1)
Basophils Relative: 0 %
Eosinophils Absolute: 0.1 10*3/uL (ref 0.0–0.5)
Eosinophils Relative: 2 %
HCT: 34.5 % — ABNORMAL LOW (ref 39.0–52.0)
Hemoglobin: 11.3 g/dL — ABNORMAL LOW (ref 13.0–17.0)
Immature Granulocytes: 0 %
Lymphocytes Relative: 31 %
Lymphs Abs: 1.9 10*3/uL (ref 0.7–4.0)
MCH: 33 pg (ref 26.0–34.0)
MCHC: 32.8 g/dL (ref 30.0–36.0)
MCV: 100.9 fL — ABNORMAL HIGH (ref 80.0–100.0)
Monocytes Absolute: 0.6 10*3/uL (ref 0.1–1.0)
Monocytes Relative: 10 %
Neutro Abs: 3.5 10*3/uL (ref 1.7–7.7)
Neutrophils Relative %: 57 %
Platelets: 207 10*3/uL (ref 150–400)
RBC: 3.42 MIL/uL — ABNORMAL LOW (ref 4.22–5.81)
RDW: 13.8 % (ref 11.5–15.5)
WBC: 6.2 10*3/uL (ref 4.0–10.5)
nRBC: 0 % (ref 0.0–0.2)

## 2022-10-30 LAB — RESP PANEL BY RT-PCR (FLU A&B, COVID) ARPGX2
Influenza A by PCR: NEGATIVE
Influenza B by PCR: NEGATIVE
SARS Coronavirus 2 by RT PCR: NEGATIVE

## 2022-10-30 LAB — LIPASE, BLOOD: Lipase: 30 U/L (ref 11–51)

## 2022-10-30 NOTE — ED Provider Triage Note (Signed)
Emergency Medicine Provider Triage Evaluation Note  Martin Stephenson , a 81 y.o. male  was evaluated in triage.  Patient returns for the waiting room for complaints of shortness of breath.  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  He has bilateral lower extremity pitting edema.  On diuretic as needed.  Endorses cough, faint wheezing he noticed in triage.  No wheezing on exam.  Denies chest pain.  Review of Systems  Positive: As above Negative: As above  Physical Exam  BP (!) 167/73 (BP Location: Right Arm)   Pulse 78   Temp 98.7 F (37.1 C) (Oral)   Resp (!) 22   Ht '6\' 3"'$  (1.905 m)   Wt 108.9 kg   SpO2 97%   BMI 30.00 kg/m  Gen:   Awake, no distress   Resp:  Normal effort  MSK:   Moves extremities without difficulty  Other:    Medical Decision Making  Medically screening exam initiated at 11:32 PM.  Appropriate orders placed.  Martin Stephenson was informed that the remainder of the evaluation will be completed by another provider, this initial triage assessment does not replace that evaluation, and the importance of remaining in the ED until their evaluation is complete.     Evlyn Courier, PA-C 10/30/22 [id number]

## 2022-10-30 NOTE — ED Triage Notes (Signed)
Reports right flank pain that radiates around to the front but also has numbness starting in right leg.  Reports increased urination but no blood.  Left flank does not hurt.  Pain started 3-4 days ago.  Denies fever n/v

## 2022-10-30 NOTE — ED Notes (Signed)
Patient reports SOB while sitti ng at waiting area this evening , assisted to triage 1 for evalaution by PA .

## 2022-10-30 NOTE — ED Triage Notes (Signed)
Pt with "gripping" back pain that comes around to his abdomen.  Has been having this pain since falling backwards with a backpack leaf blower recently.  No n/v/d shob chest pain, or shob

## 2022-10-30 NOTE — ED Provider Triage Note (Signed)
Emergency Medicine Provider Triage Evaluation Note  Martin Stephenson , a 81 y.o. male  was evaluated in triage.  Pt complains of right flank pain x2 days.  He is having increased urinary frequency but denies any hematuria or history of stones.  No nausea, vomiting, abdominal pain is more on the side.  He does report he had a fall 2 weeks ago, the pain did not start at that time.  Is not any chest pain or shortness of breath.  Review of Systems  Per HPI  Physical Exam  BP (!) 183/88 (BP Location: Right Arm)   Pulse 77   Temp 99.1 F (37.3 C)   Resp 18   Ht '6\' 3"'$  (1.905 m)   Wt 108.9 kg   SpO2 98%   BMI 30.00 kg/m  Gen:   Awake, no distress   Resp:  Normal effort  MSK:   Moves extremities without difficulty  Other:  +CVAT right  Medical Decision Making  Medically screening exam initiated at 3:55 PM.  Appropriate orders placed.  RODRIQUES BADIE was informed that the remainder of the evaluation will be completed by another provider, this initial triage assessment does not replace that evaluation, and the importance of remaining in the ED until their evaluation is complete.     Sherrill Raring, PA-C 10/30/22 [id number]

## 2022-10-30 NOTE — ED Notes (Signed)
Patient complaining of shob. Vitals updated and patient taken to triage for re-assessment. Triage RN Mortimer Fries notified.

## 2022-10-31 ENCOUNTER — Emergency Department (HOSPITAL_COMMUNITY): Payer: Medicare Other

## 2022-10-31 ENCOUNTER — Telehealth: Payer: Self-pay | Admitting: Gastroenterology

## 2022-10-31 LAB — TROPONIN I (HIGH SENSITIVITY)
Troponin I (High Sensitivity): 8 ng/L (ref <18)
Troponin I (High Sensitivity): 8 ng/L (ref <18)

## 2022-10-31 LAB — BRAIN NATRIURETIC PEPTIDE: B Natriuretic Peptide: 36.7 pg/mL (ref 0.0–100.0)

## 2022-10-31 MED ORDER — HYDROCODONE-ACETAMINOPHEN 5-325 MG PO TABS: 1.0000 | ORAL_TABLET | ORAL | 0 refills | Status: DC | PRN

## 2022-10-31 MED ORDER — IOHEXOL 350 MG/ML SOLN
70.0000 mL | Freq: Once | INTRAVENOUS | Status: AC | PRN
  Administered 2022-10-31: 70 mL via INTRAVENOUS

## 2022-10-31 MED ORDER — SODIUM CHLORIDE 0.9 % IV BOLUS
1000.0000 mL | Freq: Once | INTRAVENOUS | Status: AC
  Administered 2022-10-31: 1000 mL via INTRAVENOUS

## 2022-10-31 MED ORDER — METHOCARBAMOL 500 MG PO TABS: 500.0000 mg | ORAL_TABLET | Freq: Two times a day (BID) | ORAL | 0 refills | Status: DC

## 2022-10-31 NOTE — Telephone Encounter (Signed)
Left message for patient to call back  

## 2022-10-31 NOTE — ED Notes (Signed)
Patient transported to CT 

## 2022-10-31 NOTE — ED Provider Notes (Signed)
[facility] EMERGENCY DEPARTMENT Provider Note   CSN: [id number] Arrival date & time: 10/30/22  1406     History  Chief Complaint  Patient presents with   Back Pain    SOB   Flank Pain    Martin Stephenson is a 81 y.o. male.  Pt is a 81 yo male with a pmhx significant for gout, htn, anemia, hld, chf, kidney stones, and sleep apnea.  Pt said he fell while leaf blowing a few days ago.  He fell onto his back which had the battery pack for the leaf blower.  He has pain in her back radiating to his right lower abd.  He said the pain has been intermittent, but it can be severe.  He has no pain now.  Unfortunately, pt waited nearly 22 hours prior to being seen.  Pt denies sob, but he has pain when he takes a deep breath.  He's had a cough, but no fever.         Home Medications Prior to Admission medications   Medication Sig Start Date End Date Taking? Authorizing Provider  HYDROcodone-acetaminophen (NORCO/VICODIN) 5-325 MG tablet Take 1 tablet by mouth every 4 (four) hours as needed. 10/31/22  Yes Isla Pence, MD  methocarbamol (ROBAXIN) 500 MG tablet Take 1 tablet (500 mg total) by mouth 2 (two) times daily. 10/31/22  Yes Isla Pence, MD  allopurinol (ZYLOPRIM) 300 MG tablet Take 300 mg by mouth daily.     [provider]  amLODipine (NORVASC) 5 MG tablet TAKE 1 TABLET BY MOUTH EVERY DAY 06/14/22   [name], MD  aspirin 81 MG tablet Take 81 mg by mouth daily.    [provider]  Calcium Carb-Cholecalciferol (CALCIUM 600/VITAMIN D3 PO) Take 1 tablet by mouth daily.     [provider]  ferrous sulfate 325 (65 FE) MG tablet Take 325 mg by mouth every Monday, Wednesday, and Friday. 10/28/20   [provider]  isosorbide dinitrate (ISORDIL) 30 MG tablet Take 1 tablet (30 mg total) by mouth 2 (two) times daily. 01/03/22   [name], MD  losartan (COZAAR) 25 MG tablet TAKE 1 TABLET BY MOUTH EVERY DAY IN THE EVENING 06/20/22   [name], MD  nebivolol (BYSTOLIC) 5 MG tablet TAKE 1 TABLET (5 MG TOTAL) BY MOUTH DAILY. Patient not taking: Reported on 10/03/2022 04/18/22   Cantwell, Celeste C, PA-C  Nebivolol HCl 20 MG TABS TAKE 1 TABLET BY MOUTH EVERY DAY 09/10/22   [name], MD  nitroGLYCERIN (NITROSTAT) 0.4 MG SL tablet DISSOLVE ONE TABLET UNDER THE TONGUE  AS NEEDED FOR CHEST PAIN (REPEAT EVERY 5 MINUTES IF NEEDED FOR A TOTAL OF 3 TABLETS IN 15 MINUTES. IF NO RELIEF, CALL 911.) 04/16/22   [provider]  rosuvastatin (CRESTOR) 10 MG tablet Take 1 tablet (10 mg total) by mouth daily. 06/20/22   [name], MD  sodium chloride (OCEAN) 0.65 % nasal spray Place into the nose as needed.    [provider]  torsemide (DEMADEX) 20 MG tablet Take 1 tablet (20 mg total) by mouth daily as needed (Leg swelling). 01/03/22 09/30/22  [name], MD      Allergies    Labetalol, Aleve [naproxen sodium], Ibuprofen, and Furacin [nitrofurazone]    Review of Systems   Review of Systems  Gastrointestinal:  Positive for abdominal pain.  Musculoskeletal:  Positive for back pain.    Physical Exam Updated Vital Signs BP (!) 172/77  Pulse 80   Temp 98.9 F (37.2 C) (Oral)   Resp 16   Ht '6\' 3"'$  (1.905 m)   Wt 108.9 kg   SpO2 98%   BMI 30.00 kg/m  Physical Exam Vitals and nursing note reviewed.  Constitutional:      Appearance: Normal appearance.  HENT:     Head: Normocephalic and atraumatic.     Right Ear: External ear normal.     Left Ear: External ear normal.     Nose: Nose normal.     Mouth/Throat:     Mouth: Mucous membranes are moist.     Pharynx: Oropharynx is clear.  Eyes:     Extraocular Movements: Extraocular movements intact.     Conjunctiva/sclera: Conjunctivae normal.     Pupils: Pupils are equal, round, and reactive to light.  Cardiovascular:     Rate and Rhythm: Normal rate and regular rhythm.     Pulses: Normal pulses.     Heart sounds: Normal heart sounds.  Pulmonary:     Effort: Pulmonary  effort is normal.     Breath sounds: Normal breath sounds.  Abdominal:     General: Abdomen is flat. Bowel sounds are normal.     Palpations: Abdomen is soft.  Musculoskeletal:        General: Normal range of motion.       Arms:     Cervical back: Normal range of motion and neck supple.  Skin:    General: Skin is warm.     Capillary Refill: Capillary refill takes less than 2 seconds.  Neurological:     General: No focal deficit present.     Mental Status: He is alert and oriented to person, place, and time.  Psychiatric:        Mood and Affect: Mood normal.        Behavior: Behavior normal.     ED Results / Procedures / Treatments   Labs (all labs ordered are listed, but only abnormal results are displayed) Labs Reviewed  CBC WITH DIFFERENTIAL/PLATELET - Abnormal; Notable for the following components:      Result Value   RBC 3.42 (*)    Hemoglobin 11.3 (*)    HCT 34.5 (*)    MCV 100.9 (*)    All other components within normal limits  COMPREHENSIVE METABOLIC PANEL - Abnormal; Notable for the following components:   Creatinine, Ser 1.35 (*)    GFR, Estimated 53 (*)    All other components within normal limits  URINALYSIS, ROUTINE W REFLEX MICROSCOPIC - Abnormal; Notable for the following components:   Hgb urine dipstick SMALL (*)    Protein, ur 30 (*)    Bacteria, UA RARE (*)    All other components within normal limits  RESP PANEL BY RT-PCR (FLU A&B, COVID) ARPGX2  LIPASE, BLOOD  BRAIN NATRIURETIC PEPTIDE  TROPONIN I (HIGH SENSITIVITY)  TROPONIN I (HIGH SENSITIVITY)    EKG None  Radiology CT CHEST ABDOMEN PELVIS W CONTRAST  Result Date: 10/31/2022 CLINICAL DATA:  Blunt poly trauma, shortness of breath, wheezing, back and flank pain EXAM: CT CHEST, ABDOMEN, AND PELVIS WITH CONTRAST TECHNIQUE: Multidetector CT imaging of the chest, abdomen and pelvis was performed following the standard protocol during bolus administration of intravenous contrast. RADIATION DOSE  REDUCTION: This exam was performed according to the departmental dose-optimization program which includes automated exposure control, adjustment of the mA and/or kV according to patient size and/or use of iterative reconstruction technique. CONTRAST:  31m OMNIPAQUE IOHEXOL  350 MG/ML SOLN IV. No oral contrast. COMPARISON:  Noncontrast CT abdomen and pelvis 10/30/2022 FINDINGS: CT CHEST FINDINGS Cardiovascular: Atherosclerotic calcifications aorta and coronary arteries. Aorta normal caliber. Heart size normal. No pericardial effusion. Mediastinum/Nodes: Esophagus normal appearance. Base of cervical region normal appearance. Normal sized axillary lymph nodes. No thoracic adenopathy. Lungs/Pleura: Lungs clear. No pulmonary infiltrate, pleural effusion, pneumothorax, or nodule. Musculoskeletal: Prior cervical spine fusion. BILATERAL glenohumeral degenerative changes. No acute osseous findings. CT ABDOMEN PELVIS FINDINGS Hepatobiliary: Gallbladder and liver normal appearance Pancreas: Normal appearance Spleen: Normal appearance.  Small splenule. Adrenals/Urinary Tract: Adrenal glands normal appearance. 6 mm low-attenuation nodule LEFT kidney image 70 consistent with Bosniak 1 cyst; no follow-up imaging recommended. Kidneys, ureters, and bladder otherwise normal appearance. No urinary tract calcification or dilatation. Stomach/Bowel: Stomach and bowel loops normal appearance. Appendix not visualized. Vascular/Lymphatic: Atherosclerotic calcifications aorta without aneurysm. No adenopathy. Reproductive: Mild prostatic enlargement. Seminal vesicles unremarkable. Other: No free air or free fluid. Small umbilical hernia containing fat. Musculoskeletal: Degenerative changes lumbar spine and SI joints. No fractures. Mild degenerative disc disease changes and dextroconvex scoliosis lumbar spine. IMPRESSION: No acute intrathoracic, intra-abdominal, or intrapelvic abnormalities. Mild prostatic enlargement. Small umbilical hernia  containing fat. Aortic Atherosclerosis (ICD10-I70.0). Electronically Signed   By: Lavonia Dana M.D.   On: 10/31/2022 12:48   DG Chest 2 View  Result Date: 10/30/2022 CLINICAL DATA:  Dyspnea, sudden shortness of breath. EXAM: CHEST - 2 VIEW COMPARISON:  02/21/2017. FINDINGS: The heart size and mediastinal contours are within normal limits. Both lungs are clear. Cervical spinal fusion hardware is noted. No acute osseous abnormality. IMPRESSION: No active cardiopulmonary disease. Electronically Signed   By: Brett Fairy M.D.   On: 10/30/2022 23:56   CT Renal Stone Study  Result Date: 10/30/2022 CLINICAL DATA:  Two days of right flank pain. EXAM: CT ABDOMEN AND PELVIS WITHOUT CONTRAST TECHNIQUE: Multidetector CT imaging of the abdomen and pelvis was performed following the standard protocol without IV contrast. RADIATION DOSE REDUCTION: This exam was performed according to the departmental dose-optimization program which includes automated exposure control, adjustment of the mA and/or kV according to patient size and/or use of iterative reconstruction technique. COMPARISON:  MRI lumbar spine September 19, 2022 and CT abdomen pelvis November 29, 2017 FINDINGS: Lower chest: No acute abnormality. Hepatobiliary: Unremarkable noncontrast enhanced appearance of the hepatic parenchyma. Gallbladder is unremarkable. No biliary ductal dilation. Pancreas: No pancreatic ductal dilation or evidence of acute inflammation. Spleen: No splenomegaly. Adrenals/Urinary Tract: Bilateral adrenal glands are within normal limits. No hydronephrosis. No renal, ureteral or bladder calculi identified. Urinary bladder is unremarkable for degree of distension. Stomach/Bowel: Stomach is unremarkable for degree of distension. No pathologic dilation of small or large bowel. The appendix and terminal ileum appear normal. Sigmoid colonic diverticulosis without findings of acute diverticulitis. Vascular/Lymphatic: Aortic atherosclerosis. No  pathologically enlarged abdominal or pelvic lymph nodes. Reproductive: Prostate is unremarkable. Other: Small fat containing umbilical hernia. Musculoskeletal: Dextroconvex curvature of the thoracolumbar spine with associated degenerative change. 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 IMPRESSION: 1. No acute abnormality in the abdomen or pelvis. Specifically, no evidence of nephrolithiasis or hydronephrosis. 2. Sigmoid colonic diverticulosis without findings of acute diverticulitis. 3. Small fat containing umbilical hernia. 4.  Aortic Atherosclerosis (ICD10-I70.0). Electronically Signed   By: Dahlia Bailiff M.D.   On: 10/30/2022 17:37    Procedures Procedures    Medications Ordered in ED Medications  sodium chloride 0.9 % bolus 1,000 mL (1,000  mLs Intravenous New Bag/Given 10/31/22 1206)  iohexol (OMNIPAQUE) 350 MG/ML injection 70 mL (70 mLs Intravenous Contrast Given 10/31/22 1228)    ED Course/ Medical Decision Making/ A&P                           Medical Decision Making Amount and/or Complexity of Data Reviewed Radiology: ordered.  Risk Prescription drug management.   This patient presents to the ED for concern of back and abd pain, this involves an extensive number of treatment options, and is a complaint that carries with it a high risk of complications and morbidity.  The differential diagnosis includes renal stone, trauma, infection   Co morbidities that complicate the patient evaluation  gout, htn, anemia, hld, chf, kidney stones, and sleep apnea   Additional history obtained:  Additional history obtained from epic chart review External records from outside source obtained and reviewed including family   Lab Tests:  I Ordered, and personally interpreted labs.  The pertinent results include:  cbc with hgb 11.3, cmp with cr slightly elevated at 1.35, covid/flu negl lip nl, trop nl; ua + hgb and protein   Imaging  Studies ordered:  I ordered imaging studies including ct renal and ct chest/abd/pelvis for trauma  I independently visualized and interpreted imaging which showed  CT renal: MPRESSION: 1. No acute abnormality in the abdomen or pelvis. Specifically, no evidence of nephrolithiasis or hydronephrosis. 2. Sigmoid colonic diverticulosis without findings of acute diverticulitis. 3. Small fat containing umbilical hernia. 4.  Aortic Atherosclerosis (ICD10-I70.0). CT chest/abd/pelvis: IMPRESSION: No acute intrathoracic, intra-abdominal, or intrapelvic abnormalities. Mild prostatic enlargement. Small umbilical hernia containing fat. Aortic Atherosclerosis (ICD10-I70.0) I agree with the radiologist interpretation   Cardiac Monitoring:  The patient was maintained on a cardiac monitor.  I personally viewed and interpreted the cardiac monitored which showed an underlying rhythm of: nsr   Medicines ordered and prescription drug management:  I ordered medication including ivfs  for dehydration  Reevaluation of the patient after these medicines showed that the patient improved I have reviewed the patients home medicines and have made adjustments as needed   Test Considered:  Ct chest/abd/pelvis   Critical Interventions:  ivfs   Problem List / ED Course:  Abd and back pain:  no definite etiology.  ? Rib contusion from fall radiating into his abd.  Pt is feeling better now.  He is stable for d/c.  Return if worse.  F/u with pcp.   Reevaluation:  After the interventions noted above, I reevaluated the patient and found that they have :improved   Social Determinants of Health:  Lives at home   Dispostion:  After consideration of the diagnostic results and the patients response to treatment, I feel that the patent would benefit from discharge with outpatient f/u.          Final Clinical Impression(s) / ED Diagnoses Final diagnoses:  Acute right-sided low back pain without sciatica   Right lower quadrant abdominal pain    Rx / DC Orders ED Discharge Orders          Ordered    HYDROcodone-acetaminophen (NORCO/VICODIN) 5-325 MG tablet  Every 4 hours PRN        10/31/22 1258    methocarbamol (ROBAXIN) 500 MG tablet  2 times daily        10/31/22 1258              Isla Pence, MD 10/31/22 1301

## 2022-10-31 NOTE — Telephone Encounter (Signed)
Inbound call from patient stating he has been waiting at the emergency room since 2 pm yesterday. Patient states he has been experiencing abdominal pain and also pain in his stomach and his back. Also experiencing numbness and tingling in his fingers. Patient has not yet been admitted but states they did run a chest xray due to shortness of breath. Please advise.  Thank you

## 2022-11-01 ENCOUNTER — Encounter: Payer: No Typology Code available for payment source | Admitting: Gastroenterology

## 2022-11-01 NOTE — Telephone Encounter (Signed)
Left message for patient to call back  

## 2022-11-02 NOTE — Telephone Encounter (Signed)
Unable to reach patient x 3. Left message for patient to call back.

## 2022-11-18 ENCOUNTER — Encounter: Payer: Self-pay | Admitting: Certified Registered Nurse Anesthetist

## 2022-11-22 ENCOUNTER — Ambulatory Visit (AMBULATORY_SURGERY_CENTER): Payer: Medicare Other | Admitting: Gastroenterology

## 2022-11-22 ENCOUNTER — Encounter: Payer: Self-pay | Admitting: Gastroenterology

## 2022-11-22 VITALS — BP 112/66 | HR 73 | Temp 96.9°F | Resp 11 | Ht 75.0 in | Wt 240.0 lb

## 2022-11-22 DIAGNOSIS — K5730 Diverticulosis of large intestine without perforation or abscess without bleeding: Secondary | ICD-10-CM

## 2022-11-22 DIAGNOSIS — Z86010 Personal history of colonic polyps: Secondary | ICD-10-CM

## 2022-11-22 DIAGNOSIS — D123 Benign neoplasm of transverse colon: Secondary | ICD-10-CM

## 2022-11-22 DIAGNOSIS — R194 Change in bowel habit: Secondary | ICD-10-CM | POA: Diagnosis not present

## 2022-11-22 DIAGNOSIS — K635 Polyp of colon: Secondary | ICD-10-CM

## 2022-11-22 DIAGNOSIS — K641 Second degree hemorrhoids: Secondary | ICD-10-CM | POA: Diagnosis not present

## 2022-11-22 MED ORDER — SODIUM CHLORIDE 0.9 % IV SOLN: 500.0000 mL | Freq: Once | INTRAVENOUS | Status: DC

## 2022-11-22 NOTE — Progress Notes (Signed)
Called to room to assist during endoscopic procedure.  Patient ID and intended procedure confirmed with present staff. Received instructions for my participation in the procedure from the performing physician.  

## 2022-11-22 NOTE — Progress Notes (Signed)
See 10/31/2022 H&P, no changes

## 2022-11-22 NOTE — Progress Notes (Signed)
Report given to PACU, vss 

## 2022-11-22 NOTE — Patient Instructions (Signed)
Please read handouts provided. Continue present medications. Await pathology results. Resume previous diet. No repeat colonoscopy due to age.  YOU HAD AN ENDOSCOPIC PROCEDURE TODAY AT [facility]:   Refer to the procedure report that was given to you for any specific questions about what was found during the examination.  If the procedure report does not answer your questions, please call your gastroenterologist to clarify.  If you requested that your care partner not be given the details of your procedure findings, then the procedure report has been included in a sealed envelope for you to review at your convenience later.  YOU SHOULD EXPECT: Some feelings of bloating in the abdomen. Passage of more gas than usual.  Walking can help get rid of the air that was put into your GI tract during the procedure and reduce the bloating. If you had a lower endoscopy (such as a colonoscopy or flexible sigmoidoscopy) you may notice spotting of blood in your stool or on the toilet paper. If you underwent a bowel prep for your procedure, you may not have a normal bowel movement for a few days.  Please Note:  You might notice some irritation and congestion in your nose or some drainage.  This is from the oxygen used during your procedure.  There is no need for concern and it should clear up in a day or so.  SYMPTOMS TO REPORT IMMEDIATELY:  Following lower endoscopy (colonoscopy or flexible sigmoidoscopy):  Excessive amounts of blood in the stool  Significant tenderness or worsening of abdominal pains  Swelling of the abdomen that is new, acute  Fever of 100F or higher   For urgent or emergent issues, a gastroenterologist can be reached at any hour by calling [phone]. Do not use MyChart messaging for urgent concerns.    DIET:  We do recommend a small meal at first, but then you may proceed to your regular diet.  Drink plenty of fluids but you should avoid alcoholic beverages for 24  hours.  ACTIVITY:  You should plan to take it easy for the rest of today and you should NOT DRIVE or use heavy machinery until tomorrow (because of the sedation medicines used during the test).    FOLLOW UP: Our staff will call the number listed on your records the next business day following your procedure.  We will call around 7:15- 8:00 am to check on you and address any questions or concerns that you may have regarding the information given to you following your procedure. If we do not reach you, we will leave a message.     If any biopsies were taken you will be contacted by phone or by letter within the next 1-3 weeks.  Please call us at [phone] if you have not heard about the biopsies in 3 weeks.    SIGNATURES/CONFIDENTIALITY: You and/or your care partner have signed paperwork which will be entered into your electronic medical record.  These signatures attest to the fact that that the information above on your After Visit Summary has been reviewed and is understood.  Full responsibility of the confidentiality of this discharge information lies with you and/or your care-partner.

## 2022-11-22 NOTE — Op Note (Signed)
[name] Procedure Date: 11/22/2022 3:29 PM MRN: [id number] Endoscopist: Ladene Artist , MD, [id number] Age: 81 Referring MD:  Date of Birth: 1941-11-20 Gender: Male Account #: [id number] Procedure:                Colonoscopy Indications:              Heme positive stool, Personal history of                            adenomatous colon polyps, Family history of colon                            cancer, 1st-degree relative Medicines:                Monitored Anesthesia Care Procedure:                Pre-Anesthesia Assessment:                           - Prior to the procedure, a History and Physical                            was performed, and patient medications and                            allergies were reviewed. The patient's tolerance of                            previous anesthesia was also reviewed. The risks                            and benefits of the procedure and the sedation                            options and risks were discussed with the patient.                            All questions were answered, and informed consent                            was obtained. Prior Anticoagulants: The patient has                            taken no anticoagulant or antiplatelet agents. ASA                            Grade Assessment: III - A patient with severe                            systemic disease. After reviewing the risks and                            benefits, the patient was deemed in satisfactory  condition to undergo the procedure.                           After obtaining informed consent, the colonoscope                            was passed under direct vision. Throughout the                            procedure, the patient's blood pressure, pulse, and                            oxygen saturations were monitored continuously. The                            CF [id number] #[id number] was introduced  through the anus                            and advanced to the the cecum, identified by                            appendiceal orifice and ileocecal valve. The                            ileocecal valve, appendiceal orifice, and rectum                            were photographed. The quality of the bowel                            preparation was good. The colonoscopy was performed                            without difficulty. The patient tolerated the                            procedure well. Scope In: 3:35:36 PM Scope Out: 3:51:02 PM Scope Withdrawal Time: 0 hours 11 minutes 49 seconds  Total Procedure Duration: 0 hours 15 minutes 26 seconds  Findings:                 The perianal and digital rectal examinations were                            normal.                           A 5 mm polyp was found in the transverse colon. The                            polyp was sessile. The polyp was removed with a                            cold snare. Resection and retrieval were complete.  A few small-mouthed diverticula were found in the                            left colon. There was no evidence of diverticular                            bleeding.                           Internal hemorrhoids were found during                            retroflexion. The hemorrhoids were small and Grade                            I (internal hemorrhoids that do not prolapse).                           The exam was otherwise without abnormality on                            direct and retroflexion views. Complications:            No immediate complications. Estimated blood loss:                            None. Estimated Blood Loss:     Estimated blood loss: none. Impression:               - One 5 mm polyp in the transverse colon, removed                            with a cold snare. Resected and retrieved.                           - Mild diverticulosis in the left colon.                            - Internal hemorrhoids.                           - The examination was otherwise normal on direct                            and retroflexion views. Recommendation:           - Patient has a contact number available for                            emergencies. The signs and symptoms of potential                            delayed complications were discussed with the                            patient. Return to normal activities tomorrow.  Written discharge instructions were provided to the                            patient.                           - Resume previous diet.                           - Continue present medications.                           - Await pathology results.                           - No repeat colonoscopy due to age. Ladene Artist, MD 11/22/2022 4:11:07 PM This report has been signed electronically.

## 2022-11-23 ENCOUNTER — Telehealth: Payer: Self-pay | Admitting: *Deleted

## 2022-11-23 NOTE — Telephone Encounter (Signed)
Attempted to call patient for their post-procedure follow-up call. No answer. Left voicemail.   

## 2022-12-05 ENCOUNTER — Encounter: Payer: Self-pay | Admitting: Gastroenterology

## 2022-12-20 ENCOUNTER — Ambulatory Visit: Payer: Medicare Other | Admitting: Cardiology

## 2022-12-20 ENCOUNTER — Encounter: Payer: Self-pay | Admitting: Cardiology

## 2022-12-20 VITALS — BP 136/68 | HR 61 | Ht 75.0 in | Wt 234.0 lb

## 2022-12-20 DIAGNOSIS — I10 Essential (primary) hypertension: Secondary | ICD-10-CM

## 2022-12-20 DIAGNOSIS — N1831 Chronic kidney disease, stage 3a: Secondary | ICD-10-CM

## 2022-12-20 DIAGNOSIS — E782 Mixed hyperlipidemia: Secondary | ICD-10-CM

## 2022-12-20 DIAGNOSIS — I25118 Atherosclerotic heart disease of native coronary artery with other forms of angina pectoris: Secondary | ICD-10-CM

## 2022-12-20 NOTE — Progress Notes (Signed)
Primary Physician/Referring:  Tisovec, Fransico Him, MD  Patient ID: Martin Stephenson, male    DOB: September 23, 1941, 81 y.o.   MRN: [id number]  Chief Complaint  Patient presents with   Essential hypertension   Coronary artery disease of native artery of native heart wi   Hyperlipidemia   Follow-up    6 month    HPI:    Martin Stephenson  is a 81 y.o. 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  Presently doing well and essentially remains asymptomatic except for chronic bilateral lower extremity edema for which he takes torsemide only on a as needed basis.     Past Medical History:  Diagnosis Date   Allergy    Anemia    Arthritis    foot   Congestive heart failure (HCC)    Constipation    uses mag citrate OTC if needed   GERD (gastroesophageal reflux disease)    Hiatal hernia    Hyperlipidemia    Hypertension    Kidney stone    Sleep apnea    wears cpap   Squamous cell cancer of external ear, right    Tubular adenoma of colon 05/2017    Family History  Problem Relation Age of Onset   Colon cancer Mother    Hypertension Sister    Hypertension Sister    Hypertension Brother    Stomach cancer Neg Hx    Colon polyps Neg Hx    Esophageal cancer Neg Hx    Rectal cancer Neg Hx     Social History   Tobacco Use   Smoking status: Never   Smokeless tobacco: Never  Substance Use Topics   Alcohol use: No   Marital Status: Married  ROS  Review of Systems  Cardiovascular:  Positive for dyspnea on exertion (intermittent) and leg swelling (chronic). Negative for  chest pain (no recurrence).  Gastrointestinal:  Negative for melena.   Objective  Blood pressure 136/68, pulse 61, height '6\' 3"'$  (1.905 m), weight 234 lb (106.1 kg), SpO2 96 %.     12/20/2022    3:00 PM 12/20/2022    2:54 PM 11/22/2022    4:14 PM  Vitals with BMI  Height  '6\' 3"'$    Weight  234 lbs   BMI  276.73  Systolic [id number] Diastolic 68 76 66  Pulse 61 63 73     Physical Exam Vitals reviewed.  Neck:     Vascular: No carotid bruit or JVD.  Cardiovascular:     Rate and Rhythm: Normal rate and regular rhythm.     Pulses: Normal pulses and intact distal pulses.     Heart sounds: Normal heart sounds, S1 normal and S2 normal. No murmur heard.    No gallop.  Pulmonary:     Effort: Pulmonary effort is normal. No respiratory distress.     Breath sounds: Normal breath sounds. No wheezing, rhonchi or rales.  Abdominal:     General: Bowel sounds are normal.     Palpations: Abdomen is soft.  Musculoskeletal:     Right lower leg: Edema (2+ pitting at ankles) present.  Left lower leg: Edema (2+ pitting at ankles) present.  Skin:    Capillary Refill: Capillary refill takes less than 2 seconds.    Laboratory examination:   Recent Labs    04/09/22 1249 04/16/22 1258 10/30/22 1610  NA 146* 147* 139  K 4.5 4.5 4.3  CL 108* 107* 106  CO2 '22 24 25  '$ GLUCOSE 108* 97 89  BUN '26 27 19  '$ CREATININE 1.66* 1.75* 1.35*  CALCIUM 9.1 9.7 9.2  GFRNONAA  --   --  53*      Latest Ref Rng & Units 10/30/2022    4:10 PM 04/16/2022   12:58 PM 04/09/2022   12:49 PM  CMP  Glucose 70 - 99 mg/dL 89  97  108   BUN 8 - 23 mg/dL '19  27  26   '$ Creatinine 0.61 - 1.24 mg/dL 1.35  1.75  1.66   Sodium 135 - 145 mmol/L 139  147  146   Potassium 3.5 - 5.1 mmol/L 4.3  4.5  4.5   Chloride 98 - 111 mmol/L 106  107  108   CO2 22 - 32 mmol/L '25  24  22   '$ Calcium 8.9 - 10.3 mg/dL 9.2  9.7  9.1   Total Protein 6.5 - 8.1 g/dL 7.4     Total Bilirubin 0.3 - 1.2 mg/dL 0.4     Alkaline Phos 38 - 126 U/L  58     AST 15 - 41 U/L 24     ALT 0 - 44 U/L 22         Latest Ref Rng & Units 10/30/2022    4:10 PM 03/14/2021    8:17 AM 03/14/2021    8:10 AM  CBC  WBC 4.0 - 10.5 K/uL 6.2     Hemoglobin 13.0 - 17.0 g/dL 11.3  10.9  11.2    11.2   Hematocrit 39.0 - 52.0 % 34.5  32.0  33.0    33.0   Platelets 150 - 400 K/uL 207      External labs:   Cholesterol, total 97.000 mg 07/16/2022 HDL 50.000 mg 07/16/2022 LDL 36.000 mg 07/16/2022 Triglycerides 56.000 mg 07/16/2022  Hemoglobin 11.300 g/d 10/30/2022 Platelets 207.000 K/ 10/30/2022  Creatinine, Serum 1.350 mg/ 10/30/2022 Potassium 4.300 mm 10/30/2022 ALT (SGPT) 22.000 U/L 10/30/2022  TSH 1.210 05/27/2017 BNP 21.300 pg/ 04/09/2022  Allergies   Allergies  Allergen Reactions   Labetalol Itching   Aleve [Naproxen Sodium]     Patient has been instructed to avoid Ibuprofen b/c of his Creatinine level.   Ibuprofen Other (See Comments)    Patient has been instructed to avoid Ibuprofen b/c of his Creatinine level.   Furacin [Nitrofurazone] Rash     Final Medications at End of Visit     Current Outpatient Medications:    Albuterol Sulfate (PROAIR RESPICLICK) 154 (90 Base) MCG/ACT AEPB, 2 puffs into the lungs 4x a day PRN, Disp: , Rfl:    allopurinol (ZYLOPRIM) 300 MG tablet, Take 300 mg by mouth daily. , Disp: , Rfl:    amLODipine (NORVASC) 5 MG tablet, TAKE 1 TABLET BY MOUTH EVERY DAY, Disp: 90 tablet, Rfl: 3   aspirin 81 MG tablet, Take 81 mg by mouth daily., Disp: , Rfl:    Calcium Carb-Cholecalciferol (CALCIUM 600/VITAMIN D3 PO), Take 1 tablet by mouth daily. , Disp: , Rfl:    ferrous sulfate 325 (65 FE) MG tablet, Take 325 mg by mouth every Monday, Wednesday, and Friday., Disp: , Rfl:  fluticasone (FLONASE) 50 MCG/ACT nasal spray, Place 1 spray into both nostrils daily., Disp: , Rfl:    isosorbide dinitrate (ISORDIL) 30 MG tablet, Take 1 tablet (30 mg total) by mouth 2 (two) times daily., Disp: 180 tablet, Rfl: 3   losartan (COZAAR) 25  MG tablet, TAKE 1 TABLET BY MOUTH EVERY DAY IN THE EVENING, Disp: 90 tablet, Rfl: 3   nebivolol (BYSTOLIC) 5 MG tablet, TAKE 1 TABLET (5 MG TOTAL) BY MOUTH DAILY., Disp: 90 tablet, Rfl: 1   nitroGLYCERIN (NITROSTAT) 0.4 MG SL tablet, DISSOLVE ONE TABLET UNDER THE TONGUE  AS NEEDED FOR CHEST PAIN (REPEAT EVERY 5 MINUTES IF NEEDED FOR A TOTAL OF 3 TABLETS IN 15 MINUTES. IF NO RELIEF, CALL 911.), Disp: , Rfl:    rosuvastatin (CRESTOR) 10 MG tablet, Take 1 tablet (10 mg total) by mouth daily., Disp: 90 tablet, Rfl: 3   sodium chloride (OCEAN) 0.65 % nasal spray, Place into the nose as needed., Disp: , Rfl:    torsemide (DEMADEX) 20 MG tablet, Take 1 tablet (20 mg total) by mouth daily as needed (Leg swelling)., Disp: 90 tablet, Rfl: 2  Radiology:   Lower extremity venous insufficiency study 02/27/2017: 1. Normal bilateral lower extremity deep venous systems. No evidence of acute or chronic DVT. 2. Unremarkable bilateral lower extremity saphenous venous systems. No evidence of valvular incompetence, reflux, or significant varicose vein disease.  CT angiogram chest 03/22/2017: Cardiovascular: Heart size upper normal. No pericardial effusion. Coronary artery calcification is noted.  Atherosclerotic calcification is noted in the wall of the thoracic aorta.  No filling defect in the opacified pulmonary arteries to suggest the presence of an acute pulmonary embolus.  MRI neck 02/22/2021:  Multilevel degenerative change in the cervical spine. 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  Cardiac Studies:   Sleep Study   [2015 [facility]]: Severe sleep apnea on CPAP   Exercise Myoview stress test 02/13/2021: Exercise nuclear stress test was performed using Bruce protocol. Patient reached 7 METS, and 101% of age predicted maximum heart rate. Exercise capacity was low.  Non-limiting chest pain reported. Heart rate and hemodynamic response were normal. Peak EKG demonstrated sinus tachycardia, 2-3 mm horizontal/down-sloping ST depressions in leads II, III, aVF, V4-V6, partially normalize 2 min into recovery. SPECT images showed medium sized, mild intensity, mildly reversible perfusion defect in basal-mid inferoseptal, inferior, inferolateral myocardium. Stress LVEF is calculated 44%, although visually appears normal.  Intermediate risk study.   Compared to treadmill stress test in 2018, no significant change in exercise capacity although there were no ST-T wave changes of ischemia.  Right and left heart catheterization 03/14/2021:  Normal right heart catheterization with preserved cardiac output and cardiac index. LV: Mild decrease in LVEF, global hypokinesis, EF from 45% to at most 50%.  No significant mitral regurgitation.  Normal EDP.  No pressure gradient across the aortic valve. RCA: Large vessel, with mild diffuse disease. Left main: Large vessel, distal left main has 20 to 30% stenosis.  Mild calcification is evident. LAD: Large-caliber vessel giving origin to moderate to large size D1 and D2.  After the origin of a large D1, there is a 70%, appears to be high-grade intracranial views, mildly calcified lesion noted.  Otherwise there is mild disease.  IVUS revealing 67% stenosis with a minimum luminal area of 5.81 mm.  Lesion left alone. Circumflex: Very large caliber vessel giving origin  to 2 large marginals.  The OM1 has proximal 30 to 40% stenosis.  Ostium of the circumflex has a calcific 70 or high-grade stenosis. IVUS confirmed high-grade stenosis, 4.0 x 4.0 mm vessel, tightest segment 1.7 by 1.7.  Minimum lumen 2.35 mm.  Post PCI lumen area 14.58 mm.  A 4.0 x 8 mm resolute Onyx DES was deployed extending into the distal left main and left main stent was optimized with a 5.0 x 8 mm balloon.  IVUS confirmed excellent apposition of the stent. Balloon  angioplasty to ostial LAD to open up the stent struts with a 3.5 x 8 mm balloon.  IVUS confirmed no significant stent distortion.     PCV ECHOCARDIOGRAM COMPLETE 03/22/2022 Left ventricle cavity is normal in size. Moderate concentric hypertrophy of the left ventricle. Normal global wall motion. Normal LV systolic function with EF 56%. Doppler evidence of grade I (impaired) diastolic dysfunction, normal LAP. Trileaflet aortic valve.  Moderate (Grade II) aortic regurgitation. Mild aortic valve leaflet calcification. Mild (Grade I) mitral regurgitation. Mild tricuspid regurgitation. No evidence of pulmonary hypertension. Previous study on 02/17/2021 noted LVEF 45-50%, grade III MR.  EKG:    EKG 12/20/2022: Normal sinus rhythm with rate of 62 bpm, normal axis, no evidence of ischemia, normal EKG.  Compared to 06/20/2022, no significant change.     ICD-10-CM   1. Coronary artery disease of native artery of native heart with stable angina pectoris ([place])  I25.118 EKG 12-Lead    2. Essential hypertension  I10     3. Mixed hyperlipidemia  E78.2     4. Stage 3a chronic kidney disease (HCC)  N18.31       Medications Discontinued During This Encounter  Medication Reason   HYDROcodone-acetaminophen (NORCO/VICODIN) 5-325 MG tablet Completed Course   methocarbamol (ROBAXIN) 500 MG tablet Completed Course   metoprolol succinate (TOPROL-XL) 100 MG 24 hr tablet Change in therapy     No orders of the defined types were placed in this encounter.  Orders Placed This Encounter  Procedures   EKG 12-Lead    Recommendations:   Martin Stephenson is a 19 y.o. 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   1. Coronary artery disease of native artery of native heart with stable angina pectoris ([place] Since coronary angioplasty, he has not had worsening dyspnea or chest pain.  He is presently doing well and essentially asymptomatic.  Chronic leg edema is stable as well.  Suspect leg edema is probably due to combination of amlodipine and chronic dependent edema.  No clinical evidence of heart failure.  No changes in the EKG.  2. Essential hypertension Blood pressure is well-controlled.  No changes in the medications were done today.  3. Mixed hyperlipidemia External labs reviewed, lipids under excellent control, continue Crestor 10 mg daily.  4. Stage 3a chronic kidney disease (HCC) Stage III chronic kidney disease has remained stable.  Overall patient is presently doing well, I will see him back on annual basis.    JAdrian Prows PA-C 12/20/2022, 4:00 PM Office: [phone]

## 2022-12-27 ENCOUNTER — Other Ambulatory Visit: Payer: Self-pay | Admitting: Cardiology

## 2022-12-27 DIAGNOSIS — I10 Essential (primary) hypertension: Secondary | ICD-10-CM

## 2022-12-27 DIAGNOSIS — I25118 Atherosclerotic heart disease of native coronary artery with other forms of angina pectoris: Secondary | ICD-10-CM

## 2023-01-28 ENCOUNTER — Ambulatory Visit: Payer: Medicare Other | Admitting: Podiatry

## 2023-01-28 ENCOUNTER — Ambulatory Visit (INDEPENDENT_AMBULATORY_CARE_PROVIDER_SITE_OTHER): Payer: Medicare Other

## 2023-01-28 ENCOUNTER — Encounter: Payer: Self-pay | Admitting: Podiatry

## 2023-01-28 VITALS — BP 163/80

## 2023-01-28 DIAGNOSIS — M7661 Achilles tendinitis, right leg: Secondary | ICD-10-CM | POA: Diagnosis not present

## 2023-01-28 DIAGNOSIS — L84 Corns and callosities: Secondary | ICD-10-CM

## 2023-01-28 DIAGNOSIS — M779 Enthesopathy, unspecified: Secondary | ICD-10-CM

## 2023-01-28 MED ORDER — TRIAMCINOLONE ACETONIDE 10 MG/ML IJ SUSP
10.0000 mg | Freq: Once | INTRAMUSCULAR | Status: AC
  Administered 2023-01-28: 10 mg

## 2023-01-29 NOTE — Progress Notes (Signed)
Subjective:   Patient ID: Martin Stephenson, male   DOB: 82 y.o.   MRN: [id number]   HPI Patient presents with very painful lesion underneath the right forefoot and inflammation pain of the back of the right heel on the lateral side that is been sore for several months.  Patient's had no other change in health history    ROS      Objective:  Physical Exam  Neurovascular status intact with patient found to have significant keratotic lesion subthird metatarsal head right painful when palpated and has inflammation pain of the posterior heel region right with inflammation fluid of the Achilles insertion the lateral side     Assessment:  Painful keratotic lesion right along with acute Achilles tendinitis right     Plan:  Reviewed both conditions and I do think we need to treat the Achilles due to pain I did explain injection and ice therapy along with heel lift I did explain the risk of doing this with the possibility for rupture and he is willing to accept this risk and I went ahead today did sterile prep and carefully injected the lateral side 3 mg dexamethasone Kenalog 5 mg Xylocaine keep it away from the center and medial side of the heel.  I then debrided the lesion there was a small amount of bleeding applied sterile dressing with Neosporin and sterile compression will be seen back as needed for either condition  X-rays indicate that there is spur formation no indication stress fracture arthritis associated with inflammation with screw in place

## 2023-02-11 ENCOUNTER — Ambulatory Visit: Payer: Medicare Other | Admitting: Podiatry

## 2023-02-11 ENCOUNTER — Encounter: Payer: Self-pay | Admitting: Podiatry

## 2023-02-11 DIAGNOSIS — M7661 Achilles tendinitis, right leg: Secondary | ICD-10-CM

## 2023-02-11 DIAGNOSIS — L84 Corns and callosities: Secondary | ICD-10-CM | POA: Diagnosis not present

## 2023-02-11 NOTE — Progress Notes (Signed)
Subjective:   Patient ID: Martin Stephenson, male   DOB: 82 y.o.   MRN: [id number]   HPI Patient presents stating that it is improving but still has some soreness and swelling in the lesions been okay   ROS      Objective:  Physical Exam  Neurovascular status intact diminished inflammation in the posterior right heel still present upon deep palpation but overall much better than it was with patient still concerned and still has multiple questions     Assessment:  Mid substance Achilles tendinitis right improving but still has some discomfort upon deep palpation     Plan:  Reviewed condition recommended the continuation of stretching exercises ice therapy and we discussed the consideration for immobilization boot organ to hold off currently see how it does and then decide if that will be necessary

## 2023-05-02 ENCOUNTER — Ambulatory Visit: Payer: Medicare Other | Admitting: Podiatry

## 2023-05-02 ENCOUNTER — Encounter: Payer: Self-pay | Admitting: Podiatry

## 2023-05-02 DIAGNOSIS — L84 Corns and callosities: Secondary | ICD-10-CM | POA: Diagnosis not present

## 2023-05-02 DIAGNOSIS — D689 Coagulation defect, unspecified: Secondary | ICD-10-CM | POA: Diagnosis not present

## 2023-05-02 DIAGNOSIS — M7661 Achilles tendinitis, right leg: Secondary | ICD-10-CM

## 2023-05-02 NOTE — Progress Notes (Signed)
Subjective:   Patient ID: Martin Stephenson, male   DOB: 82 y.o.   MRN: [id number]   HPI Patient presents with painful callus plantar right that is in 2 areas on the third and fourth metatarsals distinct.  They are sore when pressed he does take a blood thinner   ROS      Objective:  Physical Exam  Neurovascular status unchanged with thick keratotic lesion x 2 plantar aspect right metatarsal 3 and 4     Assessment:  Chronic lesion formation with patient on blood thinner which makes this higher risk     Plan:  Sterile sharp debridement of the lesions and applied bandage with there is a small amount of bleeding on the fourth metatarsal instructed on elevation soaks and reappoint as symptoms indicate

## 2023-05-13 ENCOUNTER — Other Ambulatory Visit: Payer: Self-pay

## 2023-05-13 MED ORDER — NEBIVOLOL HCL 5 MG PO TABS: 5.0000 mg | ORAL_TABLET | Freq: Every day | ORAL | 1 refills | Status: DC

## 2023-06-24 ENCOUNTER — Other Ambulatory Visit: Payer: Self-pay | Admitting: Cardiology

## 2023-06-24 DIAGNOSIS — I10 Essential (primary) hypertension: Secondary | ICD-10-CM

## 2023-08-20 ENCOUNTER — Other Ambulatory Visit: Payer: Self-pay | Admitting: Cardiology

## 2023-08-20 DIAGNOSIS — E782 Mixed hyperlipidemia: Secondary | ICD-10-CM

## 2023-08-27 ENCOUNTER — Encounter: Payer: Self-pay | Admitting: Cardiology

## 2023-08-27 ENCOUNTER — Ambulatory Visit: Payer: Medicare Other | Admitting: Cardiology

## 2023-08-27 VITALS — BP 122/70 | HR 69 | Resp 16 | Ht 75.0 in | Wt 234.8 lb

## 2023-08-27 DIAGNOSIS — D539 Nutritional anemia, unspecified: Secondary | ICD-10-CM

## 2023-08-27 DIAGNOSIS — I10 Essential (primary) hypertension: Secondary | ICD-10-CM

## 2023-08-27 DIAGNOSIS — I25118 Atherosclerotic heart disease of native coronary artery with other forms of angina pectoris: Secondary | ICD-10-CM

## 2023-08-27 DIAGNOSIS — R0609 Other forms of dyspnea: Secondary | ICD-10-CM

## 2023-08-27 DIAGNOSIS — E782 Mixed hyperlipidemia: Secondary | ICD-10-CM

## 2023-08-27 DIAGNOSIS — I351 Nonrheumatic aortic (valve) insufficiency: Secondary | ICD-10-CM

## 2023-08-27 NOTE — Progress Notes (Signed)
Primary Physician/Referring:  Tisovec, Adelfa Koh, MD  Patient ID: Martin Stephenson, male    DOB: 1941-03-26, 82 y.o.   MRN: [id number]  Chief Complaint  Patient presents with   Coronary artery disease of native artery of native heart wi    HPI:    Martin Stephenson  is a 78 y.o. 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  States that over the past 6 months he has noticed again worsening dyspnea.  Chronic leg edema has remained stable.  No PND or orthopnea.  He is accompanied by his wife.  No chest pain, palpitations, dizziness or syncope.  Past Medical History:  Diagnosis Date   Allergy    Anemia    Arthritis    foot   Congestive heart failure (HCC)    Constipation    uses mag citrate OTC if needed   GERD (gastroesophageal reflux disease)    Hiatal hernia    Hyperlipidemia    Hypertension    Kidney stone    Sleep apnea    wears cpap   Squamous cell cancer of external ear, right    Tubular adenoma of colon 05/2017    Family History  Problem Relation Age of Onset   Colon cancer Mother    Hypertension Sister    Hypertension Sister    Hypertension Brother    Stomach cancer Neg Hx    Colon polyps Neg Hx    Esophageal cancer Neg Hx    Rectal cancer Neg Hx     Social History   Tobacco Use   Smoking status: Never   Smokeless tobacco: Never  Substance Use Topics   Alcohol use: No   Marital Status: Married  ROS  Review of Systems  Cardiovascular:  Positive for dyspnea on exertion and leg swelling (chronic). Negative for chest pain (no  recurrence).  Gastrointestinal:  Negative for melena.   Objective  Blood pressure 122/70, pulse 69, resp. rate 16, height 6\' 3"  (1.905 m), weight 234 lb 12.8 oz (106.5 kg), SpO2 96%.     08/27/2023   10:47 AM 08/27/2023   10:38 AM 01/28/2023    9:13 AM  Vitals with BMI  Height  6\' 3"    Weight 234 lbs 13 oz 234 lbs 13 oz   BMI 29.35 29.35   Systolic 122 154 829  Diastolic 70 76 80  Pulse 69 70      Physical Exam Vitals reviewed.  Neck:     Vascular: No carotid bruit or JVD.  Cardiovascular:     Rate and Rhythm: Normal rate and regular rhythm.     Pulses: Normal pulses and intact distal pulses.     Heart sounds: Normal heart sounds, S1 normal and S2 normal. No murmur heard.    No gallop.  Pulmonary:     Effort: Pulmonary effort is normal. No respiratory distress.     Breath sounds: Normal breath sounds. No wheezing, rhonchi or rales.  Abdominal:     General: Bowel sounds are normal.     Palpations: Abdomen is soft.  Musculoskeletal:     Right lower leg: Edema (2+  pitting at ankles) present.     Left lower leg: Edema (2+ pitting at ankles) present.  Skin:    Capillary Refill: Capillary refill takes less than 2 seconds.    Laboratory examination:   Recent Labs    10/30/22 1610  NA 139  K 4.3  CL 106  CO2 25  GLUCOSE 89  BUN 19  CREATININE 1.35*  CALCIUM 9.2  GFRNONAA 53*      Latest Ref Rng & Units 10/30/2022    4:10 PM 04/16/2022   12:58 PM 04/09/2022   12:49 PM  CMP  Glucose 70 - 99 mg/dL 89  97  315   BUN 8 - 23 mg/dL 19  27  26    Creatinine 0.61 - 1.24 mg/dL 1.76  1.60  7.37   Sodium 135 - 145 mmol/L 139  147  146   Potassium 3.5 - 5.1 mmol/L 4.3  4.5  4.5   Chloride 98 - 111 mmol/L 106  107  108   CO2 22 - 32 mmol/L 25  24  22    Calcium 8.9 - 10.3 mg/dL 9.2  9.7  9.1   Total Protein 6.5 - 8.1 g/dL 7.4     Total Bilirubin 0.3 - 1.2 mg/dL 0.4     Alkaline Phos 38 - 126 U/L 58     AST 15 - 41 U/L 24     ALT 0 - 44 U/L 22         Latest Ref Rng & Units  10/30/2022    4:10 PM 03/14/2021    8:17 AM 03/14/2021    8:10 AM  CBC  WBC 4.0 - 10.5 K/uL 6.2     Hemoglobin 13.0 - 17.0 g/dL 10.6  26.9  48.5    46.2   Hematocrit 39.0 - 52.0 % 34.5  32.0  33.0    33.0   Platelets 150 - 400 K/uL 207      External labs:   Labs 04/15/2023:  A1c 6.1%.  TSH was normal at 1.02.  BUN 20, creatinine 1.3, sodium 143, potassium 4.1, EGFR 56 mL.  LFTs normal.  Hb 10.8/HCT 32.7, mild macrocytosis present.  Platelets 163.  Agraffe labs 10/16/2022:  Total cholesterol 118, triglycerides 73, HDL 47, LDL 56.  Allergies   Allergies  Allergen Reactions   Labetalol Itching   Aleve [Naproxen Sodium]     Patient has been instructed to avoid Ibuprofen b/c of his Creatinine level.   Ibuprofen Other (See Comments)    Patient has been instructed to avoid Ibuprofen b/c of his Creatinine level.   Furacin [Nitrofurazone] Rash     Final Medications at End of Visit     Current Outpatient Medications:    Albuterol Sulfate (PROAIR RESPICLICK) 108 (90 Base) MCG/ACT AEPB, 2 puffs into the lungs 4x a day PRN, Disp: , Rfl:    allopurinol (ZYLOPRIM) 300 MG tablet, Take 300 mg by mouth daily. , Disp: , Rfl:    amLODipine (NORVASC) 5 MG tablet, TAKE 1 TABLET BY MOUTH EVERY DAY, Disp: 90 tablet, Rfl: 3   aspirin 81 MG tablet, Take 81 mg by mouth daily., Disp: , Rfl:    Calcium Carb-Cholecalciferol (CALCIUM 600/VITAMIN D3 PO), Take 1 tablet by mouth daily. , Disp: , Rfl:    ferrous sulfate 325 (65 FE) MG tablet, Take 325 mg by mouth every Monday, Wednesday, and Friday., Disp: , Rfl:    fluticasone (FLONASE) 50 MCG/ACT nasal spray, Place 1 spray into both nostrils daily., Disp: ,  Rfl:    isosorbide dinitrate (ISORDIL) 30 MG tablet, TAKE 1 TABLET BY MOUTH 2 TIMES DAILY., Disp: 180 tablet, Rfl: 3   losartan (COZAAR) 25 MG tablet, TAKE 1 TABLET BY MOUTH EVERY DAY IN THE EVENING, Disp: 90 tablet, Rfl: 3   nebivolol (BYSTOLIC) 5 MG tablet, Take 1 tablet (5 mg total) by mouth  daily., Disp: 90 tablet, Rfl: 1   nitroGLYCERIN (NITROSTAT) 0.4 MG SL tablet, DISSOLVE ONE TABLET UNDER THE TONGUE  AS NEEDED FOR CHEST PAIN (REPEAT EVERY 5 MINUTES IF NEEDED FOR A TOTAL OF 3 TABLETS IN 15 MINUTES. IF NO RELIEF, CALL 911.), Disp: , Rfl:    rosuvastatin (CRESTOR) 10 MG tablet, TAKE 1 TABLET BY MOUTH EVERY DAY, Disp: 90 tablet, Rfl: 3   sodium chloride (OCEAN) 0.65 % nasal spray, Place into the nose as needed., Disp: , Rfl:    torsemide (DEMADEX) 20 MG tablet, Take 1 tablet (20 mg total) by mouth daily as needed (Leg swelling)., Disp: 90 tablet, Rfl: 2  Radiology:   Lower extremity venous insufficiency study 02/27/2017: 1. Normal bilateral lower extremity deep venous systems. No evidence of acute or chronic DVT. 2. Unremarkable bilateral lower extremity saphenous venous systems. No evidence of valvular incompetence, reflux, or significant varicose vein disease.  CXR 10/30/2022: Narrative & Impression  CLINICAL DATA:  Dyspnea, sudden shortness of breath.   EXAM: CHEST - 2 VIEW   COMPARISON:  02/21/2017.   FINDINGS: The heart size and mediastinal contours are within normal limits. Both lungs are clear. Cervical spinal fusion hardware is noted. No acute osseous abnormality.    Cardiac Studies:   Sleep Study   [2015 at [facility]]: Severe sleep apnea on CPAP   Exercise Myoview stress test 02/13/2021: Exercise nuclear stress test was performed using Bruce protocol. Patient reached 7 METS, and 101% of age predicted maximum heart rate. Exercise capacity was low. Non-limiting chest pain reported. Heart rate and hemodynamic response were normal. Peak EKG demonstrated sinus tachycardia, 2-3 mm horizontal/down-sloping ST depressions in leads II, III, aVF, V4-V6, partially normalize 2 min into recovery. SPECT images showed medium sized, mild intensity, mildly reversible perfusion defect in basal-mid inferoseptal, inferior, inferolateral myocardium. Stress LVEF is calculated 44%, although  visually appears normal.  Intermediate risk study.   Compared to treadmill stress test in 2018, no significant change in exercise capacity although there were no ST-T wave changes of ischemia.  Right and left heart catheterization 03/14/2021:  Normal right heart catheterization with preserved cardiac output and cardiac index. LV: Mild decrease in LVEF, global hypokinesis, EF from 45% to at most 50%.  No significant mitral regurgitation.  Normal EDP.  No pressure gradient across the aortic valve. RCA: Large vessel, with mild diffuse disease. Left main: Large vessel, distal left main has 20 to 30% stenosis.  Mild calcification is evident. LAD: Large-caliber vessel giving origin to moderate to large size D1 and D2.  After the origin of a large D1, there is a 70%, appears to be high-grade intracranial views, mildly calcified lesion noted.  Otherwise there is mild disease.  IVUS revealing 67% stenosis with a minimum luminal area of 5.81 mm.  Lesion left alone. Circumflex: Very large caliber vessel giving origin to 2 large marginals.  The OM1 has proximal 30 to 40% stenosis.  Ostium of the circumflex has a calcific 70 or high-grade stenosis. IVUS confirmed high-grade stenosis, 4.0 x 4.0 mm vessel, tightest segment 1.7 by 1.7.  Minimum lumen 2.35 mm.  Post PCI lumen area 14.58 mm.  A 4.0 x 8 mm resolute Onyx DES was deployed extending into the distal left main and left main stent was optimized with a 5.0 x 8 mm balloon.  IVUS confirmed excellent apposition of the stent. Balloon angioplasty to ostial LAD to open up the stent struts with a 3.5 x 8 mm balloon.  IVUS confirmed no significant stent distortion.     PCV ECHOCARDIOGRAM COMPLETE 03/22/2022 Left ventricle cavity is normal in size. Moderate concentric hypertrophy of the left ventricle. Normal global wall motion. Normal LV systolic function with EF 56%. Doppler evidence of grade I (impaired) diastolic dysfunction, normal LAP. Trileaflet aortic  valve.  Moderate (Grade II) aortic regurgitation. Mild aortic valve leaflet calcification. Mild (Grade I) mitral regurgitation. Mild tricuspid regurgitation. No evidence of pulmonary hypertension. Previous study on 02/17/2021 noted LVEF 45-50%, grade III MR.  EKG:   EKG 08/27/2023: Normal sinus rhythm at rate of 63 bpm, normal axis, single PVC.  Compared to 12/20/2022, no significant change.    ICD-10-CM   1. Coronary artery disease of native artery of native heart with stable angina pectoris (HCC)  I25.118 EKG 12-Lead    ECHOCARDIOGRAM COMPLETE    2. Dyspnea on exertion  R06.09 ECHOCARDIOGRAM COMPLETE    3. Essential hypertension  I10     4. Moderate aortic regurgitation  I35.1 ECHOCARDIOGRAM COMPLETE    5. Mixed hyperlipidemia  E78.2     6. Macrocytic anemia  D53.9        There are no discontinued medications.    No orders of the defined types were placed in this encounter.  Orders Placed This Encounter  Procedures   EKG 12-Lead   ECHOCARDIOGRAM COMPLETE    Standing Status:   Future    Standing Expiration Date:   11/26/2023    Order Specific Question:   Where should this test be performed    Answer:   [facility])    Order Specific Question:   Does the patient weigh less than or greater than 250 lbs?    Answer:   Patient weighs less than 250 lbs    Order Specific Question:   Perflutren DEFINITY (image enhancing agent) should be administered unless hypersensitivity or allergy exist    Answer:   Administer Perflutren    Order Specific Question:   Reason for exam-Echo    Answer:   Dyspnea  R06.00    Order Specific Question:   Reason for exam-Echo    Answer:   Aortic regurgitation I35.1    Recommendations:   Martin Stephenson is a 34 y.o. 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  1. Coronary artery disease of native artery of native heart with stable angina pectoris [facility]) Fortunately patient has not had any recurrence of angina pectoris although he complains of gradually worsening dyspnea.  I suspect this is related to probably deconditioning as well as underlying anemia and do not suspect progression of coronary disease. - EKG 12-Lead - ECHOCARDIOGRAM COMPLETE; Future  2. Dyspnea on exertion As dictated above, do not suspect acute diastolic heart failure, even with chronic dyspnea, prior BNP was normal, no  JVD, lungs are clear.  Advised him to increase his physical activity to improve his conditioning as well. - ECHOCARDIOGRAM COMPLETE; Future  3. Essential hypertension Blood pressure is well-controlled.  Patient does have stage IIIa chronic kidney disease that has remained stable as well.  4. Moderate aortic regurgitation He does have moderate aortic regurgitation, do not think that this has gotten any worse however as it has been 2 years and prior to him seeing me in a year, would like to repeat echocardiogram. - ECHOCARDIOGRAM COMPLETE; Future  5. Mixed hyperlipidemia I reviewed his labs from the [place], lipids are under good control.  Continue present statin therapy.  6. Macrocytic anemia Patient has chronic anemia, I do not know the etiology for the same.  I have advised the patient to follow-up with his PCP regarding management of microcytic anemia that was noted on recent CBC from the [facility].  Otherwise stable from cardiac standpoint, chronic leg edema has remained stable, I will see him back on annual basis.    Yates Decamp, PA-C 08/27/2023, 6:54 PM Office: [phone]

## 2023-09-12 ENCOUNTER — Ambulatory Visit (HOSPITAL_COMMUNITY): Payer: Medicare Other | Attending: Cardiology

## 2023-09-12 DIAGNOSIS — I351 Nonrheumatic aortic (valve) insufficiency: Secondary | ICD-10-CM | POA: Diagnosis not present

## 2023-09-12 DIAGNOSIS — R0609 Other forms of dyspnea: Secondary | ICD-10-CM | POA: Insufficient documentation

## 2023-09-12 DIAGNOSIS — I25118 Atherosclerotic heart disease of native coronary artery with other forms of angina pectoris: Secondary | ICD-10-CM | POA: Insufficient documentation

## 2023-09-12 LAB — ECHOCARDIOGRAM COMPLETE
Area-P 1/2: 4.04 cm2
P 1/2 time: 751 msec
S' Lateral: 2.9 cm

## 2023-09-20 NOTE — Progress Notes (Signed)
Normal heart function. No change in aortic regurgitation whichis mild.  Aortic valve is between the main chamber of the heart and the aorta. Its job is to prevent blood leaking back into the heart when the heart has finished pumping the blood out and is relaxing.  ar= Aortic regurgitation. This valve is leaking blood back to to the left heart. It is mostly not very improtant unless it is moderate or severe. Most AR (aortic regurgitation) remains very stable for years. Valve needs to be replaced only for severe AR.  Advice him to continue to exercise regularly

## 2023-09-21 ENCOUNTER — Other Ambulatory Visit: Payer: Self-pay | Admitting: Cardiology

## 2023-09-21 DIAGNOSIS — N1831 Chronic kidney disease, stage 3a: Secondary | ICD-10-CM

## 2023-09-21 DIAGNOSIS — I25118 Atherosclerotic heart disease of native coronary artery with other forms of angina pectoris: Secondary | ICD-10-CM

## 2023-09-23 ENCOUNTER — Telehealth: Payer: Self-pay | Admitting: Cardiology

## 2023-09-23 NOTE — Telephone Encounter (Signed)
Returned pts call. Results given. Pt asked for copy of results. Verified mailing address and will send out copy today. 09/23/23

## 2023-09-23 NOTE — Telephone Encounter (Signed)
Patient calling for results to his echo

## 2023-11-22 ENCOUNTER — Other Ambulatory Visit: Payer: Self-pay | Admitting: Cardiology

## 2023-12-23 ENCOUNTER — Ambulatory Visit: Payer: Self-pay | Admitting: Cardiology

## 2024-01-18 ENCOUNTER — Ambulatory Visit (HOSPITAL_COMMUNITY)
Admission: EM | Admit: 2024-01-18 | Discharge: 2024-01-18 | Disposition: A | Discharge status: Home / Self Care | Payer: Medicare Other | Attending: Physician Assistant | Admitting: Physician Assistant

## 2024-01-18 ENCOUNTER — Encounter (HOSPITAL_COMMUNITY): Payer: Self-pay

## 2024-01-18 DIAGNOSIS — J069 Acute upper respiratory infection, unspecified: Secondary | ICD-10-CM

## 2024-01-18 DIAGNOSIS — Z20822 Contact with and (suspected) exposure to covid-19: Secondary | ICD-10-CM | POA: Diagnosis not present

## 2024-01-18 NOTE — ED Provider Notes (Signed)
[facility]   MRN: [id number] DOB: [birth year]  Subjective:   Martin Stephenson is a 83 y.o. male presenting for cough, fatigue, nasal congestion, chest congestion for the last 5 days.  Symptoms started on Monday, January 20.  His symptoms have stayed consistent throughout the week.  His wife fell ill this week with similar symptoms and came to the urgent care, testing positive for COVID-19, and she was started on Paxlovid.  Patient reports that he has been taking Tylenol, Mucinex, gargling with salt water and Chloraseptic.  Denies any chest pain or shortness of breath on exam with me today. No other symptoms at this time.   No current facility-administered medications for this encounter.  Current Outpatient Medications:    Albuterol Sulfate (PROAIR RESPICLICK) 108 (90 Base) MCG/ACT AEPB, 2 puffs into the lungs 4x a day PRN, Disp: , Rfl:    allopurinol (ZYLOPRIM) 300 MG tablet, Take 300 mg by mouth daily. , Disp: , Rfl:    amLODipine (NORVASC) 5 MG tablet, TAKE 1 TABLET BY MOUTH EVERY DAY, Disp: 90 tablet, Rfl: 3   aspirin 81 MG tablet, Take 81 mg by mouth daily., Disp: , Rfl:    Calcium Carb-Cholecalciferol (CALCIUM 600/VITAMIN D3 PO), Take 1 tablet by mouth daily. , Disp: , Rfl:    ferrous sulfate 325 (65 FE) MG tablet, Take 325 mg by mouth every Monday, Wednesday, and Friday., Disp: , Rfl:    fluticasone (FLONASE) 50 MCG/ACT nasal spray, Place 1 spray into both nostrils daily., Disp: , Rfl:    isosorbide dinitrate (ISORDIL) 30 MG tablet, TAKE 1 TABLET BY MOUTH 2 TIMES DAILY., Disp: 180 tablet, Rfl: 3   losartan (COZAAR) 25 MG tablet, TAKE 1 TABLET BY MOUTH EVERY DAY IN THE EVENING, Disp: 90 tablet, Rfl: 3   nebivolol (BYSTOLIC) 5 MG tablet, TAKE 1 TABLET (5 MG TOTAL) BY MOUTH DAILY., Disp: 90 tablet, Rfl: 2   nitroGLYCERIN (NITROSTAT) 0.4 MG SL tablet, DISSOLVE ONE TABLET UNDER THE TONGUE  AS NEEDED FOR CHEST PAIN (REPEAT EVERY 5 MINUTES IF NEEDED FOR A TOTAL OF 3 TABLETS IN  15 MINUTES. IF NO RELIEF, CALL 911.), Disp: , Rfl:    rosuvastatin (CRESTOR) 10 MG tablet, TAKE 1 TABLET BY MOUTH EVERY DAY, Disp: 90 tablet, Rfl: 3   sodium chloride (OCEAN) 0.65 % nasal spray, Place into the nose as needed., Disp: , Rfl:    torsemide (DEMADEX) 20 MG tablet, Take 1 tablet (20 mg total) by mouth daily as needed (Leg swelling)., Disp: 90 tablet, Rfl: 2   Allergies  Allergen Reactions   Labetalol Itching   Aleve [Naproxen Sodium]     Patient has been instructed to avoid Ibuprofen b/c of his Creatinine level.   Ibuprofen Other (See Comments)    Patient has been instructed to avoid Ibuprofen b/c of his Creatinine level.   Furacin [Nitrofurazone] Rash    Past Medical History:  Diagnosis Date   Allergy    Anemia    Arthritis    foot   Congestive heart failure (HCC)    Constipation    uses mag citrate OTC if needed   GERD (gastroesophageal reflux disease)    Hiatal hernia    Hyperlipidemia    Hypertension    Kidney stone    Sleep apnea    wears cpap   Squamous cell cancer of external ear, right    Tubular adenoma of colon 05/2017     Past Surgical History:  Procedure Laterality  Date   CARDIAC CATHETERIZATION     CERVICAL DISCECTOMY  11/2021   COLONOSCOPY     CORONARY ATHERECTOMY N/A 03/14/2021   Procedure: CORONARY ATHERECTOMY;  Surgeon: Yates Decamp, MD;  Location: [facility] INVASIVE CV LAB;  Service: Cardiovascular;  Laterality: N/A;   CORONARY BALLOON ANGIOPLASTY N/A 03/14/2021   Procedure: CORONARY BALLOON ANGIOPLASTY;  Surgeon: Yates Decamp, MD;  Location: [facility] INVASIVE CV LAB;  Service: Cardiovascular;  Laterality: N/A;   CORONARY STENT INTERVENTION N/A 03/14/2021   Procedure: CORONARY STENT INTERVENTION;  Surgeon: Yates Decamp, MD;  Location: [facility] INVASIVE CV LAB;  Service: Cardiovascular;  Laterality: N/A;   CORONARY ULTRASOUND/IVUS N/A 03/14/2021   Procedure: Intravascular Ultrasound/IVUS;  Surgeon: Yates Decamp, MD;  Location[facility] INVASIVE CV LAB;  Service:  Cardiovascular;  Laterality: N/A;   HAMMER TOE SURGERY  1998   1st toe right foot   hydrocelectomy  1988   NECK SURGERY     POLYPECTOMY     RIGHT/LEFT HEART CATH AND CORONARY ANGIOGRAPHY N/A 03/14/2021   Procedure: RIGHT/LEFT HEART CATH AND CORONARY ANGIOGRAPHY;  Surgeon: Yates Decamp, MD;  Location: [facility] INVASIVE CV LAB;  Service: Cardiovascular;  Laterality: N/A;   TONSILLECTOMY  1988    Family History  Problem Relation Age of Onset   Colon cancer Mother    Hypertension Sister    Hypertension Sister    Hypertension Brother    Stomach cancer Neg Hx    Colon polyps Neg Hx    Esophageal cancer Neg Hx    Rectal cancer Neg Hx     Social History   Tobacco Use   Smoking status: Never   Smokeless tobacco: Never  Vaping Use   Vaping status: Never Used  Substance Use Topics   Alcohol use: No   Drug use: No    ROS REFER TO HPI FOR PERTINENT POSITIVES AND NEGATIVES   Objective:   Vitals: BP (!) 157/86 (BP Location: Left Arm)   Pulse 85   Temp 100.2 F (37.9 C) (Oral)   Resp 16   SpO2 96%   Physical Exam Vitals and nursing note reviewed.  Constitutional:      General: He is not in acute distress.    Appearance: Normal appearance. He is not ill-appearing.  HENT:     Head: Normocephalic.     Right Ear: Tympanic membrane, ear canal and external ear normal.     Left Ear: Tympanic membrane, ear canal and external ear normal.     Nose: Congestion present.     Mouth/Throat:     Mouth: Mucous membranes are moist.     Pharynx: No oropharyngeal exudate or posterior oropharyngeal erythema.  Eyes:     Extraocular Movements: Extraocular movements intact.     Conjunctiva/sclera: Conjunctivae normal.     Pupils: Pupils are equal, round, and reactive to light.  Cardiovascular:     Rate and Rhythm: Normal rate and regular rhythm.     Pulses: Normal pulses.     Heart sounds: Normal heart sounds. No murmur heard. Pulmonary:     Effort: Pulmonary effort is normal. No respiratory  distress.     Breath sounds: Normal breath sounds. No decreased breath sounds or wheezing.  Musculoskeletal:     Cervical back: Normal range of motion.  Skin:    General: Skin is warm.  Neurological:     Mental Status: He is alert and oriented to person, place, and time.  Psychiatric:        Mood and Affect: Mood normal.  Behavior: Behavior normal.     No results found for this or any previous visit (from the past 24 hours).  Assessment and Plan :   PDMP not reviewed this encounter.  1. Exposure to COVID-19 virus   2. URI with cough and congestion    Patient with symptoms of COVID-19, with direct exposure to COVID-19.  Unfortunately, he is at day 6 of symptoms, therefore we discussed that the benefits do not outweigh the potential risk of initiating Paxlovid at this time.  We also discussed testing for COVID-19, and both agreed to defer testing at this time as it does not change his treatment plan, and again he does have a direct exposure from his wife.  He most likely has COVID-19.  Plan to treat conservatively at this time at home.  Fluids, rest, Tylenol.  He cannot take ibuprofen.  He will monitor his symptoms closely and follow-up with his PCP next week.  Strict ER precautions discussed.    AllwardtCrist Infante, PA-C 01/18/24 1439

## 2024-01-18 NOTE — ED Triage Notes (Signed)
Patient reports that he began having a cough, nasal congestion, chest congestion, and SOB x 3 days.  Patient states his wife was diagnosed with Covid 3 days ago.  Patient states he has been taking Tylenol, Mucinex and gargling with Chloraseptic and salt water.

## 2024-01-18 NOTE — Discharge Instructions (Addendum)
You have symptoms of COVID-19 and most likely have the virus as your wife tested positive for this.  Therefore, testing was deferred today.  It is recommended that you stay at home and rest.  Stay well-hydrated.  You may take Coricidin HBP over-the-counter to help with cough and congestion.  You may take Tylenol as needed for body aches and chills.  Monitor your symptoms and vital signs at home.  If suddenly worse, present to the emergency room.  Otherwise, check in with your primary care provider next week with an update how you are doing.  Please feel better soon.

## 2024-02-10 NOTE — Progress Notes (Unsigned)
Cardiology Office Note:  .   Date:  02/11/2024  ID:  Martin Stephenson, DOB 1941-08-06, MRN [id number] PCP: Gaspar Garbe, MD  [facility] Providers Cardiologist:  Yates Decamp, MD   History of Present Illness: .   Martin Stephenson is a 83 y.o. 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  Over the past 8 to 12 months he is continue to have worsening dyspnea and leg edema.  Discussed the use of AI scribe software for clinical note transcription with the patient, who gave verbal consent to proceed.  History of Present Illness   The patient, with a history of heart failure and sleep apnea, presents with shortness of breath, fatigue, and significant leg swelling. The patient reports feeling "out of gas" and experiences noticeable shortness of breath upon exertion, even doing routine activities.  The patient also reports sleeping for an average of four hours per night, often in a recliner after waking to use the bathroom. The patient uses a CPAP machine for sleep apnea and reports that it seems to be working well.  The patient has been taking torsemide as needed for fluid retention, typically for three consecutive days when swelling is noticeable. The patient reports that the medication does help with the swelling to some extent.  He is accompanied by his wife.    Labs   Lab Results  Component Value Date   NA 139 10/30/2022   K 4.3 10/30/2022   CO2 25 10/30/2022   GLUCOSE 89 10/30/2022   BUN 19 10/30/2022   CREATININE 1.35 (H) 10/30/2022   CALCIUM 9.2  10/30/2022   EGFR 39 (L) 04/16/2022   GFRNONAA 53 (L) 10/30/2022      Latest Ref Rng & Units 10/30/2022    4:10 PM 04/16/2022   12:58 PM 04/09/2022   12:49 PM  BMP  Glucose 70 - 99 mg/dL 89  97  409   BUN 8 - 23 mg/dL 19  27  26    Creatinine 0.61 - 1.24 mg/dL 8.11  9.14  7.82   BUN/Creat Ratio 10 - 24  15  16    Sodium 135 - 145 mmol/L 139  147  146   Potassium 3.5 - 5.1 mmol/L 4.3  4.5  4.5   Chloride 98 - 111 mmol/L 106  107  108   CO2 22 - 32 mmol/L 25  24  22    Calcium 8.9 - 10.3 mg/dL 9.2  9.7  9.1       Latest Ref Rng & Units 10/30/2022    4:10 PM 03/14/2021    8:17 AM 03/14/2021    8:10 AM  CBC  WBC 4.0 - 10.5 K/uL 6.2     Hemoglobin 13.0 - 17.0 g/dL 95.6  21.3  08.6    57.8   Hematocrit 39.0 - 52.0 % 34.5  32.0  33.0    33.0   Platelets 150 - 400 K/uL 207      External Labs:  PCP Labs 04/15/2023:   A1c 6.1%.  TSH was normal at 1.02.   BUN 20, creatinine 1.3, sodium 143, potassium 4.1, EGFR 56  mL.  LFTs normal.   Hb 10.8/HCT 32.7, mild macrocytosis present.  Platelets 163.  Agraffe labs 10/16/2022:   Total cholesterol 118, triglycerides 73, HDL 47, LDL 56.  Review of Systems  Cardiovascular:  Positive for dyspnea on exertion and leg swelling. Negative for chest pain.   Physical Exam:   VS:  BP 138/70 (BP Location: Left Arm, Patient Position: Sitting, Cuff Size: Large)   Pulse 73   Resp 16   Ht 6\' 3"  (1.905 m)   Wt 243 lb 12.8 oz (110.6 kg)   SpO2 97%   BMI 30.47 kg/m    Wt Readings from Last 3 Encounters:  02/11/24 243 lb 12.8 oz (110.6 kg)  08/27/23 234 lb 12.8 oz (106.5 kg)  12/20/22 234 lb (106.1 kg)    Physical Exam Neck:     Vascular: No carotid bruit, hepatojugular reflux or JVD.  Cardiovascular:     Rate and Rhythm: Normal rate and regular rhythm.     Pulses: Intact distal pulses.     Heart sounds: S1 normal and S2 normal. Murmur heard.     Early systolic murmur is present with a grade of 2/6 at the upper right sternal border.     No gallop.   Pulmonary:     Effort: Pulmonary effort is normal.     Breath sounds: Normal breath sounds.  Abdominal:     General: Bowel sounds are normal.     Palpations: Abdomen is soft.  Musculoskeletal:     Right lower leg: Edema (2-3+ above knee R>L) present.     Left lower leg: Edema (2-3+ above knee R>L) present.    Studies Reviewed: .    Right and left heart catheterization 03/14/2021:  Normal right heart catheterization with preserved cardiac output and cardiac index. LV: Mild decrease in LVEF, global hypokinesis, EF from 45% to at most 50%.  No significant mitral regurgitation.  Normal EDP.  No pressure gradient across the aortic valve. RCA: Large vessel, with mild diffuse disease. Left main: Large vessel, distal left main has 20 to 30% stenosis.  Mild calcification is evident. LAD: Large-caliber vessel giving origin to moderate to large size D1 and D2.  After the origin of a large D1, there is a 70%, appears to be high-grade intracranial views, mildly calcified lesion noted.  Otherwise there is mild disease.  IVUS revealing 67% stenosis with a minimum luminal area of 5.81 mm.  Lesion left alone. Circumflex: Very large caliber vessel giving origin to 2 large marginals.  The OM1 has proximal 30 to 40% stenosis.  Ostium of the circumflex has a calcific 70 or high-grade stenosis. IVUS confirmed high-grade stenosis, 4.0 x 4.0 mm vessel, tightest segment 1.7 by 1.7.  Minimum lumen 2.35 mm.  Post PCI lumen area 14.58 mm.  A 4.0 x 8 mm resolute Onyx DES was deployed extending into the distal left main and left main stent was optimized with a 5.0 x 8 mm balloon.  IVUS confirmed excellent apposition of the stent. Balloon angioplasty to ostial LAD to open up the stent struts with a 3.5 x 8 mm balloon.  IVUS confirmed no significant stent distortion.       ECHOCARDIOGRAM COMPLETE 09/12/2023  1. Left ventricular ejection fraction, by estimation, is 60 to 65%. The left ventricle has normal function. The  left ventricle has no regional wall motion abnormalities. There is moderate concentric left ventricular hypertrophy. Left ventricular diastolic parameters are consistent with Grade I diastolic dysfunction (impaired relaxation). The average left  ventricular global longitudinal strain is -18.0 %. The global longitudinal strain is normal. 2. Right ventricular systolic function is normal. The right ventricular size is normal. Tricuspid regurgitation signal is inadequate for assessing PA pressure. 3. The mitral valve is normal in structure. Mild to moderate mitral valve regurgitation. 4. The aortic valve is tricuspid. Aortic valve regurgitation is mild to moderate. 5. The inferior vena cava is normal in size with greater than 50% respiratory variability, suggesting right atrial pressure of 3 mmHg. 6.  Compared to the study done on 03/22/2022, no significant change.   EKG:    EKG Interpretation Date/Time:  Tuesday February 11 2024 10:10:58 EST Ventricular Rate:  72 PR Interval:  182 QRS Duration:  88 QT Interval:  380 QTC Calculation: 416 R Axis:   20  Text Interpretation: EKG 02/11/2024: Normal sinus rhythm at rate of 72 bpm, normal EKG.  Compared to 03/14/2021, no significant change. Confirmed by Delrae Rend [phone]) on 02/11/2024 10:44:03 AM    EKG 08/27/2023: Normal sinus rhythm at rate of 63 bpm, normal axis, single PVC   Medications and allergies    Allergies  Allergen Reactions   Labetalol Itching   Aleve [Naproxen Sodium]     Patient has been instructed to avoid Ibuprofen b/c of his Creatinine level.   Ibuprofen Other (See Comments)    Patient has been instructed to avoid Ibuprofen b/c of his Creatinine level.   Furacin [Nitrofurazone] Rash     Current Outpatient Medications:    Albuterol Sulfate (PROAIR RESPICLICK) 108 (90 Base) MCG/ACT AEPB, 2 puffs into the lungs 4x a day PRN, Disp: , Rfl:    allopurinol (ZYLOPRIM) 300 MG tablet, Take 300 mg by mouth daily. , Disp: , Rfl:     amLODipine (NORVASC) 5 MG tablet, TAKE 1 TABLET BY MOUTH EVERY DAY, Disp: 90 tablet, Rfl: 3   aspirin 81 MG tablet, Take 81 mg by mouth daily., Disp: , Rfl:    Calcium Carb-Cholecalciferol (CALCIUM 600/VITAMIN D3 PO), Take 1 tablet by mouth daily. , Disp: , Rfl:    cyanocobalamin (VITAMIN B12) 500 MCG tablet, Take 500 mcg by mouth daily., Disp: , Rfl:    ferrous sulfate 325 (65 FE) MG tablet, Take 325 mg by mouth every Monday, Wednesday, and Friday., Disp: , Rfl:    fluticasone (FLONASE) 50 MCG/ACT nasal spray, Place 1 spray into both nostrils daily., Disp: , Rfl:    isosorbide dinitrate (ISORDIL) 30 MG tablet, TAKE 1 TABLET BY MOUTH 2 TIMES DAILY., Disp: 180 tablet, Rfl: 3   losartan (COZAAR) 25 MG tablet, TAKE 1 TABLET BY MOUTH EVERY DAY IN THE EVENING, Disp: 90 tablet, Rfl: 3   nebivolol (BYSTOLIC) 5 MG tablet, TAKE 1 TABLET (5 MG TOTAL) BY MOUTH DAILY., Disp: 90 tablet, Rfl: 2   nitroGLYCERIN (NITROSTAT) 0.4 MG SL tablet, DISSOLVE ONE TABLET UNDER THE TONGUE  AS NEEDED FOR CHEST PAIN (REPEAT EVERY 5 MINUTES IF NEEDED FOR A TOTAL OF 3 TABLETS IN 15 MINUTES. IF NO RELIEF, CALL 911.), Disp: , Rfl:    rosuvastatin (CRESTOR) 10 MG tablet, TAKE 1 TABLET BY MOUTH EVERY DAY, Disp: 90 tablet, Rfl: 3   sodium chloride (OCEAN) 0.65 % nasal spray, Place into the nose as needed., Disp: , Rfl:    tamsulosin (FLOMAX) 0.4 MG CAPS capsule, Take 1 capsule by mouth every evening., Disp: , Rfl:    torsemide (DEMADEX) 10 MG tablet, Take 1 tablet (10 mg total) by mouth daily as needed. 1-2 tab daily except Tue  and thur as directed, Disp: 110 tablet, Rfl: 3   ASSESSMENT AND PLAN: .      ICD-10-CM   1. Chronic diastolic congestive heart failure (HCC)  I50.32 EKG 12-Lead    torsemide (DEMADEX) 10 MG tablet    Basic Metabolic Panel (BMET)    Pro b natriuretic peptide    2. Dyspnea on exertion  R06.09 Basic Metabolic Panel (BMET)    Pro b natriuretic peptide    3. Bilateral leg edema  R60.0 torsemide (DEMADEX)  10 MG tablet    Basic Metabolic Panel (BMET)    Pro b natriuretic peptide    4. Coronary artery disease of native artery of native heart with stable angina pectoris (HCC)  I25.118 Basic Metabolic Panel (BMET)    5. Essential hypertension  I10 Basic Metabolic Panel (BMET)      Assessment and Plan    Chronic Diastolic Heart Failure/Dyspnea on exertion Significant leg swelling and dyspnea on exertion are present, with an echocardiogram showing normal systolic function, confirming diastolic heart failure. Intermittent torsemide use has provided some relief. Impaired myocardial relaxation leads to fluid accumulation.  Do not suspect progression of coronary disease, today except for almost a 2-3+ bilateral leg edema all the way up to his thighs, lungs are clear.  He also endorses continued dyspnea on exertion and wonder whether this is just deconditioning or a competent of heart failure contributing to his symptoms although these have been ongoing for the past 2 to 3 years.  CAD without angina/Hypertension Patient has had coronary angiography in 2022 and angioplasty and since then his EF had improved from 45% to the present normal by echocardiogram just about <6 months ago.  Do not think he needs repeat stress testing or repeat echocardiogram.  His blood pressure is also well-controlled and presently on low-dose of losartan 25 mg daily along with 5 mg of Bystolic and 5 mg amlodipine.  Could consider increasing Bystolic and discontinuing amlodipine to see whether his leg edema would improve if symptoms are not improved.  Consistent torsemide use is recommended to manage fluid retention and improve symptoms, with potential risks of renal strain and dehydration, but benefits include reduced edema and improved breathing.  Patient was previously utilizing 20 mg of torsemide on a as needed basis approximately 3 days a week, prescribe torsemide 10 mg, 1-2 tablet daily except Tuesdays and Thursdays. Monitor leg  swelling and adjust torsemide dosage accordingly. Avoid excessive fluid intake while on torsemide.  Decrease the dose if no significant edema or shortness of breath is stable or skip the dose.   Recommend leg compresses to aid fluid circulation. Order blood work in 3-4 weeks to monitor renal function and medication response.  General Health Maintenance Emphasize the importance of monitoring symptoms and adjusting medication as needed. Provide a prescription for torsemide with 100 tablets and 3 refills.   Follow-up Order blood work in 3-4 weeks to monitor renal function and medication response, OV in 6 months.      Signed,  Yates Decamp, MD, [facility] 02/11/2024, 8:06 PM [facility] [address] [place], [place] [address] Phone: [phone]. Fax:  [phone]

## 2024-02-11 ENCOUNTER — Ambulatory Visit: Payer: Medicare Other | Attending: Cardiology | Admitting: Cardiology

## 2024-02-11 ENCOUNTER — Encounter: Payer: Self-pay | Admitting: Cardiology

## 2024-02-11 VITALS — BP 138/70 | HR 73 | Resp 16 | Ht 75.0 in | Wt 243.8 lb

## 2024-02-11 DIAGNOSIS — I25118 Atherosclerotic heart disease of native coronary artery with other forms of angina pectoris: Secondary | ICD-10-CM

## 2024-02-11 DIAGNOSIS — I351 Nonrheumatic aortic (valve) insufficiency: Secondary | ICD-10-CM

## 2024-02-11 DIAGNOSIS — I5032 Chronic diastolic (congestive) heart failure: Secondary | ICD-10-CM

## 2024-02-11 DIAGNOSIS — R0609 Other forms of dyspnea: Secondary | ICD-10-CM | POA: Diagnosis not present

## 2024-02-11 DIAGNOSIS — R600 Localized edema: Secondary | ICD-10-CM

## 2024-02-11 DIAGNOSIS — I10 Essential (primary) hypertension: Secondary | ICD-10-CM

## 2024-02-11 MED ORDER — TORSEMIDE 10 MG PO TABS
10.0000 mg | ORAL_TABLET | Freq: Every day | ORAL | 3 refills | Status: AC | PRN
Start: 2024-02-11 — End: ?

## 2024-02-11 NOTE — Patient Instructions (Addendum)
Medication Instructions:  Your physician has recommended you make the following change in your medication: Change torsemide to 10 mg by mouth daily except do not take on Tues or Thurs  *If you need a refill on your cardiac medications before your next appointment, please call your pharmacy*   Lab Work: Have BMP and BNP checked at any [facility] in one month.  This not fasting.  There is an office on the first floor of our building If you have labs (blood work) drawn today and your tests are completely normal, you will receive your results only by: MyChart Message (if you have MyChart) OR A paper copy in the mail If you have any lab test that is abnormal or we need to change your treatment, we will call you to review the results.   Testing/Procedures: none   Follow-Up: At [facility], you and your health needs are our priority.  As part of our continuing mission to provide you with exceptional heart care, we have created designated Provider Care Teams.  These Care Teams include your primary Cardiologist (physician) and Advanced Practice Providers (APPs -  Physician Assistants and Nurse Practitioners) who all work together to provide you with the care you need, when you need it.  We recommend signing up for the patient portal called "MyChart".  Sign up information is provided on this After Visit Summary.  MyChart is used to connect with patients for Virtual Visits (Telemedicine).  Patients are able to view lab/test results, encounter notes, upcoming appointments, etc.  Non-urgent messages can be sent to your provider as well.   To learn more about what you can do with MyChart, go to [email or web address].    Your next appointment:   6 month(s)  Provider:   Yates Decamp, MD     Other Instructions

## 2024-03-11 LAB — BASIC METABOLIC PANEL
BUN/Creatinine Ratio: 18 (ref 10–24)
BUN: 29 mg/dL — ABNORMAL HIGH (ref 8–27)
CO2: 23 mmol/L (ref 20–29)
Calcium: 8.9 mg/dL (ref 8.6–10.2)
Chloride: 107 mmol/L — ABNORMAL HIGH (ref 96–106)
Creatinine, Ser: 1.59 mg/dL — ABNORMAL HIGH (ref 0.76–1.27)
Glucose: 86 mg/dL (ref 70–99)
Potassium: 4.1 mmol/L (ref 3.5–5.2)
Sodium: 145 mmol/L — ABNORMAL HIGH (ref 134–144)
eGFR: 44 mL/min/{1.73_m2} — ABNORMAL LOW (ref >59)

## 2024-03-11 LAB — PRO B NATRIURETIC PEPTIDE: NT-Pro BNP: 74 pg/mL (ref 0–486)

## 2024-03-11 NOTE — Progress Notes (Signed)
 Renal function is very stable after initiation of the diuretic therapy. Low NT BNP suggests the fluid load is not from heart failure but dependent edema. Support stocking and keeping legs raised up should help

## 2024-07-31 ENCOUNTER — Emergency Department (HOSPITAL_COMMUNITY)

## 2024-07-31 ENCOUNTER — Observation Stay (HOSPITAL_COMMUNITY)
Admission: EM | Admit: 2024-07-31 | Discharge: 2024-08-02 | Disposition: A | Discharge status: Home / Self Care | Attending: Internal Medicine | Admitting: Internal Medicine

## 2024-07-31 ENCOUNTER — Other Ambulatory Visit: Payer: Self-pay

## 2024-07-31 ENCOUNTER — Encounter (HOSPITAL_COMMUNITY): Payer: Self-pay

## 2024-07-31 DIAGNOSIS — N1832 Chronic kidney disease, stage 3b: Secondary | ICD-10-CM | POA: Diagnosis not present

## 2024-07-31 DIAGNOSIS — I130 Hypertensive heart and chronic kidney disease with heart failure and stage 1 through stage 4 chronic kidney disease, or unspecified chronic kidney disease: Secondary | ICD-10-CM | POA: Diagnosis not present

## 2024-07-31 DIAGNOSIS — M4802 Spinal stenosis, cervical region: Secondary | ICD-10-CM | POA: Diagnosis not present

## 2024-07-31 DIAGNOSIS — I2699 Other pulmonary embolism without acute cor pulmonale: Secondary | ICD-10-CM | POA: Diagnosis not present

## 2024-07-31 DIAGNOSIS — Z85828 Personal history of other malignant neoplasm of skin: Secondary | ICD-10-CM | POA: Insufficient documentation

## 2024-07-31 DIAGNOSIS — G4733 Obstructive sleep apnea (adult) (pediatric): Secondary | ICD-10-CM | POA: Insufficient documentation

## 2024-07-31 DIAGNOSIS — E785 Hyperlipidemia, unspecified: Secondary | ICD-10-CM | POA: Diagnosis not present

## 2024-07-31 DIAGNOSIS — N400 Enlarged prostate without lower urinary tract symptoms: Secondary | ICD-10-CM | POA: Diagnosis not present

## 2024-07-31 DIAGNOSIS — R600 Localized edema: Secondary | ICD-10-CM | POA: Insufficient documentation

## 2024-07-31 DIAGNOSIS — I2510 Atherosclerotic heart disease of native coronary artery without angina pectoris: Secondary | ICD-10-CM | POA: Diagnosis not present

## 2024-07-31 DIAGNOSIS — I5032 Chronic diastolic (congestive) heart failure: Secondary | ICD-10-CM | POA: Insufficient documentation

## 2024-07-31 DIAGNOSIS — Z7982 Long term (current) use of aspirin: Secondary | ICD-10-CM | POA: Insufficient documentation

## 2024-07-31 DIAGNOSIS — Z79899 Other long term (current) drug therapy: Secondary | ICD-10-CM | POA: Diagnosis not present

## 2024-07-31 DIAGNOSIS — I10 Essential (primary) hypertension: Secondary | ICD-10-CM

## 2024-07-31 DIAGNOSIS — M542 Cervicalgia: Secondary | ICD-10-CM | POA: Insufficient documentation

## 2024-07-31 DIAGNOSIS — R0609 Other forms of dyspnea: Secondary | ICD-10-CM | POA: Diagnosis present

## 2024-07-31 LAB — BASIC METABOLIC PANEL WITH GFR
Anion gap: 11 (ref 5–15)
BUN: 22 mg/dL (ref 8–23)
CO2: 25 mmol/L (ref 22–32)
Calcium: 9.4 mg/dL (ref 8.9–10.3)
Chloride: 106 mmol/L (ref 98–111)
Creatinine, Ser: 1.64 mg/dL — ABNORMAL HIGH (ref 0.61–1.24)
GFR, Estimated: 42 mL/min — ABNORMAL LOW (ref >60)
Glucose, Bld: 148 mg/dL — ABNORMAL HIGH (ref 70–99)
Potassium: 3.8 mmol/L (ref 3.5–5.1)
Sodium: 142 mmol/L (ref 135–145)

## 2024-07-31 LAB — CBC
HCT: 34 % — ABNORMAL LOW (ref 39.0–52.0)
Hemoglobin: 10.9 g/dL — ABNORMAL LOW (ref 13.0–17.0)
MCH: 32.7 pg (ref 26.0–34.0)
MCHC: 32.1 g/dL (ref 30.0–36.0)
MCV: 102.1 fL — ABNORMAL HIGH (ref 80.0–100.0)
Platelets: 165 K/uL (ref 150–400)
RBC: 3.33 MIL/uL — ABNORMAL LOW (ref 4.22–5.81)
RDW: 14.9 % (ref 11.5–15.5)
WBC: 7.7 K/uL (ref 4.0–10.5)
nRBC: 0 % (ref 0.0–0.2)

## 2024-07-31 LAB — BRAIN NATRIURETIC PEPTIDE: B Natriuretic Peptide: 20.5 pg/mL (ref 0.0–100.0)

## 2024-07-31 LAB — TROPONIN I (HIGH SENSITIVITY): Troponin I (High Sensitivity): 8 ng/L (ref <18)

## 2024-07-31 MED ORDER — LOSARTAN POTASSIUM 25 MG PO TABS
25.0000 mg | ORAL_TABLET | Freq: Every day | ORAL | Status: DC
  Administered 2024-07-31 – 2024-08-01 (×2): 25 mg via ORAL
  Filled 2024-07-31 (×2): qty 1

## 2024-07-31 MED ORDER — ACETAMINOPHEN 325 MG PO TABS
650.0000 mg | ORAL_TABLET | Freq: Four times a day (QID) | ORAL | Status: DC | PRN
  Administered 2024-07-31: 650 mg via ORAL
  Filled 2024-07-31: qty 2

## 2024-07-31 MED ORDER — AMLODIPINE BESYLATE 5 MG PO TABS: 5.0000 mg | ORAL_TABLET | Freq: Every day | ORAL | Status: DC

## 2024-07-31 MED ORDER — OXYCODONE HCL 5 MG PO TABS: 5.0000 mg | ORAL_TABLET | ORAL | Status: DC | PRN

## 2024-07-31 MED ORDER — IOHEXOL 350 MG/ML SOLN
75.0000 mL | Freq: Once | INTRAVENOUS | Status: AC | PRN
  Administered 2024-07-31: 75 mL via INTRAVENOUS

## 2024-07-31 MED ORDER — TAMSULOSIN HCL 0.4 MG PO CAPS
0.4000 mg | ORAL_CAPSULE | Freq: Every evening | ORAL | Status: DC
  Administered 2024-07-31 – 2024-08-01 (×2): 0.4 mg via ORAL
  Filled 2024-07-31 (×2): qty 1

## 2024-07-31 MED ORDER — SENNOSIDES-DOCUSATE SODIUM 8.6-50 MG PO TABS: 1.0000 | ORAL_TABLET | Freq: Every evening | ORAL | Status: DC | PRN

## 2024-07-31 MED ORDER — ASPIRIN 81 MG PO CHEW
81.0000 mg | CHEWABLE_TABLET | Freq: Every day | ORAL | Status: DC
  Administered 2024-08-01 – 2024-08-02 (×2): 81 mg via ORAL
  Filled 2024-07-31 (×2): qty 1

## 2024-07-31 MED ORDER — ACETAMINOPHEN 650 MG RE SUPP: 650.0000 mg | Freq: Four times a day (QID) | RECTAL | Status: DC | PRN

## 2024-07-31 MED ORDER — HEPARIN BOLUS VIA INFUSION
5500.0000 [IU] | Freq: Once | INTRAVENOUS | Status: AC
  Administered 2024-07-31: 5500 [IU] via INTRAVENOUS
  Filled 2024-07-31: qty 5500

## 2024-07-31 MED ORDER — SODIUM CHLORIDE 0.9% FLUSH
3.0000 mL | Freq: Two times a day (BID) | INTRAVENOUS | Status: DC
  Administered 2024-07-31 – 2024-08-02 (×4): 3 mL via INTRAVENOUS

## 2024-07-31 MED ORDER — METHOCARBAMOL 500 MG PO TABS
500.0000 mg | ORAL_TABLET | Freq: Four times a day (QID) | ORAL | Status: DC | PRN
  Administered 2024-07-31: 500 mg via ORAL
  Filled 2024-07-31: qty 1

## 2024-07-31 MED ORDER — NEBIVOLOL HCL 5 MG PO TABS
5.0000 mg | ORAL_TABLET | Freq: Every day | ORAL | Status: DC
  Administered 2024-08-01 – 2024-08-02 (×2): 5 mg via ORAL
  Filled 2024-07-31 (×2): qty 1

## 2024-07-31 MED ORDER — GADOBUTROL 1 MMOL/ML IV SOLN
10.0000 mL | Freq: Once | INTRAVENOUS | Status: AC | PRN
  Administered 2024-07-31: 10 mL via INTRAVENOUS

## 2024-07-31 MED ORDER — ONDANSETRON HCL 4 MG/2ML IJ SOLN: 4.0000 mg | Freq: Four times a day (QID) | INTRAMUSCULAR | Status: DC | PRN

## 2024-07-31 MED ORDER — ISOSORBIDE DINITRATE 30 MG PO TABS
30.0000 mg | ORAL_TABLET | Freq: Two times a day (BID) | ORAL | Status: DC
  Administered 2024-07-31 – 2024-08-02 (×4): 30 mg via ORAL
  Filled 2024-07-31 (×5): qty 1

## 2024-07-31 MED ORDER — ALLOPURINOL 300 MG PO TABS
300.0000 mg | ORAL_TABLET | Freq: Every day | ORAL | Status: DC
  Administered 2024-08-01 – 2024-08-02 (×2): 300 mg via ORAL
  Filled 2024-07-31 (×2): qty 1

## 2024-07-31 MED ORDER — ROSUVASTATIN CALCIUM 5 MG PO TABS
10.0000 mg | ORAL_TABLET | Freq: Every day | ORAL | Status: DC
  Administered 2024-08-01 – 2024-08-02 (×2): 10 mg via ORAL
  Filled 2024-07-31 (×2): qty 2

## 2024-07-31 MED ORDER — ONDANSETRON HCL 4 MG PO TABS
4.0000 mg | ORAL_TABLET | Freq: Four times a day (QID) | ORAL | Status: DC | PRN
Start: 2024-07-31 — End: 2024-08-02

## 2024-07-31 MED ORDER — HEPARIN (PORCINE) 25000 UT/250ML-% IV SOLN
1650.0000 [IU]/h | INTRAVENOUS | Status: DC
  Administered 2024-07-31 – 2024-08-01 (×2): 1650 [IU]/h via INTRAVENOUS
  Filled 2024-07-31 (×2): qty 250

## 2024-07-31 NOTE — ED Provider Notes (Signed)
 [place] EMERGENCY DEPARTMENT AT [facility] Provider Note   CSN: [id number] Arrival date & time: 07/31/24  1051     Patient presents with: No chief complaint on file.   Martin Stephenson is a 83 y.o. male here with several concerns.  Pt has hx of CHF takes torsemide  20 mg daily.  He reports worsening leg swelling, dyspnea on exertion and orthopnea over the past few days.  Seperately he reports 5 days of worsening cervical neck pain, hurts when I swallow with tingling in both hands and some balance/gait difficulty.  He has had cervical spinal surgery in the past.  Denies fevers, cough, chest pain or pressure, or hx of DVT or PE   HPI     Prior to Admission medications   Medication Sig Start Date End Date Taking? Authorizing Provider  Albuterol Sulfate (PROAIR RESPICLICK) 108 (90 Base) MCG/ACT AEPB 2 puffs into the lungs 4x a day PRN   Yes [provider]  allopurinol  (ZYLOPRIM ) 300 MG tablet Take 300 mg by mouth daily.    Yes [provider]  amLODipine  (NORVASC ) 5 MG tablet TAKE 1 TABLET BY MOUTH EVERY DAY 06/24/23  Yes Ladona Heinz, MD  APIXABAN  (ELIQUIS ) VTE STARTER PACK (10MG  AND 5MG ) Take as directed on package: start with two-5mg  tablets twice daily for 7 days. On day 8, switch to one-5mg  tablet twice daily. 08/01/24  Yes Christobal Guadalajara, MD  aspirin  81 MG tablet Take 81 mg by mouth daily.   Yes [provider]  Calcium  Carb-Cholecalciferol (CALCIUM  600/VITAMIN D3 PO) Take 1 tablet by mouth daily.    Yes [provider]  ferrous sulfate  325 (65 FE) MG tablet Take 325 mg by mouth every Monday, Wednesday, and Friday. 10/28/20  Yes [provider]  fluticasone  (FLONASE ) 50 MCG/ACT nasal spray Place 1 spray into both nostrils daily as needed for allergies. 05/26/21  Yes [provider]  isosorbide  dinitrate (ISORDIL ) 30 MG tablet TAKE 1 TABLET BY MOUTH 2 TIMES DAILY. 12/27/22  Yes Ladona Heinz, MD  losartan  (COZAAR ) 25 MG tablet TAKE 1  TABLET BY MOUTH EVERY DAY IN THE EVENING 09/23/23  Yes Ladona Heinz, MD  nebivolol  (BYSTOLIC ) 5 MG tablet TAKE 1 TABLET (5 MG TOTAL) BY MOUTH DAILY. 11/25/23  Yes Ladona Heinz, MD  rosuvastatin  (CRESTOR ) 10 MG tablet TAKE 1 TABLET BY MOUTH EVERY DAY 08/20/23  Yes Ladona Heinz, MD  sodium chloride  (OCEAN) 0.65 % nasal spray Place 1 spray into the nose daily as needed for congestion.   Yes [provider]  tamsulosin  (FLOMAX ) 0.4 MG CAPS capsule Take 1 capsule by mouth every evening. 07/26/23  Yes [provider]  torsemide  (DEMADEX ) 10 MG tablet Take 1 tablet (10 mg total) by mouth daily as needed. 1-2 tab daily except Tue and thur as directed Patient taking differently: Take 10 mg by mouth daily as needed (for fluid). 02/11/24  Yes Ladona Heinz, MD  cyanocobalamin  (VITAMIN B12) 500 MCG tablet Take 500 mcg by mouth daily. 04/15/23   [provider]  nitroGLYCERIN  (NITROSTAT ) 0.4 MG SL tablet Place 0.4 mg under the tongue every 5 (five) minutes as needed for chest pain. 04/16/22   [provider]    Allergies: Labetalol , Aleve [naproxen sodium], Ibuprofen , and Furacin [nitrofurazone]    Review of Systems  Updated Vital Signs BP 131/66 (BP Location: Left Arm)   Pulse 63   Temp 99.8 F (37.7 C) (Oral)   Resp 16   Ht 6' 4 (  1.93 m)   Wt 103.1 kg   SpO2 97%   BMI 27.67 kg/m   Physical Exam Constitutional:      General: He is not in acute distress. HENT:     Head: Normocephalic and atraumatic.  Eyes:     Conjunctiva/sclera: Conjunctivae normal.     Pupils: Pupils are equal, round, and reactive to light.  Cardiovascular:     Rate and Rhythm: Normal rate and regular rhythm.     Heart sounds: Murmur heard.  Pulmonary:     Effort: Pulmonary effort is normal. No respiratory distress.  Abdominal:     General: There is no distension.     Tenderness: There is no abdominal tenderness.  Musculoskeletal:     Right lower leg: Edema present.     Left lower leg: Edema  present.     Comments: Significant pitting edema of lower extremities, symmterical  Skin:    General: Skin is warm and dry.  Neurological:     General: No focal deficit present.     Mental Status: He is alert. Mental status is at baseline.     Comments: Grip and finger and upper extremity strength testing normal Isolated lower extremity strength testing normal (mildly reduced given degree of edema); paresthesias reported in bilateral hands  Psychiatric:        Mood and Affect: Mood normal.        Behavior: Behavior normal.     (all labs ordered are listed, but only abnormal results are displayed) Labs Reviewed  BASIC METABOLIC PANEL WITH GFR - Abnormal; Notable for the following components:      Result Value   Glucose, Bld 148 (*)    Creatinine, Ser 1.64 (*)    GFR, Estimated 42 (*)    All other components within normal limits  CBC - Abnormal; Notable for the following components:   RBC 3.33 (*)    Hemoglobin 10.9 (*)    HCT 34.0 (*)    MCV 102.1 (*)    All other components within normal limits  HEPARIN  LEVEL (UNFRACTIONATED) - Abnormal; Notable for the following components:   Heparin  Unfractionated >1.10 (*)    All other components within normal limits  BASIC METABOLIC PANEL WITH GFR - Abnormal; Notable for the following components:   Glucose, Bld 151 (*)    BUN 24 (*)    Creatinine, Ser 1.53 (*)    GFR, Estimated 45 (*)    All other components within normal limits  CBC - Abnormal; Notable for the following components:   RBC 3.25 (*)    Hemoglobin 10.5 (*)    HCT 32.6 (*)    MCV 100.3 (*)    All other components within normal limits  HEPARIN  LEVEL (UNFRACTIONATED) - Abnormal; Notable for the following components:   Heparin  Unfractionated >1.10 (*)    All other components within normal limits  MRSA NEXT GEN BY PCR, NASAL  BRAIN NATRIURETIC PEPTIDE  HEPARIN  LEVEL (UNFRACTIONATED)  TROPONIN I (HIGH SENSITIVITY)    EKG: EKG Interpretation Date/Time:  Friday July 31 2024 10:59:27 EDT Ventricular Rate:  86 PR Interval:  170 QRS Duration:  86 QT Interval:  356 QTC Calculation: 426 R Axis:   23  Text Interpretation: Normal sinus rhythm Nonspecific T wave abnormality Abnormal ECG When compared with ECG of 11-Feb-2024 10:10, PREVIOUS ECG IS PRESENT Confirmed by Cottie Cough [phone]) on 07/31/2024 3:07:21 PM  Radiology: CT Angio Chest PE W and/or Wo Contrast Result Date: 07/31/2024 CLINICAL DATA:  Shortness of breath and chest pain, initial encounter EXAM: CT ANGIOGRAPHY CHEST WITH CONTRAST TECHNIQUE: Multidetector CT imaging of the chest was performed using the standard protocol during bolus administration of intravenous contrast. Multiplanar CT image reconstructions and MIPs were obtained to evaluate the vascular anatomy. RADIATION DOSE REDUCTION: This exam was performed according to the departmental dose-optimization program which includes automated exposure control, adjustment of the mA and/or kV according to patient size and/or use of iterative reconstruction technique. CONTRAST:  75mL OMNIPAQUE  IOHEXOL  350 MG/ML SOLN COMPARISON:  Chest x-ray from earlier in the same day, CT from 10/31/2022 FINDINGS: Cardiovascular: Thoracic aorta shows atherosclerotic calcifications without aneurysmal dilatation. The pulmonary artery shows a normal branching pattern bilaterally. Multiple filling defects are identified within the pulmonary artery bilaterally consistent with pulmonary embolism. These are primarily in the segmental and sub segmental level. No right heart strain is noted. Mediastinum/Nodes: Thoracic inlet is within normal limits. No hilar or mediastinal adenopathy is noted. The esophagus as visualized is within normal limits. Lungs/Pleura: Lungs are well aerated bilaterally. No focal infiltrate or sizable effusion is seen. No parenchymal nodules are noted. Upper Abdomen: Visualized upper abdomen shows no acute abnormality. Musculoskeletal: No chest wall abnormality.  No acute or significant osseous findings. Postsurgical changes are noted in the cervical spine. Review of the MIP images confirms the above findings. IMPRESSION: Bilateral segmental and subsegmental pulmonary emboli primarily within the lower lobes without evidence of right heart strain. No other acute abnormality noted. Aortic Atherosclerosis (ICD10-I70.0). Critical Value/emergent results were called by telephone at the time of interpretation on 07/31/2024 at 7:54 pm to Dr. DONNICE HUGHES , who verbally acknowledged these results. Electronically Signed   By: Oneil Devonshire M.D.   On: 07/31/2024 19:55   MR Cervical Spine W and Wo Contrast Result Date: 07/31/2024 EXAM: MRI CERVICAL SPINE WITHOUT AND WITH CONTRAST 07/31/2024 06:37:56 PM TECHNIQUE: Multiplanar multisequence MRI of the cervical spine was performed without and with the administration of intravenous contrast. COMPARISON: MRI cervical spine dated 03/14/2021. CLINICAL HISTORY: Neck pain with paresthesias of the arm and numbness in feet; history of spinal surgery - evaluation for demyelinating lesion vs cord impingement. FINDINGS: BONES AND ALIGNMENT: Straightening and reversal of the normal cervical lordosis similar to prior. Grade 1 anterolisthesis of C4 on C5. Anterior cervical fusion hardware spanning C4 to C6. Artifact from hardware slightly limits evaluation of the bone marrow. Within this limitation, the bone marrow signal intensity is unremarkable without edema or evidence of fracture. SPINAL CORD: Similar appearance of focal T2 hyperintensity within the cord at C4-5 with volume loss and decreased AP diameter of the cord suggestive of myelomalacia. No abnormal enhancement in the cord. The visualized posterior fossa is unremarkable. SOFT TISSUES: There is mild edema in the paraspinal soft tissues adjacent to the left facets at C3-4. C2-C3: There is a central disc protrusion which appears slightly increased which indents the ventral thecal sac without  contacting the spinal cord. Thickening of the ligamentum flavum. Mild spinal canal stenosis. Facet arthrosis and uncovertebral hypertrophy with mild bilateral foraminal stenosis slightly increased. C3-C4: There is a disc osteophyte complex and central disc protrusion which indents the ventral thecal sac and contacts the ventral cervical cord with increased flattening of the cord compared to prior. Thickening of the ligamentum flavum. Mild-to-moderate spinal canal stenosis increased from prior. Bilateral facet arthrosis and uncovertebral hypertrophy with moderate bilateral foraminal stenosis. C4-C5: Postsurgical changes at C4-5 with posterior osteophytes indenting the ventral thecal sac with flattening of the ventral cervical cord. The  previously noted disc protrusion is not visualized on the current study. There is slightly decreased compression of the cord compared to prior. Thickening of the ligamentum flavum. 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 Bilateral facet arthrosis and uncovertebral hypertrophy. Moderate-to-severe bilateral foraminal stenosis which appears slightly improved. C5-C6: Surgical changes at C5-6. Disc osteophyte complex eccentric to the right which indents the ventral thecal sac with mild flattening of the ventral cord. Thickening of the ligamentum flavum. Mild spinal canal stenosis. Bilateral facet arthrosis and uncovertebral hypertrophy resulting in mild bilateral foraminal stenosis. C6-C7: There is a small disc osteophyte complex eccentric to the right which indents the ventral thecal sac without contacting the spinal cord. Thickening of the ligamentum flavum. Mild spinal canal stenosis. Bilateral facet arthrosis and uncovertebral hypertrophy. Mild right foraminal stenosis. C7-T1: There is a diffuse disc bulge which indents the ventral thecal sac. Thickening of the ligamentum flavum. Mild spinal canal stenosis which is increased from prior. Bilateral facet arthrosis with severe bilateral  foraminal stenosis. IMPRESSION: 1. Interval ACDF at C4-C6. 2. Previously noted disc protrusion at C4-5 is not present on the current study. Posterior osteophytes result in residual flattening of the ventral cord. Cord compression at C4-5 is overall improved from prior. 3. 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 4. Small facet effusion on the left at C3-4 with mild edema in the adjacent soft tissues, likely related to degenerative changes. Superimposed infection cannot be excluded. 5. Multilevel degenerative changes as detailed above. Foraminal stenosis greatest at C4-5 and C7-T1. Spinal canal stenosis at C3-4 and at C7-T1 is slightly increased. Electronically signed by: Donnice Mania MD 07/31/2024 07:12 PM EDT RP Workstation: [id number]   DG Chest 2 View Result Date: 07/31/2024 CLINICAL DATA:  Shortness of breath. EXAM: CHEST - 2 VIEW COMPARISON:  None Available. FINDINGS: The heart size and mediastinal contours are within normal limits. Both lungs are clear. Mild thoracolumbar dextroscoliosis noted. IMPRESSION: No active cardiopulmonary disease. Electronically Signed   By: Norleen DELENA Kil M.D.   On: 07/31/2024 11:34     .Critical Care  Performed by: Cottie Donnice PARAS, MD Authorized by: Cottie Donnice PARAS, MD   Critical care provider statement:    Critical care time (minutes):  30   Critical care time was exclusive of:  Separately billable procedures and treating other patients   Critical care was necessary to treat or prevent imminent or life-threatening deterioration of the following conditions:  Circulatory failure   Critical care was time spent personally by me on the following activities:  Ordering and performing treatments and interventions, ordering and review of laboratory studies, ordering and review of radiographic studies, pulse oximetry, review of old charts, examination of patient and evaluation of patient's response to treatment    Medications  Ordered in the ED  allopurinol  (ZYLOPRIM ) tablet 300 mg (300 mg Oral Given 08/01/24 0818)  aspirin  chewable tablet 81 mg (81 mg Oral Given 08/01/24 0817)  isosorbide  dinitrate (ISORDIL ) tablet 30 mg (30 mg Oral Given 08/01/24 0817)  losartan  (COZAAR ) tablet 25 mg (25 mg Oral Given 07/31/24 2211)  nebivolol  (BYSTOLIC ) tablet 5 mg (5 mg Oral Given 08/01/24 0817)  rosuvastatin  (CRESTOR ) tablet 10 mg (10 mg Oral Given 08/01/24 0818)  tamsulosin  (FLOMAX ) capsule 0.4 mg (0.4 mg Oral Given 07/31/24 2211)  sodium chloride  flush (NS) 0.9 % injection 3 mL (3 mLs Intravenous Given 08/01/24 0819)  acetaminophen  (TYLENOL ) tablet 650 mg (650 mg Oral Given 07/31/24 2211)    Or  acetaminophen  (TYLENOL ) suppository 650  mg ( Rectal See Alternative 07/31/24 2211)  oxyCODONE  (Oxy IR/ROXICODONE ) immediate release tablet 5-10 mg (has no administration in time range)  senna-docusate (Senokot-S) tablet 1 tablet (has no administration in time range)  methocarbamol  (ROBAXIN ) tablet 500 mg (500 mg Oral Given 07/31/24 2211)  ondansetron  (ZOFRAN ) tablet 4 mg (has no administration in time range)    Or  ondansetron  (ZOFRAN ) injection 4 mg (has no administration in time range)  cyanocobalamin  (VITAMIN B12) tablet 500 mcg (500 mcg Oral Given 08/01/24 1205)  ferrous sulfate  tablet 325 mg (has no administration in time range)  fluticasone  (FLONASE ) 50 MCG/ACT nasal spray 1 spray (has no administration in time range)  torsemide  (DEMADEX ) tablet 10 mg (10 mg Oral Given 08/01/24 1205)  heparin  ADULT infusion 100 units/mL (25000 units/250mL) (1,350 Units/hr Intravenous New Bag/Given 08/01/24 1343)  gadobutrol  (GADAVIST ) 1 MMOL/ML injection 10 mL (10 mLs Intravenous Contrast Given 07/31/24 1838)  iohexol  (OMNIPAQUE ) 350 MG/ML injection 75 mL (75 mLs Intravenous Contrast Given 07/31/24 1946)  heparin  bolus via infusion 5,500 Units (5,500 Units Intravenous Bolus from Bag 07/31/24 2030)    Clinical Course as of 08/01/24 1425  Fri Jul 31, 2024  2007 Acute PE on  CT imaging - no right heart strain, trop normal.  Will start on heparin  and admit. Pt and family updated.  MRI C spine reviewed as well - there is some cord impingement on 2 levels, which may be contributing to his arm parethesias, but this can be followed up outpatient; no motor weakness. Consider some steroids and ortho spine f/u [MT]  2113 Admitted to hospitalist [MT]    Clinical Course User Index [MT] Quinnlyn Hearns, Donnice PARAS, MD                                 Medical Decision Making Amount and/or Complexity of Data Reviewed Labs: ordered. Radiology: ordered.  Risk Prescription drug management. Decision regarding hospitalization.   This patient presents to the ED with concern for SOB, tingling in hands. This involves an extensive number of treatment options, and is a complaint that carries with it a high risk of complications and morbidity.  The differential diagnosis includes cervical injury vs PNA vs PE vs PTX vs anemia vs CHF vs other  Co-morbidities that complicate the patient evaluation: hx of CHF  Additional history obtained from family   I ordered and personally interpreted labs.  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  I personally viewed and interpreted the cardiac monitored which showed an underlying rhythm of: regular HR  Per my interpretation the patient's ECG shows no acute ischemia  I ordered medication including heparin  for acute PE  I have reviewed the patients home medicines and have made adjustments as needed  Test Considered: doubt meningitis  After the interventions noted above, I reevaluated the patient and found that they  have: stayed the same    Dispostion:  After consideration of the diagnostic results and the patients response to treatment, I feel that the patent would benefit from medical admission      Final diagnoses:  Other pulmonary embolism without acute cor pulmonale, unspecified chronicity (HCC)  ED Discharge Orders          Ordered    APIXABAN  (ELIQUIS ) VTE STARTER PACK (10MG  AND 5MG )       Note to Pharmacy: If starter pack unavailable, substitute with seventy-four 5 mg apixaban  tabs following the above SIG directions.   08/01/24 1035               Cottie Donnice PARAS, MD 08/01/24 1425

## 2024-07-31 NOTE — ED Notes (Signed)
 2c called and alerted that pt is on way up

## 2024-07-31 NOTE — ED Triage Notes (Signed)
 Pt bib POV c/o of left sided neck pain, bilateral hand tingling, and generalized weakness that started earlier this week.  Pt endorses sob  Pt denies headache, CP, and blurred vision.  Pt has shooting pain when turning head to left.  Pt swelling in legs are getting worse.  Hx CHF HTN   Compliant with medications

## 2024-07-31 NOTE — ED Notes (Signed)
 Pt returned from CT

## 2024-07-31 NOTE — ED Notes (Signed)
 Pt states the last couple of months he has felt off balanced as well.

## 2024-07-31 NOTE — ED Notes (Addendum)
 Patient transported to MRI

## 2024-07-31 NOTE — ED Notes (Signed)
 Pt returned from MRI

## 2024-07-31 NOTE — ED Notes (Signed)
 Patient transported to CT

## 2024-07-31 NOTE — H&P (Signed)
 History and Physical    Martin Stephenson FMW:[id number] DOB: [birth year] DOA: 07/31/2024  PCP: Vernadine Charlie ORN, MD   Patient coming from: Home   Chief Complaint: SOB, leg swelling, neck pain   HPI: Martin Stephenson is a 83 y.o. male with medical history significant for hypertension, CKD 3B, cervical spinal stenosis, CAD, chronic HFpEF, and OSA on CPAP who presents with increasing shortness of breath, increased bilateral lower extremity edema, and increased neck pain.  Patient reports insidiously worsening bilateral lower extremity edema over the course of weeks to months.  He has also noted worsening shortness of breath without cough, fever, or chills.  He denies any tenderness in the lower extremities and denies chest pain.  He denies any history of bleeding problems.  There has not been any prolonged immobilization recently, injury, or new medications.  ED Course: Upon arrival to the ED, patient is found to be afebrile and saturating well on room air with normal HR and stable BP.  Labs are most notable for creatinine 1.64, normal WBC, normal troponin, and normal BNP.  ED workup reveals bilateral segmental and subsegmental PE without evidence for heart strain on CT.  Patient was started on IV heparin  in the ED.  Review of Systems:  All other systems reviewed and apart from HPI, are negative.  Past Medical History:  Diagnosis Date   Allergy     Anemia    Arthritis    foot   CKD stage 3b, GFR 30-44 ml/min (HCC) 07/31/2024   Congestive heart failure (HCC)    Constipation    uses mag citrate OTC if needed   GERD (gastroesophageal reflux disease)    Hiatal hernia    Hyperlipidemia    Hypertension    Kidney stone    Sleep apnea    wears cpap   Squamous cell cancer of external ear, right    Tubular adenoma of colon 05/2017    Past Surgical History:  Procedure Laterality Date   CARDIAC CATHETERIZATION     CERVICAL DISCECTOMY  11/2021   COLONOSCOPY     CORONARY ATHERECTOMY N/A  03/14/2021   Procedure: CORONARY ATHERECTOMY;  Surgeon: Ladona Heinz, MD;  Location: [facility] INVASIVE CV LAB;  Service: Cardiovascular;  Laterality: N/A;   CORONARY BALLOON ANGIOPLASTY N/A 03/14/2021   Procedure: CORONARY BALLOON ANGIOPLASTY;  Surgeon: Ladona Heinz, MD;  Location: [facility] INVASIVE CV LAB;  Service: Cardiovascular;  Laterality: N/A;   CORONARY STENT INTERVENTION N/A 03/14/2021   Procedure: CORONARY STENT INTERVENTION;  Surgeon: Ladona Heinz, MD;  Location: [facility] INVASIVE CV LAB;  Service: Cardiovascular;  Laterality: N/A;   CORONARY ULTRASOUND/IVUS N/A 03/14/2021   Procedure: Intravascular Ultrasound/IVUS;  Surgeon: Ladona Heinz, MD;  Location: [facility] INVASIVE CV LAB;  Service: Cardiovascular;  Laterality: N/A;   HAMMER TOE SURGERY  1998   1st toe right foot   hydrocelectomy  1988   NECK SURGERY     POLYPECTOMY     RIGHT/LEFT HEART CATH AND CORONARY ANGIOGRAPHY N/A 03/14/2021   Procedure: RIGHT/LEFT HEART CATH AND CORONARY ANGIOGRAPHY;  Surgeon: Ladona Heinz, MD;  Location: [facility] INVASIVE CV LAB;  Service: Cardiovascular;  Laterality: N/A;   TONSILLECTOMY  1988    Social History:   reports that he has never smoked. He has never used smokeless tobacco. He reports that he does not drink alcohol and does not use drugs.  Allergies  Allergen Reactions   Labetalol  Itching   Aleve [Naproxen Sodium]     Patient has been instructed to avoid Ibuprofen  b/c  of his Creatinine level.   Ibuprofen  Other (See Comments)    Patient has been instructed to avoid Ibuprofen  b/c of his Creatinine level.   Furacin [Nitrofurazone] Rash    Family History  Problem Relation Age of Onset   Colon cancer Mother    Hypertension Sister    Hypertension Sister    Hypertension Brother    Stomach cancer Neg Hx    Colon polyps Neg Hx    Esophageal cancer Neg Hx    Rectal cancer Neg Hx      Prior to Admission medications   Medication Sig Start Date End Date Taking? Authorizing Provider  Albuterol Sulfate (PROAIR RESPICLICK)  108 (90 Base) MCG/ACT AEPB 2 puffs into the lungs 4x a day PRN   Yes [provider]  allopurinol  (ZYLOPRIM ) 300 MG tablet Take 300 mg by mouth daily.    Yes [provider]  amLODipine  (NORVASC ) 5 MG tablet TAKE 1 TABLET BY MOUTH EVERY DAY 06/24/23  Yes Ladona Heinz, MD  aspirin  81 MG tablet Take 81 mg by mouth daily.   Yes [provider]  Calcium  Carb-Cholecalciferol (CALCIUM  600/VITAMIN D3 PO) Take 1 tablet by mouth daily.    Yes [provider]  ferrous sulfate 325 (65 FE) MG tablet Take 325 mg by mouth every Monday, Wednesday, and Friday. 10/28/20  Yes [provider]  fluticasone (FLONASE) 50 MCG/ACT nasal spray Place 1 spray into both nostrils daily as needed for allergies. 05/26/21  Yes [provider]  isosorbide  dinitrate (ISORDIL ) 30 MG tablet TAKE 1 TABLET BY MOUTH 2 TIMES DAILY. 12/27/22  Yes Ladona Heinz, MD  losartan  (COZAAR ) 25 MG tablet TAKE 1 TABLET BY MOUTH EVERY DAY IN THE EVENING 09/23/23  Yes Ladona Heinz, MD  nebivolol  (BYSTOLIC ) 5 MG tablet TAKE 1 TABLET (5 MG TOTAL) BY MOUTH DAILY. 11/25/23  Yes Ladona Heinz, MD  rosuvastatin  (CRESTOR ) 10 MG tablet TAKE 1 TABLET BY MOUTH EVERY DAY 08/20/23  Yes Ladona Heinz, MD  sodium chloride  (OCEAN) 0.65 % nasal spray Place 1 spray into the nose daily as needed for congestion.   Yes [provider]  tamsulosin  (FLOMAX ) 0.4 MG CAPS capsule Take 1 capsule by mouth every evening. 07/26/23  Yes [provider]  torsemide  (DEMADEX ) 10 MG tablet Take 1 tablet (10 mg total) by mouth daily as needed. 1-2 tab daily except Tue and thur as directed Patient taking differently: Take 10 mg by mouth daily as needed (for fluid). 02/11/24  Yes Ladona Heinz, MD  cyanocobalamin (VITAMIN B12) 500 MCG tablet Take 500 mcg by mouth daily. 04/15/23   [provider]  nitroGLYCERIN  (NITROSTAT ) 0.4 MG SL tablet Place 0.4 mg under the tongue every 5 (five) minutes as needed for chest pain. 04/16/22   [provider]    Physical Exam: Vitals:   07/31/24 1445 07/31/24 1600 07/31/24 1853 07/31/24 2000  BP: (!) 151/74 (!) 160/90 (!) 165/78 (!) 168/77  Pulse: 65 (!) 58 68 71  Resp: 18 15 19  (!) 27  Temp:   98 F (36.7 C)   TempSrc:   Oral   SpO2: 100% 100% 99% 100%  Weight:      Height:        Constitutional: NAD, calm  Eyes: PERTLA, lids and conjunctivae normal ENMT: Mucous membranes are moist. Posterior pharynx clear of any exudate or lesions.   Neck: supple, no masses  Respiratory: no wheezing, no crackles. No accessory muscle use.  Cardiovascular: S1 & S2 heard,  regular rate and rhythm. Marked bilateral lower extremity edema.   Abdomen: No tenderness, soft. Bowel sounds active.  Musculoskeletal: no clubbing / cyanosis. No joint deformity upper and lower extremities.   Skin: no significant rashes, lesions, ulcers. Warm, dry, well-perfused. Neurologic: CN 2-12 grossly intact. Moving all extremities. Alert and oriented.  Psychiatric: Pleasant. Cooperative.    Labs and Imaging on Admission: I have personally reviewed following labs and imaging studies  CBC: Recent Labs  Lab 07/31/24 1104  WBC 7.7  HGB 10.9*  HCT 34.0*  MCV 102.1*  PLT 165   Basic Metabolic Panel: Recent Labs  Lab 07/31/24 1104  NA 142  K 3.8  CL 106  CO2 25  GLUCOSE 148*  BUN 22  CREATININE 1.64*  CALCIUM  9.4   GFR: Estimated Creatinine Clearance: 43.4 mL/min (A) (by C-G formula based on SCr of 1.64 mg/dL (H)). Liver Function Tests: No results for input(s): AST, ALT, ALKPHOS, BILITOT, PROT, ALBUMIN in the last 168 hours. No results for input(s): LIPASE, AMYLASE in the last 168 hours. No results for input(s): AMMONIA in the last 168 hours. Coagulation Profile: No results for input(s): INR, PROTIME in the last 168 hours. Cardiac Enzymes: No results for input(s): CKTOTAL, CKMB, CKMBINDEX, TROPONINI in the last 168 hours. BNP (last 3 results) Recent Labs     03/10/24 1201  PROBNP 74   HbA1C: No results for input(s): HGBA1C in the last 72 hours. CBG: No results for input(s): GLUCAP in the last 168 hours. Lipid Profile: No results for input(s): CHOL, HDL, LDLCALC, TRIG, CHOLHDL, LDLDIRECT in the last 72 hours. Thyroid  Function Tests: No results for input(s): TSH, T4TOTAL, FREET4, T3FREE, THYROIDAB in the last 72 hours. Anemia Panel: No results for input(s): VITAMINB12, FOLATE, FERRITIN, TIBC, IRON, RETICCTPCT in the last 72 hours. Urine analysis:    Component Value Date/Time   COLORURINE YELLOW 10/30/2022 1406   APPEARANCEUR CLEAR 10/30/2022 1406   LABSPEC 1.018 10/30/2022 1406   PHURINE 5.0 10/30/2022 1406   GLUCOSEU NEGATIVE 10/30/2022 1406   HGBUR SMALL (A) 10/30/2022 1406   BILIRUBINUR NEGATIVE 10/30/2022 1406   KETONESUR NEGATIVE 10/30/2022 1406   PROTEINUR 30 (A) 10/30/2022 1406   NITRITE NEGATIVE 10/30/2022 1406   LEUKOCYTESUR NEGATIVE 10/30/2022 1406   Sepsis Labs: @LABRCNTIP (procalcitonin:4,lacticidven:4) )No results found for this or any previous visit (from the past 240 hours).   Radiological Exams on Admission: CT Angio Chest PE W and/or Wo Contrast Result Date: 07/31/2024 CLINICAL DATA:  Shortness of breath and chest pain, initial encounter EXAM: CT ANGIOGRAPHY CHEST WITH CONTRAST TECHNIQUE: Multidetector CT imaging of the chest was performed using the standard protocol during bolus administration of intravenous contrast. Multiplanar CT image reconstructions and MIPs were obtained to evaluate the vascular anatomy. RADIATION DOSE REDUCTION: This exam was performed according to the departmental dose-optimization program which includes automated exposure control, adjustment of the mA and/or kV according to patient size and/or use of iterative reconstruction technique. CONTRAST:  75mL OMNIPAQUE  IOHEXOL  350 MG/ML SOLN COMPARISON:  Chest x-ray from earlier in the same day, CT from 10/31/2022  FINDINGS: Cardiovascular: Thoracic aorta shows atherosclerotic calcifications without aneurysmal dilatation. The pulmonary artery shows a normal branching pattern bilaterally. Multiple filling defects are identified within the pulmonary artery bilaterally consistent with pulmonary embolism. These are primarily in the segmental and sub segmental level. No right heart strain is noted. Mediastinum/Nodes: Thoracic inlet is within normal limits. No hilar or mediastinal adenopathy is noted. The esophagus as visualized is within normal limits. Lungs/Pleura: Lungs are well  aerated bilaterally. No focal infiltrate or sizable effusion is seen. No parenchymal nodules are noted. Upper Abdomen: Visualized upper abdomen shows no acute abnormality. Musculoskeletal: No chest wall abnormality. No acute or significant osseous findings. Postsurgical changes are noted in the cervical spine. Review of the MIP images confirms the above findings. IMPRESSION: Bilateral segmental and subsegmental pulmonary emboli primarily within the lower lobes without evidence of right heart strain. No other acute abnormality noted. Aortic Atherosclerosis (ICD10-I70.0). Critical Value/emergent results were called by telephone at the time of interpretation on 07/31/2024 at 7:54 pm to Dr. DONNICE HUGHES , who verbally acknowledged these results. Electronically Signed   By: Oneil Devonshire M.D.   On: 07/31/2024 19:55   MR Cervical Spine W and Wo Contrast Result Date: 07/31/2024 EXAM: MRI CERVICAL SPINE WITHOUT AND WITH CONTRAST 07/31/2024 06:37:56 PM TECHNIQUE: Multiplanar multisequence MRI of the cervical spine was performed without and with the administration of intravenous contrast. COMPARISON: MRI cervical spine dated 03/14/2021. CLINICAL HISTORY: Neck pain with paresthesias of the arm and numbness in feet; history of spinal surgery - evaluation for demyelinating lesion vs cord impingement. FINDINGS: BONES AND ALIGNMENT: Straightening and reversal of the  normal cervical lordosis similar to prior. Grade 1 anterolisthesis of C4 on C5. Anterior cervical fusion hardware spanning C4 to C6. Artifact from hardware slightly limits evaluation of the bone marrow. Within this limitation, the bone marrow signal intensity is unremarkable without edema or evidence of fracture. SPINAL CORD: Similar appearance of focal T2 hyperintensity within the cord at C4-5 with volume loss and decreased AP diameter of the cord suggestive of myelomalacia. No abnormal enhancement in the cord. The visualized posterior fossa is unremarkable. SOFT TISSUES: There is mild edema in the paraspinal soft tissues adjacent to the left facets at C3-4. C2-C3: There is a central disc protrusion which appears slightly increased which indents the ventral thecal sac without contacting the spinal cord. Thickening of the ligamentum flavum. Mild spinal canal stenosis. Facet arthrosis and uncovertebral hypertrophy with mild bilateral foraminal stenosis slightly increased. C3-C4: There is a disc osteophyte complex and central disc protrusion which indents the ventral thecal sac and contacts the ventral cervical cord with increased flattening of the cord compared to prior. Thickening of the ligamentum flavum. Mild-to-moderate spinal canal stenosis increased from prior. Bilateral facet arthrosis and uncovertebral hypertrophy with moderate bilateral foraminal stenosis. C4-C5: Postsurgical changes at C4-5 with posterior osteophytes indenting the ventral thecal sac with flattening of the ventral cervical cord. The previously noted disc protrusion is not visualized on the current study. There is slightly decreased compression of the cord compared to prior. Thickening of the ligamentum flavum. 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 Bilateral facet arthrosis and uncovertebral hypertrophy. Moderate-to-severe bilateral foraminal stenosis which appears slightly improved. C5-C6: Surgical changes at C5-6. Disc osteophyte  complex eccentric to the right which indents the ventral thecal sac with mild flattening of the ventral cord. Thickening of the ligamentum flavum. Mild spinal canal stenosis. Bilateral facet arthrosis and uncovertebral hypertrophy resulting in mild bilateral foraminal stenosis. C6-C7: There is a small disc osteophyte complex eccentric to the right which indents the ventral thecal sac without contacting the spinal cord. Thickening of the ligamentum flavum. Mild spinal canal stenosis. Bilateral facet arthrosis and uncovertebral hypertrophy. Mild right foraminal stenosis. C7-T1: There is a diffuse disc bulge which indents the ventral thecal sac. Thickening of the ligamentum flavum. Mild spinal canal stenosis which is increased from prior. Bilateral facet arthrosis with severe bilateral foraminal stenosis. IMPRESSION: 1. Interval ACDF at C4-C6. 2. Previously  noted disc protrusion at C4-5 is not present on the current study. Posterior osteophytes result in residual flattening of the ventral cord. Cord compression at C4-5 is overall improved from prior. 3. 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 4. Small facet effusion on the left at C3-4 with mild edema in the adjacent soft tissues, likely related to degenerative changes. Superimposed infection cannot be excluded. 5. Multilevel degenerative changes as detailed above. Foraminal stenosis greatest at C4-5 and C7-T1. Spinal canal stenosis at C3-4 and at C7-T1 is slightly increased. Electronically signed by: Donnice Mania MD 07/31/2024 07:12 PM EDT RP Workstation: [id number]   DG Chest 2 View Result Date: 07/31/2024 CLINICAL DATA:  Shortness of breath. EXAM: CHEST - 2 VIEW COMPARISON:  None Available. FINDINGS: The heart size and mediastinal contours are within normal limits. Both lungs are clear. Mild thoracolumbar dextroscoliosis noted. IMPRESSION: No active cardiopulmonary disease. Electronically Signed   By: Norleen DELENA Kil  M.D.   On: 07/31/2024 11:34    EKG: Independently reviewed. Sinus rhythm.   Assessment/Plan   1. Pulmonary embolism  - Unclear precipitant, no heart strain on CT, cardiac enzymes normal, no supplemental O2 requirement  - Started on IV heparin  in ED will continue for now, check LE venous Dopplers, likely transition to oral anticoagulant in AM    2. Cervical spinal stenosis  - S/p ACDF in 2022  - Increased neck pain recently, no weakness or fevers/chills  - Outpatient follow-up with his neurosurgeon recommended   3. CKD 3B  - Appears close to baseline  - Renally-dose medications   4. Chronic HFpEF  - He has increased leg swelling with normal BNP, no JVD  - Monitor volume status    5. CAD  - No recent angina  - Continue ASA, statin, beta-blocker   6. Hypertension  - Norvasc , losartan , nebivolol     7. OSA  - CPAP while sleeping   DVT prophylaxis: IV heparin   Code Status: Full  Level of Care: Level of care: Telemetry Cardiac Family Communication: Wife and daughter at bedside  Disposition Plan:  Patient is from: Home Anticipated d/c is to: Home Anticipated d/c date is: 8/9 or 08/02/24  Patient currently: Pending LE venous Dopplers, transition to oral anticoagulant  Consults called: None  Admission status: Observation     Evalene GORMAN Sprinkles, MD [facility]  07/31/2024, 9:09 PM

## 2024-07-31 NOTE — ED Notes (Signed)
 Called CCMD at [phone] to initiate cardiac monitoring as ordered.

## 2024-07-31 NOTE — ED Notes (Signed)
 EDP at bedside discussing plan of care with pt and family. Pt is a/o x 4. OK to eat and drink per Dr. Cottie.

## 2024-07-31 NOTE — Progress Notes (Signed)
 ANTICOAGULATION CONSULT NOTE  Pharmacy Consult for Heparin  Indication: pulmonary embolus  Allergies  Allergen Reactions   Labetalol  Itching   Aleve [Naproxen Sodium]     Patient has been instructed to avoid Ibuprofen  b/c of his Creatinine level.   Ibuprofen  Other (See Comments)    Patient has been instructed to avoid Ibuprofen  b/c of his Creatinine level.   Furacin [Nitrofurazone] Rash    Patient Measurements: Height: 6' 4 (193 cm) Weight: 102.1 kg (225 lb) IBW/kg (Calculated) : 86.8 Heparin  Dosing Weight: 102.1 kg  Vital Signs: Temp: 98 F (36.7 C) (08/08 1853) Temp Source: Oral (08/08 1853) BP: 165/78 (08/08 1853) Pulse Rate: 68 (08/08 1853)  Labs: Recent Labs    07/31/24 1104 07/31/24 1600  HGB 10.9*  --   HCT 34.0*  --   PLT 165  --   CREATININE 1.64*  --   TROPONINIHS  --  8    Estimated Creatinine Clearance: 43.4 mL/min (A) (by C-G formula based on SCr of 1.64 mg/dL (H)).   Medical History: Past Medical History:  Diagnosis Date   Allergy     Anemia    Arthritis    foot   Congestive heart failure (HCC)    Constipation    uses mag citrate OTC if needed   GERD (gastroesophageal reflux disease)    Hiatal hernia    Hyperlipidemia    Hypertension    Kidney stone    Sleep apnea    wears cpap   Squamous cell cancer of external ear, right    Tubular adenoma of colon 05/2017    Medications:  (Not in a hospital admission)  Scheduled:  Infusions:  PRN:   Assessment: 83 yom with a history of HF. Patient is presenting with leg swelling and dyspnea. Heparin  per pharmacy consult placed for pulmonary embolus.  CTA w/ bilateral PE w/out CT evidence of RHS  Patient is not on anticoagulation prior to arrival.  Hgb 10.9; plt 165  Goal of Therapy:  Heparin  level 0.3-0.7 units/ml Monitor platelets by anticoagulation protocol: Yes   Plan:  Give IV heparin  5500 units bolus x 1 Start heparin  infusion at 1650 units/hr Check anti-Xa level in 8 hours  and daily while on heparin  Continue to monitor H&H and platelets  Dorn Buttner, PharmD, BCPS 07/31/2024 8:11 PM ED Clinical Pharmacist -  [phone]

## 2024-08-01 ENCOUNTER — Encounter (HOSPITAL_COMMUNITY)

## 2024-08-01 ENCOUNTER — Other Ambulatory Visit (HOSPITAL_COMMUNITY): Payer: Self-pay

## 2024-08-01 DIAGNOSIS — I2699 Other pulmonary embolism without acute cor pulmonale: Secondary | ICD-10-CM | POA: Diagnosis not present

## 2024-08-01 LAB — CBC
HCT: 32.6 % — ABNORMAL LOW (ref 39.0–52.0)
Hemoglobin: 10.5 g/dL — ABNORMAL LOW (ref 13.0–17.0)
MCH: 32.3 pg (ref 26.0–34.0)
MCHC: 32.2 g/dL (ref 30.0–36.0)
MCV: 100.3 fL — ABNORMAL HIGH (ref 80.0–100.0)
Platelets: 164 K/uL (ref 150–400)
RBC: 3.25 MIL/uL — ABNORMAL LOW (ref 4.22–5.81)
RDW: 14.6 % (ref 11.5–15.5)
WBC: 6.2 K/uL (ref 4.0–10.5)
nRBC: 0 % (ref 0.0–0.2)

## 2024-08-01 LAB — HEPARIN LEVEL (UNFRACTIONATED)
Heparin Unfractionated: >1.1 [IU]/mL — ABNORMAL HIGH (ref 0.30–0.70)
Heparin Unfractionated: >1.1 [IU]/mL — ABNORMAL HIGH (ref 0.30–0.70)
Heparin Unfractionated: 0.87 [IU]/mL — ABNORMAL HIGH (ref 0.30–0.70)

## 2024-08-01 LAB — BASIC METABOLIC PANEL WITH GFR
Anion gap: 10 (ref 5–15)
BUN: 24 mg/dL — ABNORMAL HIGH (ref 8–23)
CO2: 23 mmol/L (ref 22–32)
Calcium: 9 mg/dL (ref 8.9–10.3)
Chloride: 106 mmol/L (ref 98–111)
Creatinine, Ser: 1.53 mg/dL — ABNORMAL HIGH (ref 0.61–1.24)
GFR, Estimated: 45 mL/min — ABNORMAL LOW (ref >60)
Glucose, Bld: 151 mg/dL — ABNORMAL HIGH (ref 70–99)
Potassium: 3.8 mmol/L (ref 3.5–5.1)
Sodium: 139 mmol/L (ref 135–145)

## 2024-08-01 LAB — MRSA NEXT GEN BY PCR, NASAL: MRSA by PCR Next Gen: NOT DETECTED

## 2024-08-01 MED ORDER — FLUTICASONE PROPIONATE 50 MCG/ACT NA SUSP: 1.0000 | Freq: Every day | NASAL | Status: DC | PRN

## 2024-08-01 MED ORDER — FERROUS SULFATE 325 (65 FE) MG PO TABS: 325.0000 mg | ORAL_TABLET | ORAL | Status: DC

## 2024-08-01 MED ORDER — APIXABAN (ELIQUIS) VTE STARTER PACK (10MG AND 5MG)
ORAL_TABLET | ORAL | 0 refills | Status: DC
  Filled 2024-08-01: qty 74, 30d supply, fill #0

## 2024-08-01 MED ORDER — HEPARIN (PORCINE) 25000 UT/250ML-% IV SOLN
1250.0000 [IU]/h | INTRAVENOUS | Status: DC
  Administered 2024-08-01: 1350 [IU]/h via INTRAVENOUS
  Administered 2024-08-02: 1250 [IU]/h via INTRAVENOUS
  Filled 2024-08-01: qty 250

## 2024-08-01 MED ORDER — VITAMIN B-12 1000 MCG PO TABS
500.0000 ug | ORAL_TABLET | Freq: Every day | ORAL | Status: DC
  Administered 2024-08-01 – 2024-08-02 (×2): 500 ug via ORAL
  Filled 2024-08-01 (×2): qty 1

## 2024-08-01 MED ORDER — TORSEMIDE 20 MG PO TABS
10.0000 mg | ORAL_TABLET | Freq: Every day | ORAL | Status: DC
  Administered 2024-08-01 – 2024-08-02 (×2): 10 mg via ORAL
  Filled 2024-08-01 (×2): qty 1

## 2024-08-01 NOTE — Care Management Obs Status (Signed)
 MEDICARE OBSERVATION STATUS NOTIFICATION   Patient Details  Name: Martin Stephenson MRN: [id number] Date of Birth: 07-16-1941   Medicare Observation Status Notification Given:  Yes    Robynn Eileen Hoose, RN 08/01/2024, 8:00 AM

## 2024-08-01 NOTE — Progress Notes (Signed)
 PT Cancellation Note  Patient Details Name: TRENELL CONCANNON MRN: [id number] DOB: 1941/11/18   Cancelled Treatment:    Reason Eval/Treat Not Completed: Other (comment) (Pt in restroom. Waited 8 min; pt still in rest room. Will return as able and appropriate.)  Dorothyann Maier, DPT, CLT  Acute Rehabilitation Services Office: [phone] (Secure chat preferred)   Dorothyann VEAR Maier 08/01/2024, 2:31 PM

## 2024-08-01 NOTE — Plan of Care (Signed)
   Problem: Health Behavior/Discharge Planning: Goal: Ability to manage health-related needs will improve Outcome: Progressing   Problem: Clinical Measurements: Goal: Ability to maintain clinical measurements within normal limits will improve Outcome: Progressing   Problem: Clinical Measurements: Goal: Will remain free from infection Outcome: Progressing

## 2024-08-01 NOTE — Progress Notes (Signed)
 ANTICOAGULATION CONSULT NOTE  Pharmacy Consult for Heparin  Indication: pulmonary embolus  Allergies  Allergen Reactions   Labetalol  Itching   Aleve [Naproxen Sodium]     Patient has been instructed to avoid Ibuprofen  b/c of his Creatinine level.   Ibuprofen  Other (See Comments)    Patient has been instructed to avoid Ibuprofen  b/c of his Creatinine level.   Furacin [Nitrofurazone] Rash    Patient Measurements: Height: 6' 4 (193 cm) Weight: 103.1 kg (227 lb 4.7 oz) IBW/kg (Calculated) : 86.8 Heparin  Dosing Weight: 102.1 kg  Vital Signs: Temp: 97.9 F (36.6 C) (08/09 0743) Temp Source: Oral (08/09 0743) BP: 121/66 (08/09 0743) Pulse Rate: 67 (08/09 0743)  Labs: Recent Labs    07/31/24 1104 07/31/24 1600 08/01/24 0755 08/01/24 1013  HGB 10.9*  --  10.5*  --   HCT 34.0*  --  32.6*  --   PLT 165  --  164  --   HEPARINUNFRC  --   --  >1.10* >1.10*  CREATININE 1.64*  --  1.53*  --   TROPONINIHS  --  8  --   --     Estimated Creatinine Clearance: 46.5 mL/min (A) (by C-G formula based on SCr of 1.53 mg/dL (H)).   Medical History: Past Medical History:  Diagnosis Date   Allergy     Anemia    Arthritis    foot   CKD stage 3b, GFR 30-44 ml/min (HCC) 07/31/2024   Congestive heart failure (HCC)    Constipation    uses mag citrate OTC if needed   GERD (gastroesophageal reflux disease)    Hiatal hernia    Hyperlipidemia    Hypertension    Kidney stone    Sleep apnea    wears cpap   Squamous cell cancer of external ear, right    Tubular adenoma of colon 05/2017    Medications:  Medications Prior to Admission  Medication Sig Dispense Refill Last Dose/Taking   Albuterol Sulfate (PROAIR RESPICLICK) 108 (90 Base) MCG/ACT AEPB 2 puffs into the lungs 4x a day PRN   Past Month   allopurinol  (ZYLOPRIM ) 300 MG tablet Take 300 mg by mouth daily.    07/31/2024 Morning   amLODipine  (NORVASC ) 5 MG tablet TAKE 1 TABLET BY MOUTH EVERY DAY 90 tablet 3 07/31/2024 Morning   aspirin   81 MG tablet Take 81 mg by mouth daily.   07/31/2024 Morning   Calcium  Carb-Cholecalciferol (CALCIUM  600/VITAMIN D3 PO) Take 1 tablet by mouth daily.    07/31/2024 Morning   ferrous sulfate  325 (65 FE) MG tablet Take 325 mg by mouth every Monday, Wednesday, and Friday.   07/29/2024   fluticasone  (FLONASE ) 50 MCG/ACT nasal spray Place 1 spray into both nostrils daily as needed for allergies.   Past Week   isosorbide  dinitrate (ISORDIL ) 30 MG tablet TAKE 1 TABLET BY MOUTH 2 TIMES DAILY. 180 tablet 3 07/31/2024 Morning   losartan  (COZAAR ) 25 MG tablet TAKE 1 TABLET BY MOUTH EVERY DAY IN THE EVENING 90 tablet 3 07/30/2024 Evening   nebivolol  (BYSTOLIC ) 5 MG tablet TAKE 1 TABLET (5 MG TOTAL) BY MOUTH DAILY. 90 tablet 2 07/31/2024 Morning   rosuvastatin  (CRESTOR ) 10 MG tablet TAKE 1 TABLET BY MOUTH EVERY DAY 90 tablet 3 07/31/2024 Morning   sodium chloride  (OCEAN) 0.65 % nasal spray Place 1 spray into the nose daily as needed for congestion.   Past Week   tamsulosin  (FLOMAX ) 0.4 MG CAPS capsule Take 1 capsule by mouth every evening.  07/30/2024 Evening   torsemide  (DEMADEX ) 10 MG tablet Take 1 tablet (10 mg total) by mouth daily as needed. 1-2 tab daily except Tue and thur as directed (Patient taking differently: Take 10 mg by mouth daily as needed (for fluid).) 110 tablet 3 07/30/2024 Morning   cyanocobalamin  (VITAMIN B12) 500 MCG tablet Take 500 mcg by mouth daily.   Unknown   nitroGLYCERIN  (NITROSTAT ) 0.4 MG SL tablet Place 0.4 mg under the tongue every 5 (five) minutes as needed for chest pain.   Unknown   Scheduled:   allopurinol   300 mg Oral Daily   aspirin   81 mg Oral Daily   cyanocobalamin   500 mcg Oral Daily   [START ON 08/03/2024] ferrous sulfate   325 mg Oral Q M,W,F   isosorbide  dinitrate  30 mg Oral BID   losartan   25 mg Oral q1800   nebivolol   5 mg Oral Daily   rosuvastatin   10 mg Oral Daily   sodium chloride  flush  3 mL Intravenous Q12H   tamsulosin   0.4 mg Oral QPM   torsemide   10 mg Oral Daily    Infusions:   heparin  1,650 Units/hr (08/01/24 0816)   PRN: acetaminophen  **OR** acetaminophen , fluticasone , methocarbamol , ondansetron  **OR** ondansetron  (ZOFRAN ) IV, oxyCODONE , senna-docusate  Assessment: 83 yom with a history of HF. Patient is presenting with leg swelling and dyspnea. Heparin  per pharmacy consult placed for pulmonary embolus.  CTA w/ bilateral PE w/out CT evidence of RHS  Patient is not on anticoagulation prior to arrival.  Heparin  level >1.1 is supratherapeutic with heparin  running at 1650 units/hr. Repeat heparin  level remained > 1.1 when drawn from the arm opposite of the heparin  infusion. Hgb (10.5) and PLTs (164) are stable. Per RN, no report of pauses, issues with the line, or signs of bleeding.    Goal of Therapy:  Heparin  level 0.3-0.7 units/ml Monitor platelets by anticoagulation protocol: Yes   Plan:  Hold heparin  infusion x1 hour Restart heparin  infusion at 1350 units/hr (decrease by ~ 3 units/kg/hr) Check anti-Xa level in 8 hours and daily while on heparin  Continue to monitor H&H and platelets  Thank you for allowing pharmacy to be a part of this patient's care.   Nidia Schaffer, PharmD PGY2 Cardiology Pharmacy Resident  Please check AMION for all [facility] Pharmacy phone numbers After 10:00 PM, call Main Pharmacy [phone] 08/01/2024 10:55 AM

## 2024-08-01 NOTE — Hospital Course (Addendum)
 83 y.o. male with medical history significant for hypertension, CKD 3B, cervical spinal stenosis, CAD, chronic HFpEF, and OSA on CPAP who presents with increasing shortness of breath recently along with, increased  leg edema, and increased neck pain over the course of weeks to months.  In ED: afebrile and saturating well on room air with normal HR and stable BP.  Labs are most notable for creatinine 1.64, normal WBC, normal troponin, and normal BNP.  ED workup reveals bilateral segmental and subsegmental PE without evidence for heart strain on CT. Patient was admitted on heparin  drip Once echo duplex resulted plan is to transition to oral anticoagulation and discharging on home Eliquis  which has been arranged .  Has been mobilizing well not needing oxygen  Subjective: Seen and examined today Overnight vitals stable although BP on softer side 120s, saturating well on room air Complains of leg being swollen but not new He has been short of breath with mobility, has not been mobile much  Discharge diagnosis: Acute b/l segmental and subsegmental pulmonary embolism: He has been short of breath with mobility, has not been mobile much- Unclear etiology of VTE he will need outpatient hematology follow-up this has been discussed with the patient.  Managed with heparin  drip> with plan to transition to Eliquis  today once echo and duplex finalized-and if no acute significant finding.+tte was ok. Duplex w/ clot on popliteal vein. Continue supportive care ambulation No heart strain on CT and Serial troponins negative  OSA: cont CPAP  CKD stage 3b at baseline.  Monitor Recent Labs    03/10/24 1201 07/31/24 1104 08/01/24 0755 08/02/24 0209  BUN 29* 22 24* 27*  CREATININE 1.59* 1.64* 1.53* 1.50*  CO2 23 25 23 25   K 4.1 3.8 3.8 3.6    Hypertension CAD/HLD Chronic heart failure with preserved ejection fraction Bilateral leg edema: BP soft/stable-hold off on amlodipine  otherwise continue Bystolic ,  Imdur, losartan . He was edematous leg encouraged elevation. Resume home torsemide  (he takes prn). Continue statin, PTA on aspirin -reports he had stent placed so will defer to his cardiology as outpatient if he needs to continue  While on DOAC-discussed with patient.  Cervical spinal stenosis: S/p ACDF in 2022 and w/ Increased neck pain recently, no weakness or fevers/chills.  Advise for outpatient follow-up with his neurosurgeon  BPH: Continue tamsulosin   Mobility: at baseline PT Orders:  PT Follow up Rec: Outpatient Pt (Lymphedema And Orthopedic Pt For Lymphedema Management And Strengthening.)08/02/2024 1017   DVT prophylaxis: heaprin Code Status:   Code Status: Full Code Family Communication: plan of care discussed with patient/wife at bedside. Patient status is: Remains hospitalized because of severity of illness Level of care: Telemetry Cardiac   Dispo: The patient is from: home            Anticipated disposition:  home later today  Objective: Vitals last 24 hrs: Vitals:   08/01/24 2306 08/02/24 0422 08/02/24 0742 08/02/24 1214  BP: 136/70 128/67 124/66 (!) 142/78  Pulse: 79 66 77 89  Resp: 11 10 (!) 23 14  Temp: 98.6 F (37 C) 98.7 F (37.1 C) 99.2 F (37.3 C) 98.9 F (37.2 C)  TempSrc: Oral Oral Oral Oral  SpO2:  97% 97% 95%  Weight:      Height:        Physical Examination: General exam: aAAOX3 HEENT:Oral mucosa moist, Ear/Nose WNL grossly Respiratory system: Bilaterally clear BS,no use of accessory muscle Cardiovascular system: S1 & S2 +, No JVD. Gastrointestinal system: Abdomen soft,NT,ND, BS+ Nervous System:  Alert, awake, moving all extremities,and following commands. Extremities: LE edema mild,Mildly tender on left neck, no tenderness on midline on the neck Skin: No rashes,no icterus. MSK: Normal muscle bulk,tone, power   Medications reviewed:  Scheduled Meds:  allopurinol   300 mg Oral Daily   apixaban   10 mg Oral BID   Followed by   NOREEN ON  08/09/2024] apixaban   5 mg Oral BID   aspirin   81 mg Oral Daily   cyanocobalamin   500 mcg Oral Daily   [START ON 08/03/2024] ferrous sulfate   325 mg Oral Q M,W,F   isosorbide  dinitrate  30 mg Oral BID   losartan   25 mg Oral q1800   nebivolol   5 mg Oral Daily   rosuvastatin   10 mg Oral Daily   sodium chloride  flush  3 mL Intravenous Q12H   tamsulosin   0.4 mg Oral QPM   torsemide   10 mg Oral Daily   Continuous Infusions:   Diet: Diet Order             Diet regular Fluid consistency: Thin  Diet effective now

## 2024-08-01 NOTE — Progress Notes (Signed)
 PROGRESS NOTE Martin Stephenson  FMW:[id number] DOB: [birth year] DOA: 07/31/2024 PCP: Vernadine Charlie ORN, MD  Brief Narrative/Hospital Course: 83 y.o. male with medical history significant for hypertension, CKD 3B, cervical spinal stenosis, CAD, chronic HFpEF, and OSA on CPAP who presents with increasing shortness of breath recently along with, increased  leg edema, and increased neck pain over the course of weeks to months.  In ED: afebrile and saturating well on room air with normal HR and stable BP.  Labs are most notable for creatinine 1.64, normal WBC, normal troponin, and normal BNP.  ED workup reveals bilateral segmental and subsegmental PE without evidence for heart strain on CT. Patient was admitted on heparin  drip  Subjective: Seen and examined today Overnight vitals stable although BP on softer side 120s, saturating well on room air Complains of leg being swollen but not new He has been short of breath with mobility, has not been mobile much  Assessment and plan:  Acute bilateral segmental and subsegmental pulmonary embolism: He has been short of breath with mobility, has not been mobile much- Unclear etiology. Vitals stable although BP soft will hold amlodipine .  No heart strain on CT and Serial troponins negative F/u TTE duplex of the legs. Continue current heparin  drip at least 24 hours then convert to Eliquis   OSA: cont CPAP  CKD stage 3b At baseline.  Monitor  Hypertension CAD/HLD Chronic heart failure with preserved ejection fraction Bilateral leg edema: BP soft/stable-hold off on amlodipine  otherwise continue Bystolic , Imdur, losartan .  He was edematous leg encouraged elevation. Resume home torsemide  (he takes prn). Continue statin, PTA on aspirin -and first he had stent placed so will defer to his cardiology as outpatient if he needs to continue  While on DOAC-discussed with patient.  Cervical spinal stenosis: S/p ACDF in 2022 and w/ Increased neck pain recently, no  weakness or fevers/chills.  Advise for outpatient follow-up with his neurosurgeon  BPH: Continue tamsulosin   Mobility: PT Orders:  PT Follow up Rec:    DVT prophylaxis:  Code Status:   Code Status: Full Code Family Communication: plan of care discussed with patient/wife at bedside. Patient status is: Remains hospitalized because of severity of illness Level of care: Telemetry Cardiac   Dispo: The patient is from: home            Anticipated disposition: TBD.  Obtain PT OT evaluation today Objective: Vitals last 24 hrs: Vitals:   07/31/24 2200 07/31/24 2342 08/01/24 0306 08/01/24 0743  BP: (!) 154/65 (!) 144/81 133/68 121/66  Pulse: 67 69 67 67  Resp: 12 19 19 16   Temp: 98.1 F (36.7 C) 98.6 F (37 C) 98.1 F (36.7 C) 97.9 F (36.6 C)  TempSrc: Oral Oral Axillary Oral  SpO2: 96% 96% 96% 95%  Weight:      Height:        Physical Examination: General exam: alert awake, oriented, older than stated age HEENT:Oral mucosa moist, Ear/Nose WNL grossly Respiratory system: Bilaterally clear BS,no use of accessory muscle Cardiovascular system: S1 & S2 +, No JVD. Gastrointestinal system: Abdomen soft,NT,ND, BS+ Nervous System: Alert, awake, moving all extremities,and following commands. Extremities: LE edema ++, distal extremities warm.  Mildly tender on left neck, no tenderness on midline on the neck Skin: No rashes,no icterus. MSK: Normal muscle bulk,tone, power   Medications reviewed:  Scheduled Meds:  allopurinol   300 mg Oral Daily   aspirin   81 mg Oral Daily   isosorbide  dinitrate  30 mg Oral BID   losartan   25 mg Oral q1800   nebivolol   5 mg Oral Daily   rosuvastatin   10 mg Oral Daily   sodium chloride  flush  3 mL Intravenous Q12H   tamsulosin   0.4 mg Oral QPM   Continuous Infusions:  heparin  1,650 Units/hr (08/01/24 0816)   Diet: Diet Order             Diet regular Fluid consistency: Thin  Diet effective now                     Data Reviewed: I have  personally reviewed following labs and imaging studies ( see epic result tab) CBC: Recent Labs  Lab 07/31/24 1104 08/01/24 0755  WBC 7.7 6.2  HGB 10.9* 10.5*  HCT 34.0* 32.6*  MCV 102.1* 100.3*  PLT 165 164   CMP: Recent Labs  Lab 07/31/24 1104 08/01/24 0755  NA 142 139  K 3.8 3.8  CL 106 106  CO2 25 23  GLUCOSE 148* 151*  BUN 22 24*  CREATININE 1.64* 1.53*  CALCIUM  9.4 9.0   GFR: Estimated Creatinine Clearance: 46.5 mL/min (A) (by C-G formula based on SCr of 1.53 mg/dL (H)). No results for input(s): AST, ALT, ALKPHOS, BILITOT, PROT, ALBUMIN in the last 168 hours. No results for input(s): LIPASE, AMYLASE in the last 168 hours. No results for input(s): AMMONIA in the last 168 hours. Coagulation Profile: No results for input(s): INR, PROTIME in the last 168 hours. Unresulted Labs (From admission, onward)     Start     Ordered   08/02/24 0500  Heparin  level (unfractionated)  Daily,   R      07/31/24 2014   08/01/24 1000  Heparin  level (unfractionated)  ONCE - URGENT,   URGENT       Comments: Concern for lab error, redraw please   Question:  Specimen collection method  Answer:  Lab=Lab collect   08/01/24 1002   08/01/24 0500  Basic metabolic panel  Daily,   R      07/31/24 2109   08/01/24 0500  CBC  Daily,   R      07/31/24 2109           Antimicrobials/Microbiology: Anti-infectives (From admission, onward)    None         Component Value Date/Time   SDES URINE, RANDOM 08/11/2016 0107   SPECREQUEST NONE 08/11/2016 0107   CULT NO GROWTH 08/11/2016 0107   REPTSTATUS 08/12/2016 FINAL 08/11/2016 0107    Procedures:   Mennie LAMY, MD [facility] 08/01/2024, 10:31 AM

## 2024-08-01 NOTE — Progress Notes (Signed)
 ANTICOAGULATION CONSULT NOTE  Pharmacy Consult for Heparin  Indication: pulmonary embolus  Allergies  Allergen Reactions   Labetalol  Itching   Aleve [Naproxen Sodium]     Patient has been instructed to avoid Ibuprofen  b/c of his Creatinine level.   Ibuprofen  Other (See Comments)    Patient has been instructed to avoid Ibuprofen  b/c of his Creatinine level.   Furacin [Nitrofurazone] Rash    Patient Measurements: Height: 6' 4 (193 cm) Weight: 103.1 kg (227 lb 4.7 oz) IBW/kg (Calculated) : 86.8 Heparin  Dosing Weight: 102.1 kg  Vital Signs: Temp: 98.2 F (36.8 C) (08/09 2000) Temp Source: Oral (08/09 2000) BP: 137/62 (08/09 2000) Pulse Rate: 63 (08/09 2000)  Labs: Recent Labs    07/31/24 1104 07/31/24 1600 08/01/24 0755 08/01/24 1013 08/01/24 2049  HGB 10.9*  --  10.5*  --   --   HCT 34.0*  --  32.6*  --   --   PLT 165  --  164  --   --   HEPARINUNFRC  --   --  >1.10* >1.10* 0.87*  CREATININE 1.64*  --  1.53*  --   --   TROPONINIHS  --  8  --   --   --     Estimated Creatinine Clearance: 46.5 mL/min (A) (by C-G formula based on SCr of 1.53 mg/dL (H)).   Medical History: Past Medical History:  Diagnosis Date   Allergy     Anemia    Arthritis    foot   CKD stage 3b, GFR 30-44 ml/min (HCC) 07/31/2024   Congestive heart failure (HCC)    Constipation    uses mag citrate OTC if needed   GERD (gastroesophageal reflux disease)    Hiatal hernia    Hyperlipidemia    Hypertension    Kidney stone    Sleep apnea    wears cpap   Squamous cell cancer of external ear, right    Tubular adenoma of colon 05/2017    Medications:  Medications Prior to Admission  Medication Sig Dispense Refill Last Dose/Taking   Albuterol Sulfate (PROAIR RESPICLICK) 108 (90 Base) MCG/ACT AEPB 2 puffs into the lungs 4x a day PRN   Past Month   allopurinol  (ZYLOPRIM ) 300 MG tablet Take 300 mg by mouth daily.    07/31/2024 Morning   amLODipine  (NORVASC ) 5 MG tablet TAKE 1 TABLET BY MOUTH  EVERY DAY 90 tablet 3 07/31/2024 Morning   aspirin  81 MG tablet Take 81 mg by mouth daily.   07/31/2024 Morning   Calcium  Carb-Cholecalciferol (CALCIUM  600/VITAMIN D3 PO) Take 1 tablet by mouth daily.    07/31/2024 Morning   ferrous sulfate  325 (65 FE) MG tablet Take 325 mg by mouth every Monday, Wednesday, and Friday.   07/29/2024   fluticasone  (FLONASE ) 50 MCG/ACT nasal spray Place 1 spray into both nostrils daily as needed for allergies.   Past Week   isosorbide  dinitrate (ISORDIL ) 30 MG tablet TAKE 1 TABLET BY MOUTH 2 TIMES DAILY. 180 tablet 3 07/31/2024 Morning   losartan  (COZAAR ) 25 MG tablet TAKE 1 TABLET BY MOUTH EVERY DAY IN THE EVENING 90 tablet 3 07/30/2024 Evening   nebivolol  (BYSTOLIC ) 5 MG tablet TAKE 1 TABLET (5 MG TOTAL) BY MOUTH DAILY. 90 tablet 2 07/31/2024 Morning   rosuvastatin  (CRESTOR ) 10 MG tablet TAKE 1 TABLET BY MOUTH EVERY DAY 90 tablet 3 07/31/2024 Morning   sodium chloride  (OCEAN) 0.65 % nasal spray Place 1 spray into the nose daily as needed for congestion.   Past  Week   tamsulosin  (FLOMAX ) 0.4 MG CAPS capsule Take 1 capsule by mouth every evening.   07/30/2024 Evening   torsemide  (DEMADEX ) 10 MG tablet Take 1 tablet (10 mg total) by mouth daily as needed. 1-2 tab daily except Tue and thur as directed (Patient taking differently: Take 10 mg by mouth daily as needed (for fluid).) 110 tablet 3 07/30/2024 Morning   cyanocobalamin  (VITAMIN B12) 500 MCG tablet Take 500 mcg by mouth daily.   Unknown   nitroGLYCERIN  (NITROSTAT ) 0.4 MG SL tablet Place 0.4 mg under the tongue every 5 (five) minutes as needed for chest pain.   Unknown   Scheduled:   allopurinol   300 mg Oral Daily   aspirin   81 mg Oral Daily   cyanocobalamin   500 mcg Oral Daily   [START ON 08/03/2024] ferrous sulfate   325 mg Oral Q M,W,F   isosorbide  dinitrate  30 mg Oral BID   losartan   25 mg Oral q1800   nebivolol   5 mg Oral Daily   rosuvastatin   10 mg Oral Daily   sodium chloride  flush  3 mL Intravenous Q12H   tamsulosin    0.4 mg Oral QPM   torsemide   10 mg Oral Daily   Infusions:   heparin  1,350 Units/hr (08/01/24 2000)   PRN: acetaminophen  **OR** acetaminophen , fluticasone , methocarbamol , ondansetron  **OR** ondansetron  (ZOFRAN ) IV, oxyCODONE , senna-docusate  Assessment: 83 yom with a history of HF. Patient is presenting with leg swelling and dyspnea. Heparin  per pharmacy consult placed for pulmonary embolus.  CTA w/ bilateral PE w/out CT evidence of RHS  Patient is not on anticoagulation prior to arrival.  Heparin  level 0.87 at upper end of goal on heparin  drip rate 1350 uts/hr . Hgb (10.5) and PLTs (164) are stable.   Goal of Therapy:  Heparin  level 0.3-0.7 units/ml Monitor platelets by anticoagulation protocol: Yes   Plan:  Decrease heparin  infusion 1250 units/hr  Check anti-Xa and CBC daily while on heparin  Continue to monitor s/s bleeding    Olam Chalk Pharm.D. CPP, BCPS Clinical Pharmacist [phone] 08/01/2024 9:34 PM    Please check AMION for all [facility] Pharmacy phone numbers After 10:00 PM, call Main Pharmacy [phone] 08/01/2024 9:30 PM

## 2024-08-02 ENCOUNTER — Observation Stay (HOSPITAL_BASED_OUTPATIENT_CLINIC_OR_DEPARTMENT_OTHER)

## 2024-08-02 ENCOUNTER — Observation Stay (HOSPITAL_COMMUNITY)

## 2024-08-02 DIAGNOSIS — I2699 Other pulmonary embolism without acute cor pulmonale: Secondary | ICD-10-CM | POA: Diagnosis not present

## 2024-08-02 DIAGNOSIS — I2693 Single subsegmental pulmonary embolism without acute cor pulmonale: Secondary | ICD-10-CM | POA: Diagnosis not present

## 2024-08-02 LAB — BASIC METABOLIC PANEL WITH GFR
Anion gap: 9 (ref 5–15)
BUN: 27 mg/dL — ABNORMAL HIGH (ref 8–23)
CO2: 25 mmol/L (ref 22–32)
Calcium: 8.7 mg/dL — ABNORMAL LOW (ref 8.9–10.3)
Chloride: 105 mmol/L (ref 98–111)
Creatinine, Ser: 1.5 mg/dL — ABNORMAL HIGH (ref 0.61–1.24)
GFR, Estimated: 46 mL/min — ABNORMAL LOW (ref >60)
Glucose, Bld: 108 mg/dL — ABNORMAL HIGH (ref 70–99)
Potassium: 3.6 mmol/L (ref 3.5–5.1)
Sodium: 139 mmol/L (ref 135–145)

## 2024-08-02 LAB — ECHOCARDIOGRAM COMPLETE
AR max vel: 3.51 cm2
AV Area VTI: 3.73 cm2
AV Area mean vel: 4.16 cm2
AV Mean grad: 3 mmHg
AV Peak grad: 6.3 mmHg
Ao pk vel: 1.25 m/s
Area-P 1/2: 3.12 cm2
Calc EF: 56.2 %
Height: 76 in
S' Lateral: 2.9 cm
Single Plane A2C EF: 66.6 %
Single Plane A4C EF: 42.1 %
Weight: 3636.71 [oz_av]

## 2024-08-02 LAB — CBC
HCT: 29.3 % — ABNORMAL LOW (ref 39.0–52.0)
Hemoglobin: 9.7 g/dL — ABNORMAL LOW (ref 13.0–17.0)
MCH: 32.9 pg (ref 26.0–34.0)
MCHC: 33.1 g/dL (ref 30.0–36.0)
MCV: 99.3 fL (ref 80.0–100.0)
Platelets: 162 K/uL (ref 150–400)
RBC: 2.95 MIL/uL — ABNORMAL LOW (ref 4.22–5.81)
RDW: 14.5 % (ref 11.5–15.5)
WBC: 6.5 K/uL (ref 4.0–10.5)
nRBC: 0 % (ref 0.0–0.2)

## 2024-08-02 LAB — HEPARIN LEVEL (UNFRACTIONATED): Heparin Unfractionated: 0.64 [IU]/mL (ref 0.30–0.70)

## 2024-08-02 MED ORDER — METHOCARBAMOL 500 MG PO TABS: 500.0000 mg | ORAL_TABLET | Freq: Four times a day (QID) | ORAL | 0 refills | Status: AC | PRN

## 2024-08-02 MED ORDER — POTASSIUM CHLORIDE CRYS ER 20 MEQ PO TBCR
40.0000 meq | EXTENDED_RELEASE_TABLET | Freq: Once | ORAL | Status: AC
  Administered 2024-08-02: 40 meq via ORAL
  Filled 2024-08-02: qty 2

## 2024-08-02 MED ORDER — APIXABAN 5 MG PO TABS
10.0000 mg | ORAL_TABLET | Freq: Two times a day (BID) | ORAL | Status: DC
  Administered 2024-08-02: 10 mg via ORAL
  Filled 2024-08-02: qty 2

## 2024-08-02 MED ORDER — APIXABAN 5 MG PO TABS: 5.0000 mg | ORAL_TABLET | Freq: Two times a day (BID) | ORAL | Status: DC

## 2024-08-02 NOTE — Care Management (Cosign Needed)
    Durable Medical Equipment  (From admission, onward)           Start     Ordered   08/02/24 1440  For home use only DME Walker rolling  Once       Question Answer Comment  Walker: With 5 Inch Wheels   Patient needs a walker to treat with the following condition Weakness      08/02/24 1439           Per PT recommendations

## 2024-08-02 NOTE — Discharge Summary (Signed)
 Physician Discharge Summary  Martin Stephenson FMW:[id number] DOB: 1940/12/27 DOA: 07/31/2024  PCP: Tisovec, Richard W, MD  Admit date: 07/31/2024 Discharge date: 08/02/2024 Recommendations for Outpatient Follow-up:  Follow up with PCP in 1 weeks-call for appointment Please obtain BMP/CBC in one week  Discharge Dispo: home Discharge Condition: Stable Code Status:   Code Status: Full Code Diet recommendation:  Diet Order             Diet regular Fluid consistency: Thin  Diet effective now                    Brief/Interim Summary: 83 y.o. male with medical history significant for hypertension, CKD 3B, cervical spinal stenosis, CAD, chronic HFpEF, and OSA on CPAP who presents with increasing shortness of breath recently along with, increased  leg edema, and increased neck pain over the course of weeks to months.  In ED: afebrile and saturating well on room air with normal HR and stable BP.  Labs are most notable for creatinine 1.64, normal WBC, normal troponin, and normal BNP.  ED workup reveals bilateral segmental and subsegmental PE without evidence for heart strain on CT. Patient was admitted on heparin  drip Once echo duplex resulted plan is to transition to oral anticoagulation and discharging on home Eliquis  which has been arranged .  Has been mobilizing well not needing oxygen  Subjective: Seen and examined today Overnight vitals stable although BP on softer side 120s, saturating well on room air Complains of leg being swollen but not new He has been short of breath with mobility, has not been mobile much  Discharge diagnosis: Acute b/l segmental and subsegmental pulmonary embolism: He has been short of breath with mobility, has not been mobile much- Unclear etiology of VTE he will need outpatient hematology follow-up this has been discussed with the patient.  Managed with heparin  drip> with plan to transition to Eliquis  today once echo and duplex finalized-and if no acute  significant finding.+tte was ok. Duplex w/ clot on popliteal vein. Continue supportive care ambulation No heart strain on CT and Serial troponins negative  OSA: cont CPAP  CKD stage 3b at baseline.  Monitor Recent Labs    03/10/24 1201 07/31/24 1104 08/01/24 0755 08/02/24 0209  BUN 29* 22 24* 27*  CREATININE 1.59* 1.64* 1.53* 1.50*  CO2 23 25 23 25   K 4.1 3.8 3.8 3.6    Hypertension CAD/HLD Chronic heart failure with preserved ejection fraction Bilateral leg edema: BP soft/stable-hold off on amlodipine  otherwise continue Bystolic , Imdur, losartan . He was edematous leg encouraged elevation. Resume home torsemide  (he takes prn). Continue statin, PTA on aspirin -reports he had stent placed so will defer to his cardiology as outpatient if he needs to continue  While on DOAC-discussed with patient.  Cervical spinal stenosis: S/p ACDF in 2022 and w/ Increased neck pain recently, no weakness or fevers/chills.  Advise for outpatient follow-up with his neurosurgeon  BPH: Continue tamsulosin   Mobility: at baseline PT Orders:  PT Follow up Rec: Outpatient Pt (Lymphedema And Orthopedic Pt For Lymphedema Management And Strengthening.)08/02/2024 1017   DVT prophylaxis: heaprin Code Status:   Code Status: Full Code Family Communication: plan of care discussed with patient/wife at bedside. Patient status is: Remains hospitalized because of severity of illness Level of care: Telemetry Cardiac   Dispo: The patient is from: home            Anticipated disposition:  home later today  Objective: Vitals last 24 hrs: Vitals:   08/01/24  2306 08/02/24 0422 08/02/24 0742 08/02/24 1214  BP: 136/70 128/67 124/66 (!) 142/78  Pulse: 79 66 77 89  Resp: 11 10 (!) 23 14  Temp: 98.6 F (37 C) 98.7 F (37.1 C) 99.2 F (37.3 C) 98.9 F (37.2 C)  TempSrc: Oral Oral Oral Oral  SpO2:  97% 97% 95%  Weight:      Height:        Physical Examination: General exam: aAAOX3 HEENT:Oral mucosa  moist, Ear/Nose WNL grossly Respiratory system: Bilaterally clear BS,no use of accessory muscle Cardiovascular system: S1 & S2 +, No JVD. Gastrointestinal system: Abdomen soft,NT,ND, BS+ Nervous System: Alert, awake, moving all extremities,and following commands. Extremities: LE edema mild,Mildly tender on left neck, no tenderness on midline on the neck Skin: No rashes,no icterus. MSK: Normal muscle bulk,tone, power   Medications reviewed:  Scheduled Meds:  allopurinol   300 mg Oral Daily   apixaban   10 mg Oral BID   Followed by   NOREEN ON 08/09/2024] apixaban   5 mg Oral BID   aspirin   81 mg Oral Daily   cyanocobalamin   500 mcg Oral Daily   [START ON 08/03/2024] ferrous sulfate   325 mg Oral Q M,W,F   isosorbide  dinitrate  30 mg Oral BID   losartan   25 mg Oral q1800   nebivolol   5 mg Oral Daily   rosuvastatin   10 mg Oral Daily   sodium chloride  flush  3 mL Intravenous Q12H   tamsulosin   0.4 mg Oral QPM   torsemide   10 mg Oral Daily   Continuous Infusions:   Diet: Diet Order             Diet regular Fluid consistency: Thin  Diet effective now                     Consultation: See note.  Discharge Instructions  Discharge Instructions     Ambulatory referral to Hematology / Oncology   Complete by: As directed    Discharge instructions   Complete by: As directed    Please call call MD or return to ER for similar or worsening recurring problem that brought you to hospital or if any fever,nausea/vomiting,abdominal pain, uncontrolled pain, chest pain,  shortness of breath or any other alarming symptoms.  Please follow-up your doctor as instructed in a week time and call the office for appointment.  Please avoid alcohol, smoking, or any other illicit substance and maintain healthy habits including taking your regular medications as prescribed.  You were cared for by a hospitalist during your hospital stay. If you have any questions about your discharge medications or  the care you received while you were in the hospital after you are discharged, you can call the unit and ask to speak with the hospitalist on call if the hospitalist that took care of you is not available.  Once you are discharged, your primary care physician will handle any further medical issues. Please note that NO REFILLS for any discharge medications will be authorized once you are discharged, as it is imperative that you return to your primary care physician (or establish a relationship with a primary care physician if you do not have one) for your aftercare needs so that they can reassess your need for medications and monitor your lab values   Increase activity slowly   Complete by: As directed       Allergies as of 08/02/2024       Reactions   Labetalol  Itching  Aleve [naproxen Sodium]    Patient has been instructed to avoid Ibuprofen  b/c of his Creatinine level.   Ibuprofen  Other (See Comments)   Patient has been instructed to avoid Ibuprofen  b/c of his Creatinine level.   Furacin [nitrofurazone] Rash        Medication List     PAUSE taking these medications    amLODipine  5 MG tablet Wait to take this until your doctor or other care provider tells you to start again. Commonly known as: NORVASC  TAKE 1 TABLET BY MOUTH EVERY DAY       TAKE these medications    Albuterol Sulfate 108 (90 Base) MCG/ACT Aepb Commonly known as: PROAIR RESPICLICK 2 puffs into the lungs 4x a day PRN   allopurinol  300 MG tablet Commonly known as: ZYLOPRIM  Take 300 mg by mouth daily.   aspirin  81 MG tablet Take 81 mg by mouth daily.   CALCIUM  600/VITAMIN D3 PO Take 1 tablet by mouth daily.   cyanocobalamin  500 MCG tablet Commonly known as: VITAMIN B12 Take 500 mcg by mouth daily.   Eliquis  DVT/PE Starter Pack Generic drug: Apixaban  Starter Pack (10mg  and 5mg ) Take as directed on package: start with two-5mg  tablets twice daily for 7 days. On day 8, switch to one-5mg  tablet twice  daily.   ferrous sulfate  325 (65 FE) MG tablet Take 325 mg by mouth every Monday, Wednesday, and Friday.   fluticasone  50 MCG/ACT nasal spray Commonly known as: FLONASE  Place 1 spray into both nostrils daily as needed for allergies.   isosorbide  dinitrate 30 MG tablet Commonly known as: ISORDIL  TAKE 1 TABLET BY MOUTH 2 TIMES DAILY.   losartan  25 MG tablet Commonly known as: COZAAR  TAKE 1 TABLET BY MOUTH EVERY DAY IN THE EVENING   methocarbamol  500 MG tablet Commonly known as: ROBAXIN  Take 1 tablet (500 mg total) by mouth every 6 (six) hours as needed for muscle spasms.   nebivolol  5 MG tablet Commonly known as: BYSTOLIC  TAKE 1 TABLET (5 MG TOTAL) BY MOUTH DAILY.   nitroGLYCERIN  0.4 MG SL tablet Commonly known as: NITROSTAT  Place 0.4 mg under the tongue every 5 (five) minutes as needed for chest pain.   rosuvastatin  10 MG tablet Commonly known as: CRESTOR  TAKE 1 TABLET BY MOUTH EVERY DAY   sodium chloride  0.65 % nasal spray Commonly known as: OCEAN Place 1 spray into the nose daily as needed for congestion.   tamsulosin  0.4 MG Caps capsule Commonly known as: FLOMAX  Take 1 capsule by mouth every evening.   torsemide  10 MG tablet Commonly known as: DEMADEX  Take 1 tablet (10 mg total) by mouth daily as needed. 1-2 tab daily except Tue and thur as directed What changed:  reasons to take this additional instructions               Durable Medical Equipment  (From admission, onward)           Start     Ordered   08/02/24 1440  For home use only DME Walker rolling  Once       Question Answer Comment  Walker: With 5 Inch Wheels   Patient needs a walker to treat with the following condition Weakness      08/02/24 1439            Follow-up Information     Tisovec, Charlie ORN, MD Follow up in 1 week(s).   Specialty: Internal Medicine Contact information: [address] [place] [address] [phone]  [facility] Follow up.   Specialty: Rehabilitation Why: Physical Therapy. Office will call to arrange follow up after hospital discharge. Contact information: [address] [place]  [id number] [phone]               Allergies  Allergen Reactions   Labetalol  Itching   Aleve [Naproxen Sodium]     Patient has been instructed to avoid Ibuprofen  b/c of his Creatinine level.   Ibuprofen  Other (See Comments)    Patient has been instructed to avoid Ibuprofen  b/c of his Creatinine level.   Furacin [Nitrofurazone] Rash    The results of significant diagnostics from this hospitalization (including imaging, microbiology, ancillary and laboratory) are listed below for reference.    Microbiology: Recent Results (from the past 240 hours)  MRSA Next Gen by PCR, Nasal     Status: None   Collection Time: 07/31/24 10:30 PM   Specimen: Nasal Mucosa; Nasal Swab  Result Value Ref Range Status   MRSA by PCR Next Gen NOT DETECTED NOT DETECTED Final    Comment: (NOTE) The GeneXpert MRSA Assay (FDA approved for NASAL specimens only), is one component of a comprehensive MRSA colonization surveillance program. It is not intended to diagnose MRSA infection nor to guide or monitor treatment for MRSA infections. Test performance is not FDA approved in patients less than 45 years old. Performed at [facility], [address] [place], [place] [address]     Procedures/Studies: ECHOCARDIOGRAM COMPLETE Result Date: 08/02/2024    ECHOCARDIOGRAM REPORT   Patient Name:   BRYNDAN BILYK Date of Exam: 08/02/2024 Medical Rec #:  [id number]      Height:       76.0 in Accession #:    [id number]     Weight:       227.3 lb Date of Birth:  04-Apr-1941      BSA:          2.339 m Patient Age:    83 years       BP:           124/66 mmHg Patient Gender: M              HR:           63 bpm. Exam Location:  Inpatient Procedure: 2D Echo, Cardiac Doppler and Color Doppler (Both  Spectral and Color            Flow Doppler were utilized during procedure). Indications:    Pulmonary Embolus  History:        Patient has prior history of Echocardiogram examinations. Risk                 Factors:Hypertension.  Sonographer:    Vella Key Referring Phys: [id number] Idriss Quackenbush IMPRESSIONS  1. Left ventricular ejection fraction, by estimation, is 60 to 65%. The left ventricle has normal function. The left ventricle has no regional wall motion abnormalities. There is mild left ventricular hypertrophy. Left ventricular diastolic parameters are consistent with Grade I diastolic dysfunction (impaired relaxation).  2. Right ventricular systolic function is normal. The right ventricular size is normal.  3. The mitral valve is normal in structure. No evidence of mitral valve regurgitation. No evidence of mitral stenosis.  4. The aortic valve is tricuspid. Aortic valve regurgitation is mild. No aortic stenosis is present. FINDINGS  Left Ventricle: Left ventricular ejection fraction, by estimation, is 60 to 65%. The left ventricle has normal function. The  left ventricle has no regional wall motion abnormalities. The left ventricular internal cavity size was normal in size. There is  mild left ventricular hypertrophy. Left ventricular diastolic parameters are consistent with Grade I diastolic dysfunction (impaired relaxation). Right Ventricle: The right ventricular size is normal. Right vetricular wall thickness was not well visualized. Right ventricular systolic function is normal. Left Atrium: Left atrial size was normal in size. Right Atrium: Right atrial size was normal in size. Pericardium: There is no evidence of pericardial effusion. Mitral Valve: The mitral valve is normal in structure. No evidence of mitral valve regurgitation. No evidence of mitral valve stenosis. Tricuspid Valve: The tricuspid valve is normal in structure. Tricuspid valve regurgitation is not demonstrated. No evidence of tricuspid  stenosis. Aortic Valve: The aortic valve is tricuspid. Aortic valve regurgitation is mild. No aortic stenosis is present. Aortic valve mean gradient measures 3.0 mmHg. Aortic valve peak gradient measures 6.3 mmHg. Aortic valve area, by VTI measures 3.73 cm. Pulmonic Valve: The pulmonic valve was not well visualized. Pulmonic valve regurgitation is not visualized. No evidence of pulmonic stenosis. Aorta: The aortic root and ascending aorta are structurally normal, with no evidence of dilitation. IAS/Shunts: No atrial level shunt detected by color flow Doppler.  LEFT VENTRICLE PLAX 2D LVIDd:         4.60 cm      Diastology LVIDs:         2.90 cm      LV e' medial:    6.96 cm/s LV PW:         1.20 cm      LV E/e' medial:  6.4 LV IVS:        1.20 cm      LV e' lateral:   11.00 cm/s LVOT diam:     2.30 cm      LV E/e' lateral: 4.0 LV SV:         94 LV SV Index:   40 LVOT Area:     4.15 cm  LV Volumes (MOD) LV vol d, MOD A2C: 137.0 ml LV vol d, MOD A4C: 121.0 ml LV vol s, MOD A2C: 45.8 ml LV vol s, MOD A4C: 70.0 ml LV SV MOD A2C:     91.2 ml LV SV MOD A4C:     121.0 ml LV SV MOD BP:      72.5 ml RIGHT VENTRICLE RV Basal diam:  3.70 cm RV S prime:     19.40 cm/s TAPSE (M-mode): 3.8 cm LEFT ATRIUM             Index        RIGHT ATRIUM           Index LA diam:        3.70 cm 1.58 cm/m   RA Area:     17.20 cm LA Vol (A2C):   75.0 ml 32.06 ml/m  RA Volume:   49.40 ml  21.12 ml/m LA Vol (A4C):   30.9 ml 13.21 ml/m LA Biplane Vol: 52.9 ml 22.62 ml/m  AORTIC VALVE AV Area (Vmax):    3.51 cm AV Area (Vmean):   4.16 cm AV Area (VTI):     3.73 cm AV Vmax:           125.42 cm/s AV Vmean:          78.653 cm/s AV VTI:            0.252 m AV Peak Grad:      6.3  mmHg AV Mean Grad:      3.0 mmHg LVOT Vmax:         106.00 cm/s LVOT Vmean:        78.700 cm/s LVOT VTI:          0.226 m LVOT/AV VTI ratio: 0.90  AORTA Ao Root diam: 3.70 cm Ao Asc diam:  3.40 cm MITRAL VALVE MV Area (PHT): 3.12 cm    SHUNTS MV Decel Time: 243 msec     Systemic VTI:  0.23 m MV E velocity: 44.50 cm/s  Systemic Diam: 2.30 cm MV A velocity: 56.60 cm/s MV E/A ratio:  0.79 Dorn Ross MD Electronically signed by Dorn Ross MD Signature Date/Time: 08/02/2024/1:02:19 PM    Final    CT Angio Chest PE W and/or Wo Contrast Result Date: 07/31/2024 CLINICAL DATA:  Shortness of breath and chest pain, initial encounter EXAM: CT ANGIOGRAPHY CHEST WITH CONTRAST TECHNIQUE: Multidetector CT imaging of the chest was performed using the standard protocol during bolus administration of intravenous contrast. Multiplanar CT image reconstructions and MIPs were obtained to evaluate the vascular anatomy. RADIATION DOSE REDUCTION: This exam was performed according to the departmental dose-optimization program which includes automated exposure control, adjustment of the mA and/or kV according to patient size and/or use of iterative reconstruction technique. CONTRAST:  75mL OMNIPAQUE  IOHEXOL  350 MG/ML SOLN COMPARISON:  Chest x-ray from earlier in the same day, CT from 10/31/2022 FINDINGS: Cardiovascular: Thoracic aorta shows atherosclerotic calcifications without aneurysmal dilatation. The pulmonary artery shows a normal branching pattern bilaterally. Multiple filling defects are identified within the pulmonary artery bilaterally consistent with pulmonary embolism. These are primarily in the segmental and sub segmental level. No right heart strain is noted. Mediastinum/Nodes: Thoracic inlet is within normal limits. No hilar or mediastinal adenopathy is noted. The esophagus as visualized is within normal limits. Lungs/Pleura: Lungs are well aerated bilaterally. No focal infiltrate or sizable effusion is seen. No parenchymal nodules are noted. Upper Abdomen: Visualized upper abdomen shows no acute abnormality. Musculoskeletal: No chest wall abnormality. No acute or significant osseous findings. Postsurgical changes are noted in the cervical spine. Review of the MIP images confirms the  above findings. IMPRESSION: Bilateral segmental and subsegmental pulmonary emboli primarily within the lower lobes without evidence of right heart strain. No other acute abnormality noted. Aortic Atherosclerosis (ICD10-I70.0). Critical Value/emergent results were called by telephone at the time of interpretation on 07/31/2024 at 7:54 pm to Dr. DONNICE HUGHES , who verbally acknowledged these results. Electronically Signed   By: Oneil Devonshire M.D.   On: 07/31/2024 19:55   MR Cervical Spine W and Wo Contrast Result Date: 07/31/2024 EXAM: MRI CERVICAL SPINE WITHOUT AND WITH CONTRAST 07/31/2024 06:37:56 PM TECHNIQUE: Multiplanar multisequence MRI of the cervical spine was performed without and with the administration of intravenous contrast. COMPARISON: MRI cervical spine dated 03/14/2021. CLINICAL HISTORY: Neck pain with paresthesias of the arm and numbness in feet; history of spinal surgery - evaluation for demyelinating lesion vs cord impingement. FINDINGS: BONES AND ALIGNMENT: Straightening and reversal of the normal cervical lordosis similar to prior. Grade 1 anterolisthesis of C4 on C5. Anterior cervical fusion hardware spanning C4 to C6. Artifact from hardware slightly limits evaluation of the bone marrow. Within this limitation, the bone marrow signal intensity is unremarkable without edema or evidence of fracture. SPINAL CORD: Similar appearance of focal T2 hyperintensity within the cord at C4-5 with volume loss and decreased AP diameter of the cord suggestive of myelomalacia. No abnormal enhancement in the cord. The  visualized posterior fossa is unremarkable. SOFT TISSUES: There is mild edema in the paraspinal soft tissues adjacent to the left facets at C3-4. C2-C3: There is a central disc protrusion which appears slightly increased which indents the ventral thecal sac without contacting the spinal cord. Thickening of the ligamentum flavum. Mild spinal canal stenosis. Facet arthrosis and uncovertebral  hypertrophy with mild bilateral foraminal stenosis slightly increased. C3-C4: There is a disc osteophyte complex and central disc protrusion which indents the ventral thecal sac and contacts the ventral cervical cord with increased flattening of the cord compared to prior. Thickening of the ligamentum flavum. Mild-to-moderate spinal canal stenosis increased from prior. Bilateral facet arthrosis and uncovertebral hypertrophy with moderate bilateral foraminal stenosis. C4-C5: Postsurgical changes at C4-5 with posterior osteophytes indenting the ventral thecal sac with flattening of the ventral cervical cord. The previously noted disc protrusion is not visualized on the current study. There is slightly decreased compression of the cord compared to prior. Thickening of the ligamentum flavum. 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 Bilateral facet arthrosis and uncovertebral hypertrophy. Moderate-to-severe bilateral foraminal stenosis which appears slightly improved. C5-C6: Surgical changes at C5-6. Disc osteophyte complex eccentric to the right which indents the ventral thecal sac with mild flattening of the ventral cord. Thickening of the ligamentum flavum. Mild spinal canal stenosis. Bilateral facet arthrosis and uncovertebral hypertrophy resulting in mild bilateral foraminal stenosis. C6-C7: There is a small disc osteophyte complex eccentric to the right which indents the ventral thecal sac without contacting the spinal cord. Thickening of the ligamentum flavum. Mild spinal canal stenosis. Bilateral facet arthrosis and uncovertebral hypertrophy. Mild right foraminal stenosis. C7-T1: There is a diffuse disc bulge which indents the ventral thecal sac. Thickening of the ligamentum flavum. Mild spinal canal stenosis which is increased from prior. Bilateral facet arthrosis with severe bilateral foraminal stenosis. IMPRESSION: 1. Interval ACDF at C4-C6. 2. Previously noted disc protrusion at C4-5 is not present on the  current study. Posterior osteophytes result in residual flattening of the ventral cord. Cord compression at C4-5 is overall improved from prior. 3. 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 4. Small facet effusion on the left at C3-4 with mild edema in the adjacent soft tissues, likely related to degenerative changes. Superimposed infection cannot be excluded. 5. Multilevel degenerative changes as detailed above. Foraminal stenosis greatest at C4-5 and C7-T1. Spinal canal stenosis at C3-4 and at C7-T1 is slightly increased. Electronically signed by: Donnice Mania MD 07/31/2024 07:12 PM EDT RP Workstation: [id number]   DG Chest 2 View Result Date: 07/31/2024 CLINICAL DATA:  Shortness of breath. EXAM: CHEST - 2 VIEW COMPARISON:  None Available. FINDINGS: The heart size and mediastinal contours are within normal limits. Both lungs are clear. Mild thoracolumbar dextroscoliosis noted. IMPRESSION: No active cardiopulmonary disease. Electronically Signed   By: Norleen DELENA Kil M.D.   On: 07/31/2024 11:34    Labs: BNP (last 3 results) Recent Labs    07/31/24 1104  BNP 20.5   Basic Metabolic Panel: Recent Labs  Lab 07/31/24 1104 08/01/24 0755 08/02/24 0209  NA 142 139 139  K 3.8 3.8 3.6  CL 106 106 105  CO2 25 23 25   GLUCOSE 148* 151* 108*  BUN 22 24* 27*  CREATININE 1.64* 1.53* 1.50*  CALCIUM  9.4 9.0 8.7*   Liver Function Tests: No results for input(s): AST, ALT, ALKPHOS, BILITOT, PROT, ALBUMIN in the last 168 hours. No results for input(s): LIPASE, AMYLASE in the last 168 hours. No results for input(s):  AMMONIA in the last 168 hours. CBC: Recent Labs  Lab 07/31/24 1104 08/01/24 0755 08/02/24 0209  WBC 7.7 6.2 6.5  HGB 10.9* 10.5* 9.7*  HCT 34.0* 32.6* 29.3*  MCV 102.1* 100.3* 99.3  PLT 165 164 162   CBG: No results for input(s): GLUCAP in the last 168 hours. Hgb A1c No results for input(s): HGBA1C in the last 72  hours. Anemia work up No results for input(s): VITAMINB12, FOLATE, FERRITIN, TIBC, IRON, RETICCTPCT in the last 72 hours.  Cardiac Enzymes: No results for input(s): CKTOTAL, CKMB, CKMBINDEX, TROPONINI in the last 168 hours. BNP: Invalid input(s): POCBNP D-Dimer No results for input(s): DDIMER in the last 72 hours. Lipid Profile No results for input(s): CHOL, HDL, LDLCALC, TRIG, CHOLHDL, LDLDIRECT in the last 72 hours. Thyroid  function studies No results for input(s): TSH, T4TOTAL, T3FREE, THYROIDAB in the last 72 hours.  Invalid input(s): FREET3 Urinalysis    Component Value Date/Time   COLORURINE YELLOW 10/30/2022 1406   APPEARANCEUR CLEAR 10/30/2022 1406   LABSPEC 1.018 10/30/2022 1406   PHURINE 5.0 10/30/2022 1406   GLUCOSEU NEGATIVE 10/30/2022 1406   HGBUR SMALL (A) 10/30/2022 1406   BILIRUBINUR NEGATIVE 10/30/2022 1406   KETONESUR NEGATIVE 10/30/2022 1406   PROTEINUR 30 (A) 10/30/2022 1406   NITRITE NEGATIVE 10/30/2022 1406   LEUKOCYTESUR NEGATIVE 10/30/2022 1406   Sepsis Labs Recent Labs  Lab 07/31/24 1104 08/01/24 0755 08/02/24 0209  WBC 7.7 6.2 6.5   Microbiology Recent Results (from the past 240 hours)  MRSA Next Gen by PCR, Nasal     Status: None   Collection Time: 07/31/24 10:30 PM   Specimen: Nasal Mucosa; Nasal Swab  Result Value Ref Range Status   MRSA by PCR Next Gen NOT DETECTED NOT DETECTED Final    Comment: (NOTE) The GeneXpert MRSA Assay (FDA approved for NASAL specimens only), is one component of a comprehensive MRSA colonization surveillance program. It is not intended to diagnose MRSA infection nor to guide or monitor treatment for MRSA infections. Test performance is not FDA approved in patients less than 53 years old. Performed at [facility], [address] [place], [place] [address]      Time coordinating discharge: 35 minutes  SIGNED: Mennie LAMY, MD  [facility] 08/02/2024, 3:25 PM  If 7PM-7AM, please contact night-coverage [email or web address]

## 2024-08-02 NOTE — Evaluation (Signed)
 Physical Therapy Evaluation Patient Details Name: Martin Stephenson MRN: [id number] DOB: April 09, 1941 Today's Date: 08/02/2024  History of Present Illness  Pt is 83 yo presenting to [facility] ED on 8/8 due to increasing shortness of breath, LE edema and neck pain over the course of weeks to months. Found with bilateral segmental and subsegmental PE without evidence for heart strain on CT. PMH: HTN, CKD III, cervical spinal stenosis, CAD, chronic HfpEF, OSA on CPAP.  Clinical Impression  Pt is presenting slightly below baseline level of functioning. Currently pt is Mod I for bed mobility, sit to stand and gait with RW. Pt is Min A for 2 steps but has stair lift at home. Pt has LLE lymphedema which increases pain and stiffness in the LLE. Due to pt current functional status, home set up and available assistance at home recommending skilled physical therapy services 3x/week in order to address strength, balance and functional mobility to decrease risk for falls, injury and re-hospitalization.           If plan is discharge home, recommend the following: Help with stairs or ramp for entrance;Assistance with cooking/housework     Equipment Recommendations Rolling walker (2 wheels)     Functional Status Assessment Patient has had a recent decline in their functional status and demonstrates the ability to make significant improvements in function in a reasonable and predictable amount of time.     Precautions / Restrictions Precautions Precautions: Fall Recall of Precautions/Restrictions: Intact Restrictions Weight Bearing Restrictions Per Provider Order: No      Mobility  Bed Mobility Overal bed mobility: Modified Independent       General bed mobility comments: incr time to get to EOB    Transfers Overall transfer level: Modified independent Equipment used: Rolling walker (2 wheels)       General transfer comment: increased time to get fully to standing.     Ambulation/Gait Ambulation/Gait assistance: Modified independent (Device/Increase time) Gait Distance (Feet): 300 Feet Assistive device: Rolling walker (2 wheels) Gait Pattern/deviations: Decreased stride length, Trunk flexed Gait velocity: decreased Gait velocity interpretation: 1.31 - 2.62 ft/sec, indicative of limited community ambulator   General Gait Details: R hip is dropped compared to L, Cues for upright posture, body habitus distance to RW and increasing speed.  Stairs Stairs: Yes Stairs assistance: Min assist, Contact guard assist Stair Management: One rail Right, Step to pattern, Sideways, Forwards Number of Stairs: 4 General stair comments: Pt with difficulty stepping up initially attempted to step up with LLE but unable, tried LLE able with Min A, Improved to CGA for ascending continued to be MIn A for descending sideways and forwards with education for sequencing. Pt has a way he has been sequencing which is opposite of education but works best for patient.     Balance Overall balance assessment: Mild deficits observed, not formally tested         Pertinent Vitals/Pain Pain Assessment Pain Assessment: Faces Faces Pain Scale: Hurts even more Pain Location: bil knees Pain Descriptors / Indicators: Aching Pain Intervention(s): Limited activity within patient's tolerance, Monitored during session    Home Living Family/patient expects to be discharged to:: Private residence Living Arrangements: Spouse/significant other Available Help at Discharge: Family;Available 24 hours/day Type of Home: House Home Access: Stairs to enter;Level entry Entrance Stairs-Rails: Lawyer of Steps: 7, can go in level entry in garage Alternate Level Stairs-Number of Steps: 12 Home Layout: Two level Home Equipment: Other (comment);Cane - single point Additional Comments: has stair lift  Prior Function Prior Level of Function : Independent/Modified  Independent;Working/employed;Driving             Mobility Comments: works as a Education officer, environmental. Normally ind without an AD. Has had knee pain which does limit him some ADLs Comments: Ind with ADL's and IADLs.     Extremity/Trunk Assessment   Upper Extremity Assessment Upper Extremity Assessment: Defer to OT evaluation;Overall [facility] for tasks assessed    Lower Extremity Assessment Lower Extremity Assessment: Generalized weakness    Cervical / Trunk Assessment Cervical / Trunk Assessment: Kyphotic  Communication   Communication Communication: No apparent difficulties    Cognition Arousal: Alert Behavior During Therapy: WFL for tasks assessed/performed   PT - Cognitive impairments: No apparent impairments     Following commands: Intact       Cueing Cueing Techniques: Verbal cues     General Comments General comments (skin integrity, edema, etc.): LLE lymphedema noted; pt and daughter educated on lymphedema        Assessment/Plan    PT Assessment Patient needs continued PT services  PT Problem List Decreased strength;Decreased activity tolerance;Decreased balance;Decreased mobility;Pain       PT Treatment Interventions DME instruction;Balance training;Gait training;Stair training;Functional mobility training;Therapeutic activities;Therapeutic exercise;Patient/family education    PT Goals (Current goals can be found in the Care Plan section)  Acute Rehab PT Goals Patient Stated Goal: To improve mobility and not use walker long term. PT Goal Formulation: With patient Time For Goal Achievement: 08/16/24 Potential to Achieve Goals: Good    Frequency Min 2X/week        AM-PAC PT 6 Clicks Mobility  Outcome Measure Help needed turning from your back to your side while in a flat bed without using bedrails?: None Help needed moving from lying on your back to sitting on the side of a flat bed without using bedrails?: None Help needed moving to and from a bed to a  chair (including a wheelchair)?: None Help needed standing up from a chair using your arms (e.g., wheelchair or bedside chair)?: None Help needed to walk in hospital room?: None Help needed climbing 3-5 steps with a railing? : A Little 6 Click Score: 23    End of Session Equipment Utilized During Treatment: Gait belt Activity Tolerance: Patient tolerated treatment well Patient left: in bed;with call bell/phone within reach;with family/visitor present Nurse Communication: Mobility status PT Visit Diagnosis: Unsteadiness on feet (R26.81);Other abnormalities of gait and mobility (R26.89);Other (comment) (lymphedema)    Time: 9070-8988 PT Time Calculation (min) (ACUTE ONLY): 42 min   Charges:   PT Evaluation $PT Eval Low Complexity: 1 Low PT Treatments $Therapeutic Activity: 23-37 mins PT General Charges $$ ACUTE PT VISIT: 1 Visit        Dorothyann Maier, DPT, CLT  Acute Rehabilitation Services Office: [phone] (Secure chat preferred)   Dorothyann VEAR Maier 08/02/2024, 10:19 AM

## 2024-08-02 NOTE — Progress Notes (Signed)
 OT Cancellation Note  Patient Details Name: Martin Stephenson MRN: [id number] DOB: 1941-04-15   Cancelled Treatment:    Reason Eval/Treat Not Completed: Patient at procedure or test/ unavailable-echo in room. Will follow and see as able.   Etta NOVAK, OT Acute Rehabilitation Services Office [phone] Secure Chat Preferred    Etta GORMAN Hope 08/02/2024, 11:04 AM

## 2024-08-02 NOTE — Progress Notes (Signed)
 VASCULAR LAB    Bilateral lower extremity venous duplex has been performed.  See CV proc for preliminary results.  Gave verbal report to Dr. Christobal LIS, [facility], RVT 08/02/2024, 3:16 PM

## 2024-08-02 NOTE — Plan of Care (Signed)
  Problem: Health Behavior/Discharge Planning: Goal: Ability to manage health-related needs will improve Outcome: Progressing   Problem: Clinical Measurements: Goal: Ability to maintain clinical measurements within normal limits will improve Outcome: Progressing   Problem: Clinical Measurements: Goal: Will remain free from infection Outcome: Progressing   Problem: Clinical Measurements: Goal: Diagnostic test results will improve Outcome: Progressing   

## 2024-08-02 NOTE — Progress Notes (Addendum)
 ANTICOAGULATION CONSULT NOTE  Pharmacy Consult for Heparin  >> apixaban  Indication: pulmonary embolus  Allergies  Allergen Reactions   Labetalol  Itching   Aleve [Naproxen Sodium]     Patient has been instructed to avoid Ibuprofen  b/c of his Creatinine level.   Ibuprofen  Other (See Comments)    Patient has been instructed to avoid Ibuprofen  b/c of his Creatinine level.   Furacin [Nitrofurazone] Rash    Patient Measurements: Height: 6' 4 (193 cm) Weight: 103.1 kg (227 lb 4.7 oz) IBW/kg (Calculated) : 86.8 Heparin  Dosing Weight: 102.1 kg  Vital Signs: Temp: 98.7 F (37.1 C) (08/10 0422) Temp Source: Oral (08/10 0422) BP: 128/67 (08/10 0422) Pulse Rate: 66 (08/10 0422)  Labs: Recent Labs    07/31/24 1104 07/31/24 1104 07/31/24 1600 08/01/24 0755 08/01/24 1013 08/01/24 2049 08/02/24 0209  HGB 10.9*  --   --  10.5*  --   --  9.7*  HCT 34.0*  --   --  32.6*  --   --  29.3*  PLT 165  --   --  164  --   --  162  HEPARINUNFRC  --    < >  --  >1.10* >1.10* 0.87* 0.64  CREATININE 1.64*  --   --  1.53*  --   --  1.50*  TROPONINIHS  --   --  8  --   --   --   --    < > = values in this interval not displayed.    Estimated Creatinine Clearance: 47.4 mL/min (A) (by C-G formula based on SCr of 1.5 mg/dL (H)).   Medical History: Past Medical History:  Diagnosis Date   Allergy     Anemia    Arthritis    foot   CKD stage 3b, GFR 30-44 ml/min (HCC) 07/31/2024   Congestive heart failure (HCC)    Constipation    uses mag citrate OTC if needed   GERD (gastroesophageal reflux disease)    Hiatal hernia    Hyperlipidemia    Hypertension    Kidney stone    Sleep apnea    wears cpap   Squamous cell cancer of external ear, right    Tubular adenoma of colon 05/2017    Medications:  Medications Prior to Admission  Medication Sig Dispense Refill Last Dose/Taking   Albuterol Sulfate (PROAIR RESPICLICK) 108 (90 Base) MCG/ACT AEPB 2 puffs into the lungs 4x a day PRN   Past Month    allopurinol  (ZYLOPRIM ) 300 MG tablet Take 300 mg by mouth daily.    07/31/2024 Morning   amLODipine  (NORVASC ) 5 MG tablet TAKE 1 TABLET BY MOUTH EVERY DAY 90 tablet 3 07/31/2024 Morning   aspirin  81 MG tablet Take 81 mg by mouth daily.   07/31/2024 Morning   Calcium  Carb-Cholecalciferol (CALCIUM  600/VITAMIN D3 PO) Take 1 tablet by mouth daily.    07/31/2024 Morning   ferrous sulfate  325 (65 FE) MG tablet Take 325 mg by mouth every Monday, Wednesday, and Friday.   07/29/2024   fluticasone  (FLONASE ) 50 MCG/ACT nasal spray Place 1 spray into both nostrils daily as needed for allergies.   Past Week   isosorbide  dinitrate (ISORDIL ) 30 MG tablet TAKE 1 TABLET BY MOUTH 2 TIMES DAILY. 180 tablet 3 07/31/2024 Morning   losartan  (COZAAR ) 25 MG tablet TAKE 1 TABLET BY MOUTH EVERY DAY IN THE EVENING 90 tablet 3 07/30/2024 Evening   nebivolol  (BYSTOLIC ) 5 MG tablet TAKE 1 TABLET (5 MG TOTAL) BY MOUTH DAILY. 90 tablet 2  07/31/2024 Morning   rosuvastatin  (CRESTOR ) 10 MG tablet TAKE 1 TABLET BY MOUTH EVERY DAY 90 tablet 3 07/31/2024 Morning   sodium chloride  (OCEAN) 0.65 % nasal spray Place 1 spray into the nose daily as needed for congestion.   Past Week   tamsulosin  (FLOMAX ) 0.4 MG CAPS capsule Take 1 capsule by mouth every evening.   07/30/2024 Evening   torsemide  (DEMADEX ) 10 MG tablet Take 1 tablet (10 mg total) by mouth daily as needed. 1-2 tab daily except Tue and thur as directed (Patient taking differently: Take 10 mg by mouth daily as needed (for fluid).) 110 tablet 3 07/30/2024 Morning   cyanocobalamin  (VITAMIN B12) 500 MCG tablet Take 500 mcg by mouth daily.   Unknown   nitroGLYCERIN  (NITROSTAT ) 0.4 MG SL tablet Place 0.4 mg under the tongue every 5 (five) minutes as needed for chest pain.   Unknown   Scheduled:   allopurinol   300 mg Oral Daily   aspirin   81 mg Oral Daily   cyanocobalamin   500 mcg Oral Daily   [START ON 08/03/2024] ferrous sulfate   325 mg Oral Q M,W,F   isosorbide  dinitrate  30 mg Oral BID   losartan    25 mg Oral q1800   nebivolol   5 mg Oral Daily   rosuvastatin   10 mg Oral Daily   sodium chloride  flush  3 mL Intravenous Q12H   tamsulosin   0.4 mg Oral QPM   torsemide   10 mg Oral Daily   Infusions:   heparin  1,250 Units/hr (08/02/24 0656)   PRN: acetaminophen  **OR** acetaminophen , fluticasone , methocarbamol , ondansetron  **OR** ondansetron  (ZOFRAN ) IV, oxyCODONE , senna-docusate  Assessment: 83 yom with a history of HF. Patient is presenting with leg swelling and dyspnea. Heparin  per pharmacy consult placed for pulmonary embolus.  CTA w/ bilateral PE w/out CT evidence of RHS  Patient is not on anticoagulation prior to arrival.  Heparin  level 0.64 is therapeutic with heparin  running at 1250 units/hr. Hgb (9.7) is down slightly from yesterday, and PLTs (162) are stable. Per RN, no report of pauses, issues with the line, or signs of bleeding.   Goal of Therapy:  Heparin  level 0.3-0.7 units/ml Monitor platelets by anticoagulation protocol: Yes   Plan:  Continue heparin  infusion at 1250 units/hr  Check anti-Xa and CBC daily while on heparin  Continue to monitor s/s bleeding  F/u timing of transition to OAC   1300 Update switching to apixaban  before discharge. Ordered dose to be given before going home. Plan for full 7 day load and then 5 mg twice daily thereafter in the setting of acute PE.   Plan: Stop heparin  infusion Start apixaban  10 mg PO twice daily x7 days then 5 mg twice daily thereafter  Thank you for allowing pharmacy to be a part of this patient's care.   Nidia Schaffer, PharmD PGY2 Cardiology Pharmacy Resident  Please check AMION for all [facility] phone numbers After 10:00 PM, call Main Pharmacy [phone] 08/02/2024 7:18 AM

## 2024-08-02 NOTE — TOC Transition Note (Addendum)
 Transition of Care [facility]) - Discharge Note   Patient Details  Name: Martin Stephenson MRN: [id number] Date of Birth: 08/10/1941  [facility] CM/SW Contact:  Robynn Eileen Hoose, RN Phone Number: 08/02/2024, 2:40 PM   Clinical Narrative:   Patient is being discharged today. PT recommendations for RW noted and ordered through Jermaine with [employer]. DME will be delivered to bedside before patient discharges home.  Outpatient PT for lymphedema recommended by PT. Outpatient referral # [id number], placed for [facility], contact information placed on AVS.    Final next level of care: Home/Self Care Barriers to Discharge: No Barriers Identified   Patient Goals and CMS Choice            Discharge Placement                       Discharge Plan and Services Additional resources added to the After Visit Summary for                                       Social Drivers of Health (SDOH) Interventions SDOH Screenings   Food Insecurity: No Food Insecurity (08/01/2024)  Housing: Low Risk  (07/31/2024)  Transportation Needs: No Transportation Needs (08/01/2024)  Utilities: Not At Risk (07/31/2024)  Depression (PHQ2-9): Low Risk  (09/13/2021)  Social Connections: Socially Integrated (08/01/2024)  Tobacco Use: Low Risk  (07/31/2024)     Readmission Risk Interventions     No data to display

## 2024-08-04 ENCOUNTER — Telehealth (HOSPITAL_BASED_OUTPATIENT_CLINIC_OR_DEPARTMENT_OTHER): Payer: Self-pay | Admitting: Pharmacist

## 2024-08-12 ENCOUNTER — Emergency Department (HOSPITAL_COMMUNITY)

## 2024-08-12 ENCOUNTER — Other Ambulatory Visit: Payer: Self-pay

## 2024-08-12 ENCOUNTER — Emergency Department (HOSPITAL_COMMUNITY)
Admission: EM | Admit: 2024-08-12 | Discharge: 2024-08-12 | Disposition: A | Discharge status: Home / Self Care | Attending: Emergency Medicine | Admitting: Emergency Medicine

## 2024-08-12 ENCOUNTER — Encounter (HOSPITAL_COMMUNITY): Payer: Self-pay

## 2024-08-12 DIAGNOSIS — I129 Hypertensive chronic kidney disease with stage 1 through stage 4 chronic kidney disease, or unspecified chronic kidney disease: Secondary | ICD-10-CM | POA: Diagnosis not present

## 2024-08-12 DIAGNOSIS — T45515A Adverse effect of anticoagulants, initial encounter: Secondary | ICD-10-CM | POA: Insufficient documentation

## 2024-08-12 DIAGNOSIS — N1831 Chronic kidney disease, stage 3a: Secondary | ICD-10-CM | POA: Diagnosis not present

## 2024-08-12 DIAGNOSIS — Z7901 Long term (current) use of anticoagulants: Secondary | ICD-10-CM | POA: Diagnosis not present

## 2024-08-12 DIAGNOSIS — R319 Hematuria, unspecified: Secondary | ICD-10-CM | POA: Diagnosis present

## 2024-08-12 DIAGNOSIS — Z79899 Other long term (current) drug therapy: Secondary | ICD-10-CM | POA: Insufficient documentation

## 2024-08-12 DIAGNOSIS — Z7982 Long term (current) use of aspirin: Secondary | ICD-10-CM | POA: Insufficient documentation

## 2024-08-12 DIAGNOSIS — I10 Essential (primary) hypertension: Secondary | ICD-10-CM

## 2024-08-12 DIAGNOSIS — R030 Elevated blood-pressure reading, without diagnosis of hypertension: Secondary | ICD-10-CM

## 2024-08-12 LAB — CBC WITH DIFFERENTIAL/PLATELET
Abs Immature Granulocytes: 0.03 K/uL (ref 0.00–0.07)
Basophils Absolute: 0 K/uL (ref 0.0–0.1)
Basophils Relative: 0 %
Eosinophils Absolute: 0.1 K/uL (ref 0.0–0.5)
Eosinophils Relative: 1 %
HCT: 34.7 % — ABNORMAL LOW (ref 39.0–52.0)
Hemoglobin: 11.2 g/dL — ABNORMAL LOW (ref 13.0–17.0)
Immature Granulocytes: 0 %
Lymphocytes Relative: 34 %
Lymphs Abs: 2.3 K/uL (ref 0.7–4.0)
MCH: 32.9 pg (ref 26.0–34.0)
MCHC: 32.3 g/dL (ref 30.0–36.0)
MCV: 102.1 fL — ABNORMAL HIGH (ref 80.0–100.0)
Monocytes Absolute: 0.5 K/uL (ref 0.1–1.0)
Monocytes Relative: 7 %
Neutro Abs: 4 K/uL (ref 1.7–7.7)
Neutrophils Relative %: 58 %
Platelets: 314 K/uL (ref 150–400)
RBC: 3.4 MIL/uL — ABNORMAL LOW (ref 4.22–5.81)
RDW: 14.3 % (ref 11.5–15.5)
WBC: 6.9 K/uL (ref 4.0–10.5)
nRBC: 0 % (ref 0.0–0.2)

## 2024-08-12 LAB — URINALYSIS, ROUTINE W REFLEX MICROSCOPIC
Bacteria, UA: NONE SEEN
Bilirubin Urine: NEGATIVE
Glucose, UA: NEGATIVE mg/dL
Ketones, ur: NEGATIVE mg/dL
Leukocytes,Ua: NEGATIVE
Nitrite: NEGATIVE
Protein, ur: NEGATIVE mg/dL
Specific Gravity, Urine: 1.005 (ref 1.005–1.030)
pH: 5 (ref 5.0–8.0)

## 2024-08-12 LAB — BASIC METABOLIC PANEL WITH GFR
Anion gap: 11 (ref 5–15)
BUN: 29 mg/dL — ABNORMAL HIGH (ref 8–23)
CO2: 27 mmol/L (ref 22–32)
Calcium: 9.4 mg/dL (ref 8.9–10.3)
Chloride: 106 mmol/L (ref 98–111)
Creatinine, Ser: 1.5 mg/dL — ABNORMAL HIGH (ref 0.61–1.24)
GFR, Estimated: 46 mL/min — ABNORMAL LOW (ref >60)
Glucose, Bld: 100 mg/dL — ABNORMAL HIGH (ref 70–99)
Potassium: 4.3 mmol/L (ref 3.5–5.1)
Sodium: 144 mmol/L (ref 135–145)

## 2024-08-12 MED ORDER — SODIUM CHLORIDE 0.9 % IV SOLN
1.0000 g | Freq: Once | INTRAVENOUS | Status: AC
  Administered 2024-08-12: 1 g via INTRAVENOUS
  Filled 2024-08-12: qty 10

## 2024-08-12 MED ORDER — CEPHALEXIN 500 MG PO CAPS: 500.0000 mg | ORAL_CAPSULE | Freq: Three times a day (TID) | ORAL | 0 refills | Status: DC

## 2024-08-12 MED ORDER — LACTATED RINGERS IV BOLUS
1000.0000 mL | Freq: Once | INTRAVENOUS | Status: AC
  Administered 2024-08-12: 1000 mL via INTRAVENOUS

## 2024-08-12 NOTE — ED Provider Notes (Addendum)
 [place] EMERGENCY DEPARTMENT AT [facility] Provider Note   CSN: [id number] Arrival date & time: 08/12/24  [id number]     Patient presents with: Hematuria   Martin Stephenson is a 83 y.o. male.   Pt with recently diagnosed dvt/pe, started on eliquis , presents with hematuria. No hx same. Remote hx kidney stones. Pt feels is able to empty bladder. No abdominal or flank pain. No dysuria. No penile pain, no scrotal or testicular pain. No other abnormal bruising or bleeding, no epistaxis, no rectal bleeding or melena. No  faintness or lightheadedness. Normal appetite. No nvd. No other recent change in meds. Is on baby asa q day. No fever or chills.   The history is provided by the patient, the spouse and medical records.  Hematuria Pertinent negatives include no chest pain, no abdominal pain, no headaches and no shortness of breath.       Prior to Admission medications   Medication Sig Start Date End Date Taking? Authorizing Provider  Albuterol Sulfate (PROAIR RESPICLICK) 108 (90 Base) MCG/ACT AEPB 2 puffs into the lungs 4x a day PRN    [provider]  allopurinol  (ZYLOPRIM ) 300 MG tablet Take 300 mg by mouth daily.     [provider]  amLODipine  (NORVASC ) 5 MG tablet TAKE 1 TABLET BY MOUTH EVERY DAY 06/24/23   Ladona Heinz, MD  APIXABAN  (ELIQUIS ) VTE STARTER PACK (10MG  AND 5MG ) Take as directed on package: start with two-5mg  tablets twice daily for 7 days. On day 8, switch to one-5mg  tablet twice daily. 08/01/24   Christobal Guadalajara, MD  aspirin  81 MG tablet Take 81 mg by mouth daily.    [provider]  Calcium  Carb-Cholecalciferol (CALCIUM  600/VITAMIN D3 PO) Take 1 tablet by mouth daily.     [provider]  cyanocobalamin  (VITAMIN B12) 500 MCG tablet Take 500 mcg by mouth daily. 04/15/23   [provider]  ferrous sulfate  325 (65 FE) MG tablet Take 325 mg by mouth every Monday, Wednesday, and Friday. 10/28/20   [provider]  fluticasone   (FLONASE ) 50 MCG/ACT nasal spray Place 1 spray into both nostrils daily as needed for allergies. 05/26/21   [provider]  isosorbide  dinitrate (ISORDIL ) 30 MG tablet TAKE 1 TABLET BY MOUTH 2 TIMES DAILY. 12/27/22   Ladona Heinz, MD  losartan  (COZAAR ) 25 MG tablet TAKE 1 TABLET BY MOUTH EVERY DAY IN THE EVENING 09/23/23   Ladona Heinz, MD  methocarbamol  (ROBAXIN ) 500 MG tablet Take 1 tablet (500 mg total) by mouth every 6 (six) hours as needed for muscle spasms. 08/02/24 09/01/24  Christobal Guadalajara, MD  nebivolol  (BYSTOLIC ) 5 MG tablet TAKE 1 TABLET (5 MG TOTAL) BY MOUTH DAILY. 11/25/23   Ladona Heinz, MD  nitroGLYCERIN  (NITROSTAT ) 0.4 MG SL tablet Place 0.4 mg under the tongue every 5 (five) minutes as needed for chest pain. 04/16/22   [provider]  rosuvastatin  (CRESTOR ) 10 MG tablet TAKE 1 TABLET BY MOUTH EVERY DAY 08/20/23   Ladona Heinz, MD  sodium chloride  (OCEAN) 0.65 % nasal spray Place 1 spray into the nose daily as needed for congestion.    [provider]  tamsulosin  (FLOMAX ) 0.4 MG CAPS capsule Take 1 capsule by mouth every evening. 07/26/23   [provider]  torsemide  (DEMADEX ) 10 MG tablet Take 1 tablet (10 mg total) by mouth daily as needed. 1-2 tab daily except Tue and thur as directed Patient taking differently: Take 10 mg by mouth daily as needed (  for fluid). 02/11/24   Ladona Heinz, MD    Allergies: Labetalol , Aleve [naproxen sodium], Ibuprofen , and Furacin [nitrofurazone]    Review of Systems  Constitutional:  Negative for chills and fever.  HENT:  Negative for nosebleeds.   Respiratory:  Negative for cough and shortness of breath.   Cardiovascular:  Negative for chest pain.  Gastrointestinal:  Negative for abdominal pain, blood in stool and vomiting.  Genitourinary:  Positive for hematuria. Negative for dysuria and flank pain.  Musculoskeletal:  Negative for back pain.  Neurological:  Negative for light-headedness and headaches.    Updated Vital Signs BP (!)  160/66   Pulse 69   Temp 98.3 F (36.8 C) (Oral)   Resp 15   Ht 1.93 m (6' 4)   Wt 103 kg   SpO2 100%   BMI 27.63 kg/m   Physical Exam Vitals and nursing note reviewed.  Constitutional:      Appearance: Normal appearance. He is well-developed.  HENT:     Head: Atraumatic.     Nose: Nose normal.     Mouth/Throat:     Mouth: Mucous membranes are moist.     Pharynx: Oropharynx is clear.  Eyes:     General: No scleral icterus.    Conjunctiva/sclera: Conjunctivae normal.  Neck:     Trachea: No tracheal deviation.  Cardiovascular:     Rate and Rhythm: Normal rate and regular rhythm.     Pulses: Normal pulses.     Heart sounds: Normal heart sounds. No murmur heard.    No friction rub. No gallop.  Pulmonary:     Effort: Pulmonary effort is normal. No accessory muscle usage or respiratory distress.     Breath sounds: Normal breath sounds.  Abdominal:     General: Bowel sounds are normal. There is no distension.     Palpations: Abdomen is soft. There is no mass.     Tenderness: There is no abdominal tenderness. There is no guarding.  Genitourinary:    Comments: No cva tenderness. Normal external gu exam. Scant blood at meatus.  Musculoskeletal:        General: No tenderness.     Cervical back: Normal range of motion and neck supple. No rigidity.  Skin:    General: Skin is warm and dry.     Findings: No rash.  Neurological:     Mental Status: He is alert.     Comments: Alert, speech clear.   Psychiatric:        Mood and Affect: Mood normal.     (all labs ordered are listed, but only abnormal results are displayed) Results for orders placed or performed during the hospital encounter of 08/12/24  CBC with Differential   Collection Time: 08/12/24  4:45 PM  Result Value Ref Range   WBC 6.9 4.0 - 10.5 K/uL   RBC 3.40 (L) 4.22 - 5.81 MIL/uL   Hemoglobin 11.2 (L) 13.0 - 17.0 g/dL   HCT 65.2 (L) 60.9 - 47.9 %   MCV 102.1 (H) 80.0 - 100.0 fL   MCH 32.9 26.0 - 34.0 pg    MCHC 32.3 30.0 - 36.0 g/dL   RDW 85.6 88.4 - 84.4 %   Platelets 314 150 - 400 K/uL   nRBC 0.0 0.0 - 0.2 %   Neutrophils Relative % 58 %   Neutro Abs 4.0 1.7 - 7.7 K/uL   Lymphocytes Relative 34 %   Lymphs Abs 2.3 0.7 - 4.0 K/uL   Monocytes Relative 7 %  Monocytes Absolute 0.5 0.1 - 1.0 K/uL   Eosinophils Relative 1 %   Eosinophils Absolute 0.1 0.0 - 0.5 K/uL   Basophils Relative 0 %   Basophils Absolute 0.0 0.0 - 0.1 K/uL   Immature Granulocytes 0 %   Abs Immature Granulocytes 0.03 0.00 - 0.07 K/uL  Basic metabolic panel   Collection Time: 08/12/24  4:45 PM  Result Value Ref Range   Sodium 144 135 - 145 mmol/L   Potassium 4.3 3.5 - 5.1 mmol/L   Chloride 106 98 - 111 mmol/L   CO2 27 22 - 32 mmol/L   Glucose, Bld 100 (H) 70 - 99 mg/dL   BUN 29 (H) 8 - 23 mg/dL   Creatinine, Ser 8.49 (H) 0.61 - 1.24 mg/dL   Calcium  9.4 8.9 - 10.3 mg/dL   GFR, Estimated 46 (L) >60 mL/min   Anion gap 11 5 - 15  Urinalysis, Routine w reflex microscopic -Urine, Clean Catch   Collection Time: 08/12/24  4:45 PM  Result Value Ref Range   Color, Urine COLORLESS (A) YELLOW   APPearance CLEAR CLEAR   Specific Gravity, Urine 1.005 1.005 - 1.030   pH 5.0 5.0 - 8.0   Glucose, UA NEGATIVE NEGATIVE mg/dL   Hgb urine dipstick MODERATE (A) NEGATIVE   Bilirubin Urine NEGATIVE NEGATIVE   Ketones, ur NEGATIVE NEGATIVE mg/dL   Protein, ur NEGATIVE NEGATIVE mg/dL   Nitrite NEGATIVE NEGATIVE   Leukocytes,Ua NEGATIVE NEGATIVE   RBC / HPF 21-50 0 - 5 RBC/hpf   WBC, UA 0-5 0 - 5 WBC/hpf   Bacteria, UA NONE SEEN NONE SEEN   Squamous Epithelial / HPF 0-5 0 - 5 /HPF   Mucus PRESENT    Hyaline Casts, UA PRESENT    CT Renal Stone Study Result Date: 08/12/2024 CLINICAL DATA:  Abdominal/flank pain, stone suspected EXAM: CT ABDOMEN AND PELVIS WITHOUT CONTRAST TECHNIQUE: Multidetector CT imaging of the abdomen and pelvis was performed following the standard protocol without IV contrast. RADIATION DOSE REDUCTION: This  exam was performed according to the departmental dose-optimization program which includes automated exposure control, adjustment of the mA and/or kV according to patient size and/or use of iterative reconstruction technique. COMPARISON:  10/31/2022 FINDINGS: Lower chest: No acute abnormality. Hepatobiliary: No focal hepatic abnormality. Gallbladder unremarkable. Pancreas: No focal abnormality or ductal dilatation. Spleen: No focal abnormality.  Normal size. Adrenals/Urinary Tract: No adrenal abnormality. No focal renal abnormality. No stones or hydronephrosis. Urinary bladder is unremarkable. Stomach/Bowel: Normal appendix. Sigmoid diverticulosis. No active diverticulitis. Stomach and small bowel decompressed, unremarkable. Vascular/Lymphatic: No evidence of aneurysm or adenopathy. Aortic atherosclerosis. Reproductive: No visible focal abnormality. Other: No free fluid or free air. Musculoskeletal: No acute bony abnormality. Diffuse degenerative disc and facet disease. IMPRESSION: No acute findings in the abdomen or pelvis. Sigmoid diverticulosis.  No active diverticulitis. Aortic atherosclerosis. Electronically Signed   By: Franky Crease M.D.   On: 08/12/2024 22:10   VAS US  LOWER EXTREMITY VENOUS (DVT) Result Date: 08/02/2024  Lower Venous DVT Study Patient Name:  JAVEON MACMURRAY  Date of Exam:   08/02/2024 Medical Rec #: [id number]       Accession #:    [id number] Date of Birth: February 04, 1941       Patient Gender: M Patient Age:   86 years Exam Location:  [facility] Procedure:      VAS US  LOWER EXTREMITY VENOUS (DVT) Referring Phys: TIMOTHY OPYD --------------------------------------------------------------------------------  Indications: Pulmonary embolism, and increased lower extremity swelling.  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 Limitations: Pitting edema. Comparison Study: Prior negative bilateral LEV done 02/27/17, prior negative right                   LEV done 03/22/17 Performing Technologist:  Alberta Lis RVS  Examination Guidelines: A complete evaluation includes B-mode imaging, spectral Doppler, color Doppler, and power Doppler as needed of all accessible portions of each vessel. Bilateral testing is considered an integral part of a complete examination. Limited examinations for reoccurring indications may be performed as noted. The reflux portion of the exam is performed with the patient in reverse Trendelenburg.  +---------+---------------+---------+-----------+----------+--------------+ RIGHT    CompressibilityPhasicitySpontaneityPropertiesThrombus Aging +---------+---------------+---------+-----------+----------+--------------+ CFV      Full           Yes      Yes                                 +---------+---------------+---------+-----------+----------+--------------+ SFJ      Full                                                        +---------+---------------+---------+-----------+----------+--------------+ FV Prox  Full           Yes      Yes                                 +---------+---------------+---------+-----------+----------+--------------+ FV Mid   Full                                                        +---------+---------------+---------+-----------+----------+--------------+ FV DistalFull                                                        +---------+---------------+---------+-----------+----------+--------------+ PFV      Full           Yes      Yes                                 +---------+---------------+---------+-----------+----------+--------------+ POP      Full           Yes      Yes                                 +---------+---------------+---------+-----------+----------+--------------+ PTV      Full                                                        +---------+---------------+---------+-----------+----------+--------------+ PERO     None  Acute           +---------+---------------+---------+-----------+----------+--------------+ Gastroc  Full                                                        +---------+---------------+---------+-----------+----------+--------------+   +---------+---------------+---------+-----------+----------+--------------+ LEFT     CompressibilityPhasicitySpontaneityPropertiesThrombus Aging +---------+---------------+---------+-----------+----------+--------------+ CFV      Full           Yes      Yes                                 +---------+---------------+---------+-----------+----------+--------------+ SFJ      Full                                                        +---------+---------------+---------+-----------+----------+--------------+ FV Prox  Full                                                        +---------+---------------+---------+-----------+----------+--------------+ FV Mid   Full                                                        +---------+---------------+---------+-----------+----------+--------------+ FV DistalFull           Yes      Yes                                 +---------+---------------+---------+-----------+----------+--------------+ PFV      Full                                                        +---------+---------------+---------+-----------+----------+--------------+ POP      Full           Yes      Yes                                 +---------+---------------+---------+-----------+----------+--------------+ PTV      Full                                                        +---------+---------------+---------+-----------+----------+--------------+ PERO     Full                                                        +---------+---------------+---------+-----------+----------+--------------+  Gastroc  Full                                                         +---------+---------------+---------+-----------+----------+--------------+    Summary: RIGHT: - Findings consistent with acute deep vein thrombosis involving the right peroneal veins.  - A cystic structure is found in the popliteal fossa. Effusion noted medial knee.  LEFT: - There is no evidence of deep vein thrombosis in the lower extremity.  - No cystic structure found in the popliteal fossa.  *See table(s) above for measurements and observations.    Preliminary    ECHOCARDIOGRAM COMPLETE Result Date: 08/02/2024    ECHOCARDIOGRAM REPORT   Patient Name:   FLOYD WADE Date of Exam: 08/02/2024 Medical Rec #:  [id number]      Height:       76.0 in Accession #:    [id number]     Weight:       227.3 lb Date of Birth:  1941/11/24      BSA:          2.339 m Patient Age:    83 years       BP:           124/66 mmHg Patient Gender: M              HR:           63 bpm. Exam Location:  Inpatient Procedure: 2D Echo, Cardiac Doppler and Color Doppler (Both Spectral and Color            Flow Doppler were utilized during procedure). Indications:    Pulmonary Embolus  History:        Patient has prior history of Echocardiogram examinations. Risk                 Factors:Hypertension.  Sonographer:    Vella Key Referring Phys: [id number] RAMESH KC IMPRESSIONS  1. Left ventricular ejection fraction, by estimation, is 60 to 65%. The left ventricle has normal function. The left ventricle has no regional wall motion abnormalities. There is mild left ventricular hypertrophy. Left ventricular diastolic parameters are consistent with Grade I diastolic dysfunction (impaired relaxation).  2. Right ventricular systolic function is normal. The right ventricular size is normal.  3. The mitral valve is normal in structure. No evidence of mitral valve regurgitation. No evidence of mitral stenosis.  4. The aortic valve is tricuspid. Aortic valve regurgitation is mild. No aortic stenosis is present. FINDINGS  Left Ventricle: Left ventricular  ejection fraction, by estimation, is 60 to 65%. The left ventricle has normal function. The left ventricle has no regional wall motion abnormalities. The left ventricular internal cavity size was normal in size. There is  mild left ventricular hypertrophy. Left ventricular diastolic parameters are consistent with Grade I diastolic dysfunction (impaired relaxation). Right Ventricle: The right ventricular size is normal. Right vetricular wall thickness was not well visualized. Right ventricular systolic function is normal. Left Atrium: Left atrial size was normal in size. Right Atrium: Right atrial size was normal in size. Pericardium: There is no evidence of pericardial effusion. Mitral Valve: The mitral valve is normal in structure. No evidence of mitral valve regurgitation. No evidence of mitral valve stenosis. Tricuspid Valve: The tricuspid valve is normal in structure. Tricuspid valve regurgitation is not demonstrated. No evidence  of tricuspid stenosis. Aortic Valve: The aortic valve is tricuspid. Aortic valve regurgitation is mild. No aortic stenosis is present. Aortic valve mean gradient measures 3.0 mmHg. Aortic valve peak gradient measures 6.3 mmHg. Aortic valve area, by VTI measures 3.73 cm. Pulmonic Valve: The pulmonic valve was not well visualized. Pulmonic valve regurgitation is not visualized. No evidence of pulmonic stenosis. Aorta: The aortic root and ascending aorta are structurally normal, with no evidence of dilitation. IAS/Shunts: No atrial level shunt detected by color flow Doppler.  LEFT VENTRICLE PLAX 2D LVIDd:         4.60 cm      Diastology LVIDs:         2.90 cm      LV e' medial:    6.96 cm/s LV PW:         1.20 cm      LV E/e' medial:  6.4 LV IVS:        1.20 cm      LV e' lateral:   11.00 cm/s LVOT diam:     2.30 cm      LV E/e' lateral: 4.0 LV SV:         94 LV SV Index:   40 LVOT Area:     4.15 cm  LV Volumes (MOD) LV vol d, MOD A2C: 137.0 ml LV vol d, MOD A4C: 121.0 ml LV vol s, MOD  A2C: 45.8 ml LV vol s, MOD A4C: 70.0 ml LV SV MOD A2C:     91.2 ml LV SV MOD A4C:     121.0 ml LV SV MOD BP:      72.5 ml RIGHT VENTRICLE RV Basal diam:  3.70 cm RV S prime:     19.40 cm/s TAPSE (M-mode): 3.8 cm LEFT ATRIUM             Index        RIGHT ATRIUM           Index LA diam:        3.70 cm 1.58 cm/m   RA Area:     17.20 cm LA Vol (A2C):   75.0 ml 32.06 ml/m  RA Volume:   49.40 ml  21.12 ml/m LA Vol (A4C):   30.9 ml 13.21 ml/m LA Biplane Vol: 52.9 ml 22.62 ml/m  AORTIC VALVE AV Area (Vmax):    3.51 cm AV Area (Vmean):   4.16 cm AV Area (VTI):     3.73 cm AV Vmax:           125.42 cm/s AV Vmean:          78.653 cm/s AV VTI:            0.252 m AV Peak Grad:      6.3 mmHg AV Mean Grad:      3.0 mmHg LVOT Vmax:         106.00 cm/s LVOT Vmean:        78.700 cm/s LVOT VTI:          0.226 m LVOT/AV VTI ratio: 0.90  AORTA Ao Root diam: 3.70 cm Ao Asc diam:  3.40 cm MITRAL VALVE MV Area (PHT): 3.12 cm    SHUNTS MV Decel Time: 243 msec    Systemic VTI:  0.23 m MV E velocity: 44.50 cm/s  Systemic Diam: 2.30 cm MV A velocity: 56.60 cm/s MV E/A ratio:  0.79 Dorn Ross MD Electronically signed by Dorn Ross MD Signature Date/Time: 08/02/2024/1:02:19 PM  Final    CT Angio Chest PE W and/or Wo Contrast Result Date: 07/31/2024 CLINICAL DATA:  Shortness of breath and chest pain, initial encounter EXAM: CT ANGIOGRAPHY CHEST WITH CONTRAST TECHNIQUE: Multidetector CT imaging of the chest was performed using the standard protocol during bolus administration of intravenous contrast. Multiplanar CT image reconstructions and MIPs were obtained to evaluate the vascular anatomy. RADIATION DOSE REDUCTION: This exam was performed according to the departmental dose-optimization program which includes automated exposure control, adjustment of the mA and/or kV according to patient size and/or use of iterative reconstruction technique. CONTRAST:  75mL OMNIPAQUE  IOHEXOL  350 MG/ML SOLN COMPARISON:  Chest x-ray from  earlier in the same day, CT from 10/31/2022 FINDINGS: Cardiovascular: Thoracic aorta shows atherosclerotic calcifications without aneurysmal dilatation. The pulmonary artery shows a normal branching pattern bilaterally. Multiple filling defects are identified within the pulmonary artery bilaterally consistent with pulmonary embolism. These are primarily in the segmental and sub segmental level. No right heart strain is noted. Mediastinum/Nodes: Thoracic inlet is within normal limits. No hilar or mediastinal adenopathy is noted. The esophagus as visualized is within normal limits. Lungs/Pleura: Lungs are well aerated bilaterally. No focal infiltrate or sizable effusion is seen. No parenchymal nodules are noted. Upper Abdomen: Visualized upper abdomen shows no acute abnormality. Musculoskeletal: No chest wall abnormality. No acute or significant osseous findings. Postsurgical changes are noted in the cervical spine. Review of the MIP images confirms the above findings. IMPRESSION: Bilateral segmental and subsegmental pulmonary emboli primarily within the lower lobes without evidence of right heart strain. No other acute abnormality noted. Aortic Atherosclerosis (ICD10-I70.0). Critical Value/emergent results were called by telephone at the time of interpretation on 07/31/2024 at 7:54 pm to Dr. DONNICE HUGHES , who verbally acknowledged these results. Electronically Signed   By: Oneil Devonshire M.D.   On: 07/31/2024 19:55   MR Cervical Spine W and Wo Contrast Result Date: 07/31/2024 EXAM: MRI CERVICAL SPINE WITHOUT AND WITH CONTRAST 07/31/2024 06:37:56 PM TECHNIQUE: Multiplanar multisequence MRI of the cervical spine was performed without and with the administration of intravenous contrast. COMPARISON: MRI cervical spine dated 03/14/2021. CLINICAL HISTORY: Neck pain with paresthesias of the arm and numbness in feet; history of spinal surgery - evaluation for demyelinating lesion vs cord impingement. FINDINGS: BONES AND  ALIGNMENT: Straightening and reversal of the normal cervical lordosis similar to prior. Grade 1 anterolisthesis of C4 on C5. Anterior cervical fusion hardware spanning C4 to C6. Artifact from hardware slightly limits evaluation of the bone marrow. Within this limitation, the bone marrow signal intensity is unremarkable without edema or evidence of fracture. SPINAL CORD: Similar appearance of focal T2 hyperintensity within the cord at C4-5 with volume loss and decreased AP diameter of the cord suggestive of myelomalacia. No abnormal enhancement in the cord. The visualized posterior fossa is unremarkable. SOFT TISSUES: There is mild edema in the paraspinal soft tissues adjacent to the left facets at C3-4. C2-C3: There is a central disc protrusion which appears slightly increased which indents the ventral thecal sac without contacting the spinal cord. Thickening of the ligamentum flavum. Mild spinal canal stenosis. Facet arthrosis and uncovertebral hypertrophy with mild bilateral foraminal stenosis slightly increased. C3-C4: There is a disc osteophyte complex and central disc protrusion which indents the ventral thecal sac and contacts the ventral cervical cord with increased flattening of the cord compared to prior. Thickening of the ligamentum flavum. Mild-to-moderate spinal canal stenosis increased from prior. Bilateral facet arthrosis and uncovertebral hypertrophy with moderate bilateral foraminal stenosis. C4-C5: Postsurgical changes  at C4-5 with posterior osteophytes indenting the ventral thecal sac with flattening of the ventral cervical cord. The previously noted disc protrusion is not visualized on the current study. There is slightly decreased compression of the cord compared to prior. Thickening of the ligamentum flavum. 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 Bilateral facet arthrosis and uncovertebral hypertrophy. Moderate-to-severe bilateral foraminal stenosis which appears slightly improved. C5-C6:  Surgical changes at C5-6. Disc osteophyte complex eccentric to the right which indents the ventral thecal sac with mild flattening of the ventral cord. Thickening of the ligamentum flavum. Mild spinal canal stenosis. Bilateral facet arthrosis and uncovertebral hypertrophy resulting in mild bilateral foraminal stenosis. C6-C7: There is a small disc osteophyte complex eccentric to the right which indents the ventral thecal sac without contacting the spinal cord. Thickening of the ligamentum flavum. Mild spinal canal stenosis. Bilateral facet arthrosis and uncovertebral hypertrophy. Mild right foraminal stenosis. C7-T1: There is a diffuse disc bulge which indents the ventral thecal sac. Thickening of the ligamentum flavum. Mild spinal canal stenosis which is increased from prior. Bilateral facet arthrosis with severe bilateral foraminal stenosis. IMPRESSION: 1. Interval ACDF at C4-C6. 2. Previously noted disc protrusion at C4-5 is not present on the current study. Posterior osteophytes result in residual flattening of the ventral cord. Cord compression at C4-5 is overall improved from prior. 3. 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 4. Small facet effusion on the left at C3-4 with mild edema in the adjacent soft tissues, likely related to degenerative changes. Superimposed infection cannot be excluded. 5. Multilevel degenerative changes as detailed above. Foraminal stenosis greatest at C4-5 and C7-T1. Spinal canal stenosis at C3-4 and at C7-T1 is slightly increased. Electronically signed by: Donnice Mania MD 07/31/2024 07:12 PM EDT RP Workstation: [id number]   DG Chest 2 View Result Date: 07/31/2024 CLINICAL DATA:  Shortness of breath. EXAM: CHEST - 2 VIEW COMPARISON:  None Available. FINDINGS: The heart size and mediastinal contours are within normal limits. Both lungs are clear. Mild thoracolumbar dextroscoliosis noted. IMPRESSION: No active cardiopulmonary disease.  Electronically Signed   By: Norleen DELENA Kil M.D.   On: 07/31/2024 11:34     EKG: None  Radiology: CT Renal Stone Study Result Date: 08/12/2024 CLINICAL DATA:  Abdominal/flank pain, stone suspected EXAM: CT ABDOMEN AND PELVIS WITHOUT CONTRAST TECHNIQUE: Multidetector CT imaging of the abdomen and pelvis was performed following the standard protocol without IV contrast. RADIATION DOSE REDUCTION: This exam was performed according to the departmental dose-optimization program which includes automated exposure control, adjustment of the mA and/or kV according to patient size and/or use of iterative reconstruction technique. COMPARISON:  10/31/2022 FINDINGS: Lower chest: No acute abnormality. Hepatobiliary: No focal hepatic abnormality. Gallbladder unremarkable. Pancreas: No focal abnormality or ductal dilatation. Spleen: No focal abnormality.  Normal size. Adrenals/Urinary Tract: No adrenal abnormality. No focal renal abnormality. No stones or hydronephrosis. Urinary bladder is unremarkable. Stomach/Bowel: Normal appendix. Sigmoid diverticulosis. No active diverticulitis. Stomach and small bowel decompressed, unremarkable. Vascular/Lymphatic: No evidence of aneurysm or adenopathy. Aortic atherosclerosis. Reproductive: No visible focal abnormality. Other: No free fluid or free air. Musculoskeletal: No acute bony abnormality. Diffuse degenerative disc and facet disease. IMPRESSION: No acute findings in the abdomen or pelvis. Sigmoid diverticulosis.  No active diverticulitis. Aortic atherosclerosis. Electronically Signed   By: Franky Crease M.D.   On: 08/12/2024 22:10     Procedures   Medications Ordered in the ED  lactated ringers  bolus 1,000 mL (1,000 mLs Intravenous New Bag/Given 08/12/24 2208)  cefTRIAXone  (  ROCEPHIN ) 1 g in sodium chloride  0.9 % 100 mL IVPB (0 g Intravenous Stopped 08/12/24 2208)                                    Medical Decision Making Problems Addressed: Anticoagulant causing  adverse effect in therapeutic use, initial encounter: acute illness or injury that poses a threat to life or bodily functions Elevated blood pressure reading: acute illness or injury Essential hypertension: chronic illness or injury Hematuria, unspecified type: acute illness or injury with systemic symptoms that poses a threat to life or bodily functions Stage 3a chronic kidney disease (HCC): chronic illness or injury that poses a threat to life or bodily functions  Amount and/or Complexity of Data Reviewed Independent Historian: spouse    Details: hx External Data Reviewed: notes. Labs: ordered. Decision-making details documented in ED Course. Radiology: ordered and independent interpretation performed. Decision-making details documented in ED Course.  Risk Prescription drug management. Decision regarding hospitalization.   Iv ns. Continuous pulse ox and cardiac monitoring. Labs ordered/sent. Imaging ordered.   Differential diagnosis includes hematuria, uti, ureteral stone, etc. Dispo decision including potential need for admission considered - will get labs and imaging and reassess.   Reviewed nursing notes and prior charts for additional history. External reports reviewed. Additional history from: spouse.   Cardiac monitor: sinus rhythm, rate 69.  Labs reviewed/interpreted by me - wbc normal. Hct 35. Plt normal. Chem w ckd c/w prior, k normal. Ua w rbcs. Cx sent. Iv abx. LR bolus.   CT reviewed/interpreted by me - no ureteral stone.   Rec push po fluids, hold next dose of eliquis , recheck pcp 1-2 days.   Recheck, able to void, no abd pain or distension.   Hgb c/w baseline.   Pt currently appears stable for ED d/c.   Rec close pcp f/u.  Return precautions provided.       Final diagnoses:  Hematuria, unspecified type  Anticoagulant causing adverse effect in therapeutic use, initial encounter  Stage 3a chronic kidney disease [facility])    ED Discharge Orders     None              Bernard Drivers, MD 08/12/24 2324

## 2024-08-12 NOTE — ED Triage Notes (Signed)
 Pt arrives via POV. Pt was recently started on Eliquis  on the 10th of this month. He reports hematuria since he was discharged. Pt arrives AxOx4. Denies any other symptoms.

## 2024-08-12 NOTE — ED Provider Triage Note (Signed)
 Emergency Medicine Provider Triage Evaluation Note  Martin Stephenson , a 83 y.o. male  was evaluated in triage.  Pt complains of hematuria.  This started this morning.  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  Has had couple episodes here as well.  Denies any dysuria.  Review of Systems  Positive:  Negative: See above   Physical Exam  BP (!) 149/76 (BP Location: Left Arm)   Pulse 79   Temp 97.7 F (36.5 C)   Resp 17   Ht 6' 4 (1.93 m)   Wt 103 kg   SpO2 98%   BMI 27.63 kg/m  Gen:   Awake, no distress   Resp:  Normal effort  MSK:   Moves extremities without difficulty  Other:    Medical Decision Making  Medically screening exam initiated at 4:41 PM.  Appropriate orders placed.  HARREL FERRONE was informed that the remainder of the evaluation will be completed by another provider, this initial triage assessment does not replace that evaluation, and the importance of remaining in the ED until their evaluation is complete.     Theotis Peers [place], [place] 08/12/24 [phone]

## 2024-08-12 NOTE — Discharge Instructions (Addendum)
 It was our pleasure to provide your ER care today - we hope that you feel better.  Drink plenty of fluids/stay well hydrated. Take keflex  (antibiotic) as prescribed. Do not take your next dose of the eliquis , and then resume taking 5 mg 2x/day.  Follow up closely with your primary care doctor in the next 1-2 days for recheck. Follow up with urologist in the next 1-2 weeks.   Return to ER if worse, new  symptoms, fevers, new or severe abdominal pain, unable to void/unable to empty bladder, fainting, other abnormal bruising or bleeding, or other concern.

## 2024-08-12 NOTE — ED Notes (Signed)
 Pt discharged. Pt given discharge papers and papers explained. Pt in NAD at this time

## 2024-08-12 NOTE — ED Triage Notes (Signed)
 C/O hematuria an hour ago. Pt started on eliquis  for PE and DVT. Denies blood in stool/weakness/dizziness. C/O SHOB. Denies dysuria.

## 2024-08-13 LAB — URINE CULTURE: Culture: NO GROWTH

## 2024-08-18 NOTE — Progress Notes (Signed)
 HPI: I had the pleasure of seeing Martin Stephenson today for follow-up in the orthopedic spine clinic.He has a hx of C4-6 ACDF with Dr. Gust on 12/14/21 secondary to cervical myeloradiculopathy with cord signal change.  He states he did have some increased neck pain a few weeks ago.  He went to the emergency room for the neck pain as well as some shortness of breath.  He was found to have a DVT as well as a pulmonary embolism.  They did place him on Eliquis  recently.  He was told he will be on this the rest of his life.  He states at this point the cervical pain has settled down.  They did do an MRI in the ER of his cervical spine.  He actually states since he was discharged from the hospital he has had increased low back pain as well as intermittent right leg pain.  He does have significant scoliotic deformity as well as degenerative changes.  He has had injections in the past with Dr. Merrie which he has done well with.  He has not had any recently.  He states he also has had increased urinary urgency over the last several months.  He has normal perineal sensation.  He denies having any bowel changes.  He was given methocarbamol  to take for muscle spasms.  He has not had any recent steroids.  He was not given any other pain medications.    Review of Systems  Constitutional:  Negative for fever and weight loss.  HENT:  Negative for congestion, hearing loss and sore throat.   Eyes:  Negative for blurred vision.  Respiratory:  Negative for cough and shortness of breath.   Cardiovascular:  Negative for chest pain and palpitations.  Gastrointestinal:  Negative for abdominal pain, nausea and vomiting.  Genitourinary:  Negative for dysuria.  Skin:  Negative for rash.  Neurological:  Negative for dizziness, tremors, speech change and loss of consciousness.  Endo/Heme/Allergies:  Does not bruise/bleed easily.  Psychiatric/Behavioral:  Negative for hallucinations and memory loss.     Imaging: Lumbar XR on [facility]  08/18/24 shows scoliotic deformity 25 degree L1-5.  Grade 1 spondylolisthesis L2-3, L3-4, L4-5. Moderate to severe disc space collapse L2-S1. No obvious fractures are noted   Lumbar MRI on [facility] 08/2022 reveals progressive ML spondylosis: L2-3 mod central and L foraminal stenosis, L3-4 L lateral recess stenosis, L4-5 R>L lateral recess stenosis, L5-S1 disc extrusion with caudal migration causing R>L lateral recess stenosis; ML mod to severe facet hypertrophy diffusely.   MRI cervical 07/31/24 on [facility]:  Interval ACDF at C4-C6. Previously noted disc protrusion at C4-5 is not present on the current  study. Posterior osteophytes result in residual flattening of the ventral cord. Cord compression at C4-5 is overall improved from prior.  Signal abnormality within the cord at C4-5 with volume loss suggestive of  myelomalacia. Small facet effusion on the left at C3-4 with mild edema in the adjacent soft tissues, likely related to degenerative changes. Superimposed infection cannot be excluded. Multilevel degenerative changes as detailed above. Foraminal stenosis  greatest at C4-5 and C7-T1. Spinal canal stenosis at C3-4 and at C7-T1 is slightly increased.    Injections: R L5 SNRB 11/05/22 with 10% relief.. L L345 MBB was positive with >50% relief. L L345 MBB #2 was negative in 2020. L S1 TFESI 11/2021 with some relief, but B L5 TFESI 06/13/22 with significant >75% improvement in pain -- he reports this injection is better than the S1 TFESI.  Physical exam: Patient is alert and oriented x3. Answers questions appropriately. Does not appear ill or toxic. Cranial nerves II-XII are grossly intact. Visual field appear to be intact. No hearing deficits appreciated. Abdomen is soft and non-tender. Chest is without abnormality or deformity. Heart is regular rate and rhythm, no gallups, rubs, or murmurs. BUE 5/5 strength throughout. BLE pulses are palpable, skin is warm and dry, no erythema, or edema is noted. BLE  strength is 5/5 in bilateral EHL, DF, PF, quads, hamstrings, HF, HE, abductors, and adductors. Sensation is symmetric bilaterally. Negative straight leg raise. Negative Belvie Bellman. Patient walks with a narrow-based gait, no instability on exam. Moderate pain secondary to flexion/extension.  Patient has significant low back pain transitioning from seated to standing position. patellar and achilles reflexes 2+ and symmetric bilaterally.    Recommendations: At this point we discussed things at length.  All questions were answered fully.  At this point his biggest complaint is the low back pain and R leg pain.  He certainly has significant deformity as well as instability from L2-S1.  Given his increased urinary urgency as well as his worsening pain I am going get an updated MRI of his lumbar spine for further evaluation.  We had previously discussed with him undergoing an L2-S1 fusion.  Certainly given his recent diagnosis of pulmonary embolism and placement on Eliquis  we are limited as far as proceeding with surgery at the moment.  He potentially could be a candidate to try another injection in his low back to see if we can settle pain down if it persists.  I did place him on a Pred pack today to help with acute inflammation.  I also gave him a prescription for tramadol 50 mg Q8 as needed for pain #15.  He will continue the methocarbamol  prescribed by his PCP for muscle spasms.  We also discussed with him alternating heat and ice.  He has a back brace at home that we discussed wearing to help him transition from seated to standing positions.  Random UDT done in office today. Results appropriate. Questions were encouraged and answered. Patient was instructed to call with any new concerns or problems. Total of 30 minutes was spent today reviewing patient chart, reviewing history, physical exam, education and counseling, medication management and documentation.  Follow-up in 4 weeks with Josette to review MRI.   He can call for tramadol 50mg  refill prior to next appointment.

## 2024-09-01 ENCOUNTER — Telehealth: Payer: Self-pay | Admitting: Nurse Practitioner

## 2024-09-01 NOTE — Progress Notes (Unsigned)
 [facility]   [phone]) [phone] [phone]   Clinic New consult Note   Patient Care Team: Tisovec, Charlie ORN, MD as PCP - General (Internal Medicine) Ladona Heinz, MD as PCP - Cardiology (Cardiology) Ladona Heinz, MD as Consulting Physician (Cardiology) 09/01/2024  CHIEF COMPLAINTS/PURPOSE OF CONSULTATION:  DVT/PE, referred by hospitalist   HISTORY OF PRESENTING ILLNESS:  Martin Stephenson 83 y.o. male with PMH including anemia, arthritis, CHF, GERD, hernia, HL, HTN, kidney stone, OSA, SCC external ear is here because of DVT/PE. He had back surgery in 11/2021. Activity level ***. He was treated for covid19 in 12/2023. He subsequently developed neck pain and dyspnea, went to ED 07/31/2024.  CTA showed bilateral segmental and subsegmental PE primarily within the lower lobes without evidence of right heart strain.  Echo was not concerning.  Doppler showed acute DVT involving the right peroneal veins and a cystic structure in the popliteal fossa, left leg was negative.  He was admitted for heparin  and eventually transition to Eliquis , discharged 08/02/2024 and referred to us  to discuss duration of anticoagulation  ***He was found to have abnormal CBC from *** ***He denies recent chest pain on exertion, shortness of breath on minimal exertion, pre-syncopal episodes, or palpitations. ***He had not noticed any recent bleeding such as epistaxis, hematuria or hematochezia ***The patient denies over the counter NSAID ingestion. He is not *** on antiplatelets agents. His last colonoscopy was *** ***He had no prior history or diagnosis of cancer. His age appropriate screening programs are up-to-date. ***He denies any pica and eats a variety of diet. ***He never donated blood or received blood transfusion ***The patient was prescribed oral iron supplements and he takes ***  MEDICAL HISTORY:  Past Medical History:  Diagnosis Date   Allergy     Anemia    Arthritis    foot   CKD  stage 3b, GFR 30-44 ml/min (HCC) 07/31/2024   Congestive heart failure (HCC)    Constipation    uses mag citrate OTC if needed   GERD (gastroesophageal reflux disease)    Hiatal hernia    Hyperlipidemia    Hypertension    Kidney stone    Sleep apnea    wears cpap   Squamous cell cancer of external ear, right    Tubular adenoma of colon 05/2017    SURGICAL HISTORY: Past Surgical History:  Procedure Laterality Date   CARDIAC CATHETERIZATION     CERVICAL DISCECTOMY  11/2021   COLONOSCOPY     CORONARY ATHERECTOMY N/A 03/14/2021   Procedure: CORONARY ATHERECTOMY;  Surgeon: Ladona Heinz, MD;  Location: [facility] INVASIVE CV LAB;  Service: Cardiovascular;  Laterality: N/A;   CORONARY BALLOON ANGIOPLASTY N/A 03/14/2021   Procedure: CORONARY BALLOON ANGIOPLASTY;  Surgeon: Ladona Heinz, MD;  Location: [facility] INVASIVE CV LAB;  Service: Cardiovascular;  Laterality: N/A;   CORONARY STENT INTERVENTION N/A 03/14/2021   Procedure: CORONARY STENT INTERVENTION;  Surgeon: Ladona Heinz, MD;  Location: [facility] INVASIVE CV LAB;  Service: Cardiovascular;  Laterality: N/A;   CORONARY ULTRASOUND/IVUS N/A 03/14/2021   Procedure: Intravascular Ultrasound/IVUS;  Surgeon: Ladona Heinz, MD;  Location: [facility] INVASIVE CV LAB;  Service: Cardiovascular;  Laterality: N/A;   HAMMER TOE SURGERY  1998   1st toe right foot   hydrocelectomy  1988   NECK SURGERY     POLYPECTOMY     RIGHT/LEFT HEART CATH AND CORONARY ANGIOGRAPHY N/A 03/14/2021   Procedure: RIGHT/LEFT HEART CATH AND CORONARY ANGIOGRAPHY;  Surgeon: Ladona Heinz, MD;  Location: [facility] INVASIVE CV  LAB;  Service: Cardiovascular;  Laterality: N/A;   TONSILLECTOMY  1988    SOCIAL HISTORY: Social History   Socioeconomic History   Marital status: Married    Spouse name: Not on file   Number of children: 4   Years of education: 12   Highest education level: Not on file  Occupational History   Not on file  Tobacco Use   Smoking status: Never   Smokeless tobacco: Never  Vaping Use    Vaping status: Never Used  Substance and Sexual Activity   Alcohol use: No   Drug use: No   Sexual activity: Not on file  Other Topics Concern   Not on file  Social History Narrative   Not on file   Social Drivers of Health   Financial Resource Strain: Low Risk  (08/18/2024)   Received from [facility]   Overall Financial Resource Strain (CARDIA)    How hard is it for you to pay for the very basics like food, housing, medical care, and heating?: Not hard at all  Food Insecurity: No Food Insecurity (08/18/2024)   Received from [facility]   Hunger Vital Sign    Within the past 12 months, you worried that your food would run out before you got the money to buy more.: Never true    Within the past 12 months, the food you bought just didn't last and you didn't have money to get more.: Never true  Transportation Needs: No Transportation Needs (08/18/2024)   Received from [facility] - Transportation    In the past 12 months, has lack of transportation kept you from medical appointments or from getting medications?: No    In the past 12 months, has lack of transportation kept you from meetings, work, or from getting things needed for daily living?: No  Physical Activity: Not on file  Stress: Not on file  Social Connections: Socially Integrated (08/01/2024)   Social Connection and Isolation Panel    Frequency of Communication with Friends and Family: Three times a week    Frequency of Social Gatherings with Friends and Family: Three times a week    Attends Religious Services: 1 to 4 times per year    Active Member of Clubs or Organizations: No    Attends Banker Meetings: 1 to 4 times per year    Marital Status: Married  Catering manager Violence: Not At Risk (08/01/2024)   Humiliation, Afraid, Rape, and Kick questionnaire    Fear of Current or Ex-Partner: No    Emotionally Abused: No    Physically Abused: No    Sexually Abused: No    FAMILY  HISTORY: Family History  Problem Relation Age of Onset   Colon cancer Mother    Hypertension Sister    Hypertension Sister    Hypertension Brother    Stomach cancer Neg Hx    Colon polyps Neg Hx    Esophageal cancer Neg Hx    Rectal cancer Neg Hx     ALLERGIES:  is allergic to labetalol , aleve [naproxen sodium], ibuprofen , and furacin [nitrofurazone].  MEDICATIONS:  Current Outpatient Medications  Medication Sig Dispense Refill   Albuterol Sulfate (PROAIR RESPICLICK) 108 (90 Base) MCG/ACT AEPB 2 puffs into the lungs 4x a day PRN     allopurinol  (ZYLOPRIM ) 300 MG tablet Take 300 mg by mouth daily.      [Paused] amLODipine  (NORVASC ) 5 MG tablet TAKE 1 TABLET BY MOUTH EVERY DAY 90 tablet 3  APIXABAN  (ELIQUIS ) VTE STARTER PACK (10MG  AND 5MG ) Take as directed on package: start with two-5mg  tablets twice daily for 7 days. On day 8, switch to one-5mg  tablet twice daily. 74 each 0   aspirin  81 MG tablet Take 81 mg by mouth daily.     Calcium  Carb-Cholecalciferol (CALCIUM  600/VITAMIN D3 PO) Take 1 tablet by mouth daily.      cephALEXin  (KEFLEX ) 500 MG capsule Take 1 capsule (500 mg total) by mouth 3 (three) times daily. 15 capsule 0   cyanocobalamin  (VITAMIN B12) 500 MCG tablet Take 500 mcg by mouth daily.     ferrous sulfate  325 (65 FE) MG tablet Take 325 mg by mouth every Monday, Wednesday, and Friday.     fluticasone  (FLONASE ) 50 MCG/ACT nasal spray Place 1 spray into both nostrils daily as needed for allergies.     isosorbide  dinitrate (ISORDIL ) 30 MG tablet TAKE 1 TABLET BY MOUTH 2 TIMES DAILY. 180 tablet 3   losartan  (COZAAR ) 25 MG tablet TAKE 1 TABLET BY MOUTH EVERY DAY IN THE EVENING 90 tablet 3   methocarbamol  (ROBAXIN ) 500 MG tablet Take 1 tablet (500 mg total) by mouth every 6 (six) hours as needed for muscle spasms. 30 tablet 0   nebivolol  (BYSTOLIC ) 5 MG tablet TAKE 1 TABLET (5 MG TOTAL) BY MOUTH DAILY. 90 tablet 2   nitroGLYCERIN  (NITROSTAT ) 0.4 MG SL tablet Place 0.4 mg under  the tongue every 5 (five) minutes as needed for chest pain.     rosuvastatin  (CRESTOR ) 10 MG tablet TAKE 1 TABLET BY MOUTH EVERY DAY 90 tablet 3   sodium chloride  (OCEAN) 0.65 % nasal spray Place 1 spray into the nose daily as needed for congestion.     tamsulosin  (FLOMAX ) 0.4 MG CAPS capsule Take 1 capsule by mouth every evening.     torsemide  (DEMADEX ) 10 MG tablet Take 1 tablet (10 mg total) by mouth daily as needed. 1-2 tab daily except Tue and thur as directed (Patient taking differently: Take 10 mg by mouth daily as needed (for fluid).) 110 tablet 3   No current facility-administered medications for this visit.    REVIEW OF SYSTEMS:   Constitutional: Denies fevers, chills or abnormal night sweats Eyes: Denies blurriness of vision, double vision or watery eyes Ears, nose, mouth, throat, and face: Denies mucositis or sore throat Respiratory: Denies cough, dyspnea or wheezes Cardiovascular: Denies palpitation, chest discomfort or lower extremity swelling Gastrointestinal:  Denies nausea, heartburn or change in bowel habits Skin: Denies abnormal skin rashes Lymphatics: Denies new lymphadenopathy or easy bruising Neurological:Denies numbness, tingling or new weaknesses Behavioral/Psych: Mood is stable, no new changes  All other systems were reviewed with the patient and are negative.  PHYSICAL EXAMINATION: ECOG PERFORMANCE STATUS: {CHL ONC ECOG [phone]}  There were no vitals filed for this visit. There were no vitals filed for this visit.  GENERAL:alert, no distress and comfortable SKIN: skin color, texture, turgor are normal, no rashes or significant lesions EYES: normal, conjunctiva are pink and non-injected, sclera clear OROPHARYNX:no exudate, no erythema and lips, buccal mucosa, and tongue normal  NECK: supple, thyroid  normal size, non-tender, without nodularity LYMPH:  no palpable lymphadenopathy in the cervical, axillary or inguinal LUNGS: clear to auscultation and  percussion with normal breathing effort HEART: regular rate & rhythm and no murmurs and no lower extremity edema ABDOMEN:abdomen soft, non-tender and normal bowel sounds Musculoskeletal:no cyanosis of digits and no clubbing  PSYCH: alert & oriented x 3 with fluent speech NEURO: no focal motor/sensory  deficits  LABORATORY DATA:  I have reviewed the data as listed    Latest Ref Rng & Units 08/12/2024    4:45 PM 08/02/2024    2:09 AM 08/01/2024    7:55 AM  CBC  WBC 4.0 - 10.5 K/uL 6.9  6.5  6.2   Hemoglobin 13.0 - 17.0 g/dL 88.7  9.7  89.4   Hematocrit 39.0 - 52.0 % 34.7  29.3  32.6   Platelets 150 - 400 K/uL 314  162  164        Latest Ref Rng & Units 08/12/2024    4:45 PM 08/02/2024    2:09 AM 08/01/2024    7:55 AM  CMP  Glucose 70 - 99 mg/dL 899  891  848   BUN 8 - 23 mg/dL 29  27  24    Creatinine 0.61 - 1.24 mg/dL 8.49  8.49  8.46   Sodium 135 - 145 mmol/L 144  139  139   Potassium 3.5 - 5.1 mmol/L 4.3  3.6  3.8   Chloride 98 - 111 mmol/L 106  105  106   CO2 22 - 32 mmol/L 27  25  23    Calcium  8.9 - 10.3 mg/dL 9.4  8.7  9.0      RADIOGRAPHIC STUDIES: I have personally reviewed the radiological images as listed and agreed with the findings in the report. CT Renal Stone Study Result Date: 08/12/2024 CLINICAL DATA:  Abdominal/flank pain, stone suspected EXAM: CT ABDOMEN AND PELVIS WITHOUT CONTRAST TECHNIQUE: Multidetector CT imaging of the abdomen and pelvis was performed following the standard protocol without IV contrast. RADIATION DOSE REDUCTION: This exam was performed according to the departmental dose-optimization program which includes automated exposure control, adjustment of the mA and/or kV according to patient size and/or use of iterative reconstruction technique. COMPARISON:  10/31/2022 FINDINGS: Lower chest: No acute abnormality. Hepatobiliary: No focal hepatic abnormality. Gallbladder unremarkable. Pancreas: No focal abnormality or ductal dilatation. Spleen: No focal  abnormality.  Normal size. Adrenals/Urinary Tract: No adrenal abnormality. No focal renal abnormality. No stones or hydronephrosis. Urinary bladder is unremarkable. Stomach/Bowel: Normal appendix. Sigmoid diverticulosis. No active diverticulitis. Stomach and small bowel decompressed, unremarkable. Vascular/Lymphatic: No evidence of aneurysm or adenopathy. Aortic atherosclerosis. Reproductive: No visible focal abnormality. Other: No free fluid or free air. Musculoskeletal: No acute bony abnormality. Diffuse degenerative disc and facet disease. IMPRESSION: No acute findings in the abdomen or pelvis. Sigmoid diverticulosis.  No active diverticulitis. Aortic atherosclerosis. Electronically Signed   By: Franky Crease M.D.   On: 08/12/2024 22:10   VAS US  LOWER EXTREMITY VENOUS (DVT) Result Date: 08/02/2024  Lower Venous DVT Study Patient Name:  Martin Stephenson  Date of Exam:   08/02/2024 Medical Rec #: [id number]       Accession #:    [id number] Date of Birth: April 09, 1941       Patient Gender: M Patient Age:   24 years Exam Location:  [facility] Procedure:      VAS US  LOWER EXTREMITY VENOUS (DVT) Referring Phys: TIMOTHY OPYD --------------------------------------------------------------------------------  Indications: Pulmonary embolism, and increased lower extremity swelling.  Risk Factors: CKD IIIb, HFpEF, OSA on CPAP. Limitations: Pitting edema. Comparison Study: Prior negative bilateral LEV done 02/27/17, prior negative right                   LEV done 03/22/17 Performing Technologist: Alberta Lis RVS  Examination Guidelines: A complete evaluation includes B-mode imaging, spectral Doppler, color Doppler, and power Doppler as needed of  all accessible portions of each vessel. Bilateral testing is considered an integral part of a complete examination. Limited examinations for reoccurring indications may be performed as noted. The reflux portion of the exam is performed with the patient in reverse Trendelenburg.   +---------+---------------+---------+-----------+----------+--------------+ RIGHT    CompressibilityPhasicitySpontaneityPropertiesThrombus Aging +---------+---------------+---------+-----------+----------+--------------+ CFV      Full           Yes      Yes                                 +---------+---------------+---------+-----------+----------+--------------+ SFJ      Full                                                        +---------+---------------+---------+-----------+----------+--------------+ FV Prox  Full           Yes      Yes                                 +---------+---------------+---------+-----------+----------+--------------+ FV Mid   Full                                                        +---------+---------------+---------+-----------+----------+--------------+ FV DistalFull                                                        +---------+---------------+---------+-----------+----------+--------------+ PFV      Full           Yes      Yes                                 +---------+---------------+---------+-----------+----------+--------------+ POP      Full           Yes      Yes                                 +---------+---------------+---------+-----------+----------+--------------+ PTV      Full                                                        +---------+---------------+---------+-----------+----------+--------------+ PERO     None                                         Acute          +---------+---------------+---------+-----------+----------+--------------+ Gastroc  Full                                                        +---------+---------------+---------+-----------+----------+--------------+   +---------+---------------+---------+-----------+----------+--------------+  LEFT     CompressibilityPhasicitySpontaneityPropertiesThrombus Aging  +---------+---------------+---------+-----------+----------+--------------+ CFV      Full           Yes      Yes                                 +---------+---------------+---------+-----------+----------+--------------+ SFJ      Full                                                        +---------+---------------+---------+-----------+----------+--------------+ FV Prox  Full                                                        +---------+---------------+---------+-----------+----------+--------------+ FV Mid   Full                                                        +---------+---------------+---------+-----------+----------+--------------+ FV DistalFull           Yes      Yes                                 +---------+---------------+---------+-----------+----------+--------------+ PFV      Full                                                        +---------+---------------+---------+-----------+----------+--------------+ POP      Full           Yes      Yes                                 +---------+---------------+---------+-----------+----------+--------------+ PTV      Full                                                        +---------+---------------+---------+-----------+----------+--------------+ PERO     Full                                                        +---------+---------------+---------+-----------+----------+--------------+ Gastroc  Full                                                        +---------+---------------+---------+-----------+----------+--------------+  Summary: RIGHT: - Findings consistent with acute deep vein thrombosis involving the right peroneal veins.  - A cystic structure is found in the popliteal fossa. Effusion noted medial knee.  LEFT: - There is no evidence of deep vein thrombosis in the lower extremity.  - No cystic structure found in the popliteal fossa.  *See table(s) above for  measurements and observations.    Preliminary     ASSESSMENT & PLAN:  No problem-specific Assessment & Plan notes found for this encounter.    All questions were answered. The patient knows to call the clinic with any problems, questions or concerns. I spent {CHL ONC TIME VISIT - DTPQU:[id number]} counseling the patient face to face. The total time spent in the appointment was {CHL ONC TIME VISIT - DTPQU:[id number]} and more than 50% was on counseling.     Leighla Chestnutt K Kelley Polinsky, NP 09/01/24 12:24 PM

## 2024-09-02 ENCOUNTER — Encounter: Payer: Self-pay | Admitting: Nurse Practitioner

## 2024-09-02 ENCOUNTER — Inpatient Hospital Stay: Attending: Nurse Practitioner | Admitting: Nurse Practitioner

## 2024-09-02 VITALS — BP 137/59 | HR 60 | Temp 98.0°F | Resp 18 | Ht 76.0 in | Wt 234.9 lb

## 2024-09-02 DIAGNOSIS — I2699 Other pulmonary embolism without acute cor pulmonale: Secondary | ICD-10-CM

## 2024-09-03 ENCOUNTER — Other Ambulatory Visit: Payer: Self-pay | Admitting: Nurse Practitioner

## 2024-09-03 ENCOUNTER — Other Ambulatory Visit: Payer: Self-pay | Admitting: Cardiology

## 2024-09-03 DIAGNOSIS — N1832 Chronic kidney disease, stage 3b: Secondary | ICD-10-CM

## 2024-09-03 DIAGNOSIS — I2699 Other pulmonary embolism without acute cor pulmonale: Secondary | ICD-10-CM

## 2024-09-03 NOTE — Progress Notes (Signed)
 Upon further chart review, Dr. Cloretta has concern for moth-eaten/mottled appearance of the bones of the spine and ribs. While this certainly could be benign and/or age-related want to rule out malignancy. Will check MM panel, light chains, and PSA. Due to h/o B12 deficiency and anemia will repeat B12 level. Patient was informed and agrees with recommendations. I will call him with lab results and any further recommendations.   Martin Reh, NP

## 2024-09-04 ENCOUNTER — Telehealth: Payer: Self-pay | Admitting: Nurse Practitioner

## 2024-09-04 NOTE — Telephone Encounter (Signed)
 Called to scheduled LAB appt per Secure chat, left detailed VM

## 2024-09-24 ENCOUNTER — Inpatient Hospital Stay: Attending: Nurse Practitioner

## 2024-09-24 DIAGNOSIS — D539 Nutritional anemia, unspecified: Secondary | ICD-10-CM | POA: Diagnosis not present

## 2024-09-24 DIAGNOSIS — Z86711 Personal history of pulmonary embolism: Secondary | ICD-10-CM | POA: Diagnosis not present

## 2024-09-24 DIAGNOSIS — Z86718 Personal history of other venous thrombosis and embolism: Secondary | ICD-10-CM | POA: Insufficient documentation

## 2024-09-24 DIAGNOSIS — I2699 Other pulmonary embolism without acute cor pulmonale: Secondary | ICD-10-CM

## 2024-09-24 DIAGNOSIS — N1832 Chronic kidney disease, stage 3b: Secondary | ICD-10-CM | POA: Insufficient documentation

## 2024-09-24 DIAGNOSIS — Z79899 Other long term (current) drug therapy: Secondary | ICD-10-CM | POA: Insufficient documentation

## 2024-09-24 LAB — CBC WITH DIFFERENTIAL (CANCER CENTER ONLY)
Abs Immature Granulocytes: 0.01 K/uL (ref 0.00–0.07)
Basophils Absolute: 0 K/uL (ref 0.0–0.1)
Basophils Relative: 1 %
Eosinophils Absolute: 0.1 K/uL (ref 0.0–0.5)
Eosinophils Relative: 1 %
HCT: 33.4 % — ABNORMAL LOW (ref 39.0–52.0)
Hemoglobin: 10.7 g/dL — ABNORMAL LOW (ref 13.0–17.0)
Immature Granulocytes: 0 %
Lymphocytes Relative: 36 %
Lymphs Abs: 2 K/uL (ref 0.7–4.0)
MCH: 32.1 pg (ref 26.0–34.0)
MCHC: 32 g/dL (ref 30.0–36.0)
MCV: 100.3 fL — ABNORMAL HIGH (ref 80.0–100.0)
Monocytes Absolute: 0.4 K/uL (ref 0.1–1.0)
Monocytes Relative: 7 %
Neutro Abs: 3 K/uL (ref 1.7–7.7)
Neutrophils Relative %: 55 %
Platelet Count: 195 K/uL (ref 150–400)
RBC: 3.33 MIL/uL — ABNORMAL LOW (ref 4.22–5.81)
RDW: 14.5 % (ref 11.5–15.5)
WBC Count: 5.5 K/uL (ref 4.0–10.5)
nRBC: 0 % (ref 0.0–0.2)

## 2024-09-24 LAB — VITAMIN B12: Vitamin B-12: 645 pg/mL (ref 180–914)

## 2024-09-25 LAB — KAPPA/LAMBDA LIGHT CHAINS
Kappa free light chain: 32.9 mg/L — ABNORMAL HIGH (ref 3.3–19.4)
Kappa, lambda light chain ratio: 1.12 (ref 0.26–1.65)
Lambda free light chains: 29.4 mg/L — ABNORMAL HIGH (ref 5.7–26.3)

## 2024-09-25 LAB — PROSTATE-SPECIFIC AG, SERUM (LABCORP): Prostate Specific Ag, Serum: 1.4 ng/mL (ref 0.0–4.0)

## 2024-09-27 NOTE — Progress Notes (Signed)
 " Cardiology Office Note:  .   Date:  09/28/2024  ID:  Martin Stephenson, DOB 1941-01-31, MRN [id number] PCP: Vernadine Charlie ORN, MD  [facility] Providers Cardiologist:  Gordy Bergamo, MD   History of Present Illness: .   Martin Stephenson is a 83 y.o.  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  His last hospitalization was on 08/02/2024 with shortness of breath and worsening leg edema, had bilateral segmental and subsegmental pulmonary emboli within the left lower lobes without right heart strain, also venous duplex consistent with acute DVT involving right peroneal veins discharged home on Eliquis , had recurrent ED visit on 08/12/2024 with hematuria, CT of the abdomen did not reveal any acute abnormality and recommended outpatient follow-up with PCP.  Except for chronic dyspnea and continued chronic leg edema he has no other specific complaints, accompanied by his wife.  He is not aware what caused him to have DVT.  Cardiac Studies relevent.    ECHOCARDIOGRAM COMPLETE 08/02/2024  1. Left ventricular ejection fraction, by estimation, is 60 to 65%. The left ventricle has normal function. The left ventricle has no regional wall motion abnormalities. There is mild left ventricular hypertrophy. Left ventricular diastolic parameters are consistent with Grade I diastolic dysfunction (impaired relaxation). 2.  Mild aortic valve regurgitation. 3.  Compared to 09/12/2023, mild to moderate MR and mild to moderate AI not seen.  Right and left heart catheterization 03/14/2021:    Normal right heart catheterization with preserved cardiac output and cardiac index.   4.0 x 8 mm resolute Onyx DES was deployed extending into the distal left main and left main stent was optimized with a 5.0 x 8 mm balloon.  LAD IVUS lumen was 5.8 mm left alone.      Discussed the use of AI scribe software for clinical note transcription with the patient, who gave verbal consent to proceed.  History of Present Illness Martin Stephenson is an 83 year old male with pulmonary embolism and deep vein thrombosis who presents with persistent shortness of breath.  Dyspnea - Persistent shortness of breath unchanged since hospital discharge - No improvement despite anticoagulation therapy with Eliquis  5 mg twice daily  Venous thromboembolism - Diagnosed with pulmonary embolism and deep vein thrombosis in the right leg prior to hospitalization - No recent injury, prolonged travel, or other known risk factors for thromboembolism  Peripheral edema - Leg swelling present throughout the day - Legs appear thinner in the morning  Chronic heart failure - Chronic diastolic heart failure - On Bystolic  5 mg daily, losartan  25 mg daily, isosorbide  dinitrate 30 mg twice daily, Crestor  10 mg daily, and torsemide  10 mg three times a week - Torsemide  is avoided on days he plans to go out  Coronary artery disease - History of coronary artery disease with stent placement in 2022  Labs    Recent Labs    08/01/24 0755 08/02/24 0209 08/12/24 1645  NA 139 139 144  K 3.8 3.6 4.3  CL 106 105 106  CO2 23 25 27   GLUCOSE 151* 108* 100*  BUN 24* 27* 29*  CREATININE 1.53* 1.50*  1.50*  CALCIUM  9.0 8.7* 9.4  GFRNONAA 45* 46* 46*    Lab Results  Component Value Date   ALT 22 10/30/2022   AST 24 10/30/2022   ALKPHOS 58 10/30/2022   BILITOT 0.4 10/30/2022      Latest Ref Rng & Units 09/24/2024   10:25 AM 08/12/2024    4:45 PM 08/02/2024    2:09 AM  CBC  WBC 4.0 - 10.5 K/uL 5.5  6.9  6.5    Hemoglobin 13.0 - 17.0 g/dL 89.2  88.7  9.7   Hematocrit 39.0 - 52.0 % 33.4  34.7  29.3   Platelets 150 - 400 K/uL 195  314  162    No results found for: HGBA1C  Lab Results  Component Value Date   TSH 1.18 03/20/2017    Care everywhere/Faxed External Labs:  07/19/2023:  TC 101, tg 48, hdl 41 AND ldl 50  ROS  Review of Systems  Cardiovascular:  Positive for dyspnea on exertion and leg swelling (CHRONIC). Negative for chest pain.   Physical Exam:   VS:  BP (!) 156/72 (BP Location: Left Arm, Patient Position: Sitting, Cuff Size: Normal)   Pulse (!) 55   Ht 6' 4 (1.93 m)   Wt 230 lb (104.3 kg)   SpO2 98%   BMI 28.00 kg/m    Wt Readings from Last 3 Encounters:  09/28/24 230 lb (104.3 kg)  09/02/24 234 lb 14.4 oz (106.5 kg)  08/12/24 227 lb (103 kg)    BP Readings from Last 3 Encounters:  09/28/24 (!) 156/72  09/02/24 (!) 137/59  08/12/24 (!) 149/88   Physical Exam Neck:     Vascular: No carotid bruit or JVD.  Cardiovascular:     Rate and Rhythm: Normal rate and regular rhythm.     Pulses: Intact distal pulses.     Heart sounds: Normal heart sounds. No murmur heard.    No gallop.  Pulmonary:     Effort: Pulmonary effort is normal.     Breath sounds: Normal breath sounds.  Abdominal:     General: Bowel sounds are normal.     Palpations: Abdomen is soft.  Musculoskeletal:     Right lower leg: Edema (2-3+ billateral pitting edema, non tender) present.     Left lower leg: Edema (2-3+ billateral pitting edema, non tender) present.    EKG:         ASSESSMENT AND PLAN: .      ICD-10-CM   1. Chronic diastolic congestive heart failure (HCC)  I50.32 pindolol  (VISKEN ) 10 MG tablet    2. Coronary artery disease of native artery of native heart with stable angina pectoris  I25.118     3. Essential hypertension  I10 pindolol  (VISKEN ) 10 MG tablet    4. Bilateral leg edema  R60.0     5. Primary hypercoagulable state  D68.59       Assessment and  Plan Assessment & Plan Pulmonary embolism and right lower extremity deep vein thrombosis in the setting of primary hypercoagulable state Recent pulmonary embolism and right lower extremity DVT. Currently on Eliquis  5 mg twice daily. Primary hypercoagulable state likely requires lifelong anticoagulation, pending hematology consultation. - Continue Eliquis  5 mg twice daily - Discontinue aspirin  - Consult hematology for further management of hypercoagulable state (has already been ordered during inpatient admit)  Chronic diastolic heart failure Chronic diastolic heart failure is well-managed. No signs of heart failure exacerbation. Current medications include a beta-blocker and an ARB. - Continue current  medications for heart failure management  Atherosclerotic heart disease of native coronary artery with angina Coronary artery disease with stent placement in 2022. No need for aspirin  due to recent pulmonary embolism and anticoagulation with Eliquis . - Continue Eliquis  for anticoagulation - Discontinue aspirin  to reduce the risk of bleeding.  Essential hypertension Blood pressure slightly elevated due to discontinuation of amlodipine . Plan to switch from Bystolic  to pindolol  for blood pressure management. - Discontinue Bystolic  - Initiate pindolol  10 mg twice daily - If blood pressure is not well-controlled, could reinitiate 5 mg of amlodipine  daily.  Chronic lower extremity edema Chronic lower extremity edema persists, likely exacerbated by recent DVT. - Advise wearing compression stockings within 30 minutes of waking up - Consider using an aid for easier application of compression stockings   Follow up: 1 Year  Signed,  Gordy Bergamo, MD, [facility] 09/28/2024, 4:18 PM [facility] [address] [place], [place] [address] Phone: [phone]. Fax:  [phone]  "

## 2024-09-28 ENCOUNTER — Encounter: Payer: Self-pay | Admitting: Cardiology

## 2024-09-28 ENCOUNTER — Ambulatory Visit: Attending: Cardiology | Admitting: Cardiology

## 2024-09-28 VITALS — BP 156/72 | HR 55 | Ht 76.0 in | Wt 230.0 lb

## 2024-09-28 DIAGNOSIS — I5032 Chronic diastolic (congestive) heart failure: Secondary | ICD-10-CM

## 2024-09-28 DIAGNOSIS — R600 Localized edema: Secondary | ICD-10-CM | POA: Diagnosis not present

## 2024-09-28 DIAGNOSIS — I10 Essential (primary) hypertension: Secondary | ICD-10-CM | POA: Diagnosis not present

## 2024-09-28 DIAGNOSIS — I25118 Atherosclerotic heart disease of native coronary artery with other forms of angina pectoris: Secondary | ICD-10-CM | POA: Diagnosis not present

## 2024-09-28 DIAGNOSIS — I351 Nonrheumatic aortic (valve) insufficiency: Secondary | ICD-10-CM

## 2024-09-28 DIAGNOSIS — D6859 Other primary thrombophilia: Secondary | ICD-10-CM

## 2024-09-28 LAB — MULTIPLE MYELOMA PANEL, SERUM
Albumin SerPl Elph-Mcnc: 3.2 g/dL (ref 2.9–4.4)
Albumin/Glob SerPl: 1.2 (ref 0.7–1.7)
Alpha 1: 0.2 g/dL (ref 0.0–0.4)
Alpha2 Glob SerPl Elph-Mcnc: 0.8 g/dL (ref 0.4–1.0)
B-Globulin SerPl Elph-Mcnc: 1.1 g/dL (ref 0.7–1.3)
Gamma Glob SerPl Elph-Mcnc: 0.8 g/dL (ref 0.4–1.8)
Globulin, Total: 2.9 g/dL (ref 2.2–3.9)
IgA: 226 mg/dL (ref 61–437)
IgG (Immunoglobin G), Serum: 948 mg/dL (ref 603–1613)
IgM (Immunoglobulin M), Srm: 36 mg/dL (ref 15–143)
Total Protein ELP: 6.1 g/dL (ref 6.0–8.5)

## 2024-09-28 MED ORDER — PINDOLOL 10 MG PO TABS: 10.0000 mg | ORAL_TABLET | Freq: Two times a day (BID) | ORAL | 3 refills | Status: DC

## 2024-09-28 NOTE — Patient Instructions (Signed)
 Thank you for choosing [facility]!     Medication Instructions:  Your medication list has been updated *If you need a refill on your cardiac medications before your next appointment, please call your pharmacy*   Lab Work: No labs were ordered during today's visit.  If you have labs (blood work) drawn today and your tests are completely normal, you will receive your results only by: MyChart Message (if you have MyChart) OR A paper copy in the mail If you have any lab test that is abnormal or we need to change your treatment, we will call you to review the results.   Testing/Procedures: No procedures were ordered during today's visit.   Your next appointment:   1 year(s)   Provider:   Gordy Bergamo, MD     Follow-Up: At [facility], you and your health needs are our priority.  As part of our continuing mission to provide you with exceptional heart care, we have created designated Provider Care Teams.  These Care Teams include your primary Cardiologist (physician) and Advanced Practice Providers (APPs -  Physician Assistants and Nurse Practitioners) who all work together to provide you with the care you need, when you need it. We recommend signing up for the patient portal called MyChart.  Sign up information is provided on this After Visit Summary.  MyChart is used to connect with patients for Virtual Visits (Telemedicine).  Patients are able to view lab/test results, encounter notes, upcoming appointments, etc.  Non-urgent messages can be sent to your provider as well.   To learn more about what you can do with MyChart, go to [email or web address].

## 2024-10-01 ENCOUNTER — Ambulatory Visit: Payer: Self-pay | Admitting: Nurse Practitioner

## 2024-10-01 NOTE — Telephone Encounter (Signed)
-----   Message from Mayme MARLA Silversmith sent at 10/01/2024  1:46 PM EDT ----- Please let pt know labs: PSA is normal. Light chains slightly elevated but non-specific, with normal ratio and SPEP. Overall not concerning for blood condition/cancer or prostate cancer. (We checked  these due to abnormal appearance of the bones on a previous scan from August). No further work up at this time. Please keep scheduled f/up in November.   Thanks Lacie NP ----- Message ----- From: Rebecka, Lab In [place] Sent: 09/24/2024  10:34 AM EDT To: Lacie K Burton, NP

## 2024-10-01 NOTE — Telephone Encounter (Signed)
 Patient gave verbal understanding and had no further question

## 2024-10-14 ENCOUNTER — Telehealth: Payer: Self-pay | Admitting: Cardiology

## 2024-10-14 DIAGNOSIS — I10 Essential (primary) hypertension: Secondary | ICD-10-CM

## 2024-10-14 DIAGNOSIS — I5032 Chronic diastolic (congestive) heart failure: Secondary | ICD-10-CM

## 2024-10-14 MED ORDER — PINDOLOL 10 MG PO TABS
10.0000 mg | ORAL_TABLET | Freq: Two times a day (BID) | ORAL | 3 refills | Status: AC
Start: 2024-10-14 — End: ?

## 2024-10-14 NOTE — Telephone Encounter (Signed)
 Pt's medication was sent to pt's pharmacy as requested. Confirmation received.

## 2024-10-14 NOTE — Telephone Encounter (Signed)
*  STAT* If patient is at the pharmacy, call can be transferred to refill team.   1. Which medications need to be refilled? (please list name of each medication and dose if known)  pindolol (VISKEN) 10 MG tablet   NEW PHARMACY   4. Which pharmacy/location (including street and city if local pharmacy) is medication to be sent to?  [id number] [address], [place] - [address] Phone: [phone]  Fax: [phone]       5. Do they need a 30 day or 90 day supply? 90

## 2024-10-27 ENCOUNTER — Telehealth: Payer: Self-pay | Admitting: Nurse Practitioner

## 2024-10-27 NOTE — Telephone Encounter (Signed)
 Called PT to update on resheduled appt. Day and time confirmed.

## 2024-11-04 ENCOUNTER — Other Ambulatory Visit

## 2024-11-04 ENCOUNTER — Ambulatory Visit: Admitting: Nurse Practitioner

## 2024-11-05 ENCOUNTER — Other Ambulatory Visit

## 2024-11-05 ENCOUNTER — Ambulatory Visit: Admitting: Nurse Practitioner

## 2024-11-05 ENCOUNTER — Telehealth: Payer: Self-pay | Admitting: Nurse Practitioner

## 2024-11-05 NOTE — Telephone Encounter (Signed)
 Pt called to check the date of his next scheduled appt. Informed pt of appt date and time

## 2024-11-12 ENCOUNTER — Inpatient Hospital Stay: Admitting: Nurse Practitioner

## 2024-11-12 ENCOUNTER — Inpatient Hospital Stay: Attending: Nurse Practitioner

## 2024-11-12 ENCOUNTER — Encounter: Payer: Self-pay | Admitting: Nurse Practitioner

## 2024-11-12 VITALS — BP 161/61 | HR 54 | Temp 97.5°F | Resp 18 | Ht 76.0 in | Wt 244.6 lb

## 2024-11-12 DIAGNOSIS — I2699 Other pulmonary embolism without acute cor pulmonale: Secondary | ICD-10-CM

## 2024-11-12 LAB — CBC WITH DIFFERENTIAL (CANCER CENTER ONLY)
Abs Immature Granulocytes: 0.01 K/uL (ref 0.00–0.07)
Basophils Absolute: 0 K/uL (ref 0.0–0.1)
Basophils Relative: 0 %
Eosinophils Absolute: 0.1 K/uL (ref 0.0–0.5)
Eosinophils Relative: 2 %
HCT: 31.6 % — ABNORMAL LOW (ref 39.0–52.0)
Hemoglobin: 10.4 g/dL — ABNORMAL LOW (ref 13.0–17.0)
Immature Granulocytes: 0 %
Lymphocytes Relative: 41 %
Lymphs Abs: 1.9 K/uL (ref 0.7–4.0)
MCH: 32.2 pg (ref 26.0–34.0)
MCHC: 32.9 g/dL (ref 30.0–36.0)
MCV: 97.8 fL (ref 80.0–100.0)
Monocytes Absolute: 0.4 K/uL (ref 0.1–1.0)
Monocytes Relative: 9 %
Neutro Abs: 2.3 K/uL (ref 1.7–7.7)
Neutrophils Relative %: 48 %
Platelet Count: 169 K/uL (ref 150–400)
RBC: 3.23 MIL/uL — ABNORMAL LOW (ref 4.22–5.81)
RDW: 15 % (ref 11.5–15.5)
WBC Count: 4.8 K/uL (ref 4.0–10.5)
nRBC: 0 % (ref 0.0–0.2)

## 2024-11-12 MED ORDER — APIXABAN 2.5 MG PO TABS: 2.5000 mg | ORAL_TABLET | Freq: Two times a day (BID) | ORAL | 1 refills | Status: AC

## 2024-11-12 NOTE — Progress Notes (Signed)
 [facility]   Telephone:([phone] Fax:([phone]) [phone]    Patient Care Team: Tisovec, Charlie ORN, MD as PCP - General (Internal Medicine) Ladona Heinz, MD as PCP - Cardiology (Cardiology) Ladona Heinz, MD as Consulting Physician (Cardiology)   CHIEF COMPLAINT: Follow-up DVT/PE and anemia  CURRENT THERAPY: Indefinite anticoagulation  INTERVAL HISTORY Martin Stephenson returns for follow-up as scheduled, last seen 09/02/2024. Doing well without significant changes. Appetite is normal, energy level remains fair. Exertional dyspnea is stable. Chronic leg edema at baseline. Denies bleeding. Taking eliquis  as prescribed 5 mg BID.  ROS  All other systems reviewed and negative   Past Medical History:  Diagnosis Date   Allergy     Anemia    Arthritis    foot   CKD stage 3b, GFR 30-44 ml/min (HCC) 07/31/2024   Congestive heart failure (HCC)    Constipation    uses mag citrate OTC if needed   GERD (gastroesophageal reflux disease)    Hiatal hernia    Hyperlipidemia    Hypertension    Kidney stone    Sleep apnea    wears cpap   Squamous cell cancer of external ear, right    Tubular adenoma of colon 05/2017     Past Surgical History:  Procedure Laterality Date   CARDIAC CATHETERIZATION     CERVICAL DISCECTOMY  11/2021   COLONOSCOPY     CORONARY ATHERECTOMY N/A 03/14/2021   Procedure: CORONARY ATHERECTOMY;  Surgeon: Ladona Heinz, MD;  Location: [facility] INVASIVE CV LAB;  Service: Cardiovascular;  Laterality: N/A;   CORONARY BALLOON ANGIOPLASTY N/A 03/14/2021   Procedure: CORONARY BALLOON ANGIOPLASTY;  Surgeon: Ladona Heinz, MD;  Location: [facility] INVASIVE CV LAB;  Service: Cardiovascular;  Laterality: N/A;   CORONARY STENT INTERVENTION N/A 03/14/2021   Procedure: CORONARY STENT INTERVENTION;  Surgeon: Ladona Heinz, MD;  Location: [facility] INVAS[facility] CV [facility];  Service: Cardiovascular;  Laterality: N/A;   CORONARY ULTRASOUND/IVUS N/A 03/14/2021   Procedure: Intravascular Ultrasound/IVUS;  Surgeon:  Ladona Heinz, MD;  Location: [facility] INVASIVE CV LAB;  Service: Cardiovascular;  Laterality: N/A;   HAMMER TOE SURGERY  1998   1st toe right foot   hydrocelectomy  1988   NECK SURGERY     POLYPECTOMY     RIGHT/LEFT HEART CATH AND CORONARY ANGIOGRAPHY N/A 03/14/2021   Procedure: RIGHT/LEFT HEART CATH AND CORONARY ANGIOGRAPHY;  Surgeon: Ladona Heinz, MD;  Location: [facility] INVASIVE CV LAB;  Service: Cardiovascular;  Laterality: N/A;   TONSILLECTOMY  1988     Outpatient Encounter Medications as of 11/12/2024  Medication Sig Note   Albuterol Sulfate (PROAIR RESPICLICK) 108 (90 Base) MCG/ACT AEPB 2 puffs into the lungs 4x a day PRN    allopurinol  (ZYLOPRIM ) 300 MG tablet Take 300 mg by mouth daily.     apixaban  (ELIQUIS ) 2.5 MG TABS tablet Take 1 tablet (2.5 mg total) by mouth 2 (two) times daily.    Calcium  Carb-Cholecalciferol (CALCIUM  600/VITAMIN D3 PO) Take 1 tablet by mouth daily.     ferrous sulfate  325 (65 FE) MG tablet Take 325 mg by mouth every Monday, Wednesday, and Friday.    fluticasone  (FLONASE ) 50 MCG/ACT nasal spray Place 1 spray into both nostrils daily as needed for allergies.    isosorbide  dinitrate (ISORDIL ) 30 MG tablet TAKE 1 TABLET BY MOUTH 2 TIMES DAILY.    losartan  (COZAAR ) 25 MG tablet TAKE 1 TABLET BY MOUTH EVERY DAY IN THE EVENING    nitroGLYCERIN  (NITROSTAT ) 0.4 MG SL tablet Place 0.4  mg under the tongue every 5 (five) minutes as needed for chest pain.    pindolol (VISKEN) 10 MG tablet Take 1 tablet (10 mg total) by mouth 2 (two) times daily.    rosuvastatin  (CRESTOR ) 10 MG tablet TAKE 1 TABLET BY MOUTH EVERY DAY    sodium chloride  (OCEAN) 0.65 % nasal spray Place 1 spray into the nose daily as needed for congestion.    tamsulosin  (FLOMAX ) 0.4 MG CAPS capsule Take 1 capsule by mouth every evening.    torsemide  (DEMADEX ) 10 MG tablet Take 1 tablet (10 mg total) by mouth daily as needed. 1-2 tab daily except Tue and thur as directed (Patient taking differently: Take 10 mg by mouth  daily as needed (for fluid).)    [DISCONTINUED] APIXABAN  (ELIQUIS ) VTE STARTER PACK (10MG  AND 5MG ) Take as directed on package: start with two-5mg  tablets twice daily for 7 days. On day 8, switch to one-5mg  tablet twice daily. 11/12/2024: change to lower dose/maintenance   No facility-administered encounter medications on file as of 11/12/2024.     Today's Vitals   11/12/24 0900 11/12/24 0942  BP:  (!) 161/61  Pulse:  (!) 54  Resp:  18  Temp:  (!) 97.5 F (36.4 C)  SpO2:  98%  Weight:  244 lb 9.6 oz (110.9 kg)  Height:  6' 4 (1.93 m)  PainSc: 0-No pain    Body mass index is 29.77 kg/m.    PHYSICAL EXAM GENERAL:alert, no distress and comfortable SKIN: no rash  EYES: sclera clear LUNGS: clear with normal breathing effort HEART: regular rate & rhythm, R>L lower extremity edema NEURO: alert & oriented x 3 with fluent speech, no focal motor/sensory deficits   CBC    Latest Ref Rng & Units 11/12/2024    9:30 AM 09/24/2024   10:25 AM 08/12/2024    4:45 PM  CBC  WBC 4.0 - 10.5 K/uL 4.8  5.5  6.9   Hemoglobin 13.0 - 17.0 g/dL 89.5  89.2  88.7   Hematocrit 39.0 - 52.0 % 31.6  33.4  34.7   Platelets 150 - 400 K/uL 169  195  314       CMP     Latest Ref Rng & Units 08/12/2024    4:45 PM 08/02/2024    2:09 AM 08/01/2024    7:55 AM  CMP  Glucose 70 - 99 mg/dL 899  891  848   BUN 8 - 23 mg/dL 29  27  24    Creatinine 0.61 - 1.24 mg/dL 8.49  8.49  8.46   Sodium 135 - 145 mmol/L 144  139  139   Potassium 3.5 - 5.1 mmol/L 4.3  3.6  3.8   Chloride 98 - 111 mmol/L 106  105  106   CO2 22 - 32 mmol/L 27  25  23    Calcium  8.9 - 10.3 mg/dL 9.4  8.7  9.0       ASSESSMENT & PLAN: 83 year old male   Acute DVT R peroneal veins and bilateral PE -Presented to ED 07/31/2024 with neck pain and worsening dyspnea -CTA: bilateral segmental and subsegmental PE primarily within the lower lobes without evidence of right heart strain.   -Echo was not concerning.   -Doppler: acute DVT involving  the right peroneal veins and a cystic structure in the popliteal fossa, left leg was negative.   -Admitted for heparin  and eventually transitioned to Eliquis , discharged 08/02/2024 -Completed 3 months full dose anticoagulation 11/12/2024  Baseline R>L LE  edema 2/2 ?CHF and dyspnea 2/2 ?CHF, ?Asthma  Macrocytic anemia, Hgb 10-12 range since at least 2018. Managed by [facility], periodically on oral B12 and oral iron MWF; likely 2/2 chronic renal failure Cervical fusion 11/2021 - recovered well  Covid-19 infection 12/2023 - recovered well  CKD, HTN, HL, CHF, CAD s/p stents SCC external ear  ?  Lytic bone lesions -Normal SPEP, kappa lambda light chain ratio, and PSA on 09/24/2024 -Likely benign   Disposition:  Martin Stephenson appears well, tolerating full dose eliquis . No signs of recurrent thrombosis.   I recommend to reduce to lower intensity/prophylactic anticoagulation with eliquis  2.5 mg BID. He agrees. A new prescription was sent.   We will see him back in 6 months, or sooner if he develops signs of recurrent thrombosis or bleeding on anticoagulation.    Orders Placed This Encounter  Procedures   CBC with Differential (Cancer Center Only)    Standing Status:   Future    Expected Date:   05/12/2025    Expiration Date:   11/12/2025   Basic Metabolic Panel - Cancer Center Only    Standing Status:   Future    Expected Date:   05/12/2025    Expiration Date:   11/12/2025      All questions were answered. The patient knows to call the clinic with any problems, questions or concerns. No barriers to learning were detected.   Branch Pacitti K Venora Kautzman, NP 11/12/2024

## 2024-11-30 ENCOUNTER — Encounter: Payer: Self-pay | Admitting: Podiatry

## 2024-11-30 ENCOUNTER — Ambulatory Visit: Admitting: Podiatry

## 2024-11-30 DIAGNOSIS — B351 Tinea unguium: Secondary | ICD-10-CM

## 2024-11-30 DIAGNOSIS — M79675 Pain in left toe(s): Secondary | ICD-10-CM | POA: Diagnosis not present

## 2024-11-30 DIAGNOSIS — L84 Corns and callosities: Secondary | ICD-10-CM | POA: Diagnosis not present

## 2024-11-30 DIAGNOSIS — M79674 Pain in right toe(s): Secondary | ICD-10-CM | POA: Diagnosis not present

## 2024-11-30 DIAGNOSIS — D689 Coagulation defect, unspecified: Secondary | ICD-10-CM | POA: Diagnosis not present

## 2024-11-30 NOTE — Progress Notes (Signed)
 Subjective:   Patient ID: Martin Stephenson, male   DOB: 83 y.o.   MRN: [id number]   HPI Patient presents with chronic lesion subfourth metatarsal right and left first metatarsal with patient on blood thinner and elongated nailbeds 1-5 both feet that he cannot take care of   ROS      Objective:  Physical Exam  Neurovascular status intact with chronic keratotic lesion bilateral and nail disease 1-5 both feet bothersome     Assessment:  Chronic lesions with high risk factor patient on blood thinner with nail disease symptomatic bilateral     Plan:  Debridement of nailbeds 1-5 both feet debridement of lesions bilateral no iatrogenic bleeding

## 2024-12-18 ENCOUNTER — Other Ambulatory Visit: Payer: Self-pay | Admitting: *Deleted

## 2024-12-18 ENCOUNTER — Telehealth: Payer: Self-pay | Admitting: Cardiology

## 2024-12-18 DIAGNOSIS — E782 Mixed hyperlipidemia: Secondary | ICD-10-CM

## 2024-12-18 MED ORDER — ROSUVASTATIN CALCIUM 10 MG PO TABS: 10.0000 mg | ORAL_TABLET | Freq: Every day | ORAL | 3 refills | Status: AC

## 2024-12-18 NOTE — Telephone Encounter (Signed)
*  STAT* If patient is at the pharmacy, call can be transferred to refill team.   1. Which medications need to be refilled? (please list name of each medication and dose if known) rosuvastatin  (CRESTOR ) 10 MG tablet    2. Would you like to learn more about the convenience, safety, & potential cost savings by using the [facility]?    3. Are you open to using the [facility] (Type [facility].  ).   4. Which pharmacy/location (including street and city if local pharmacy) is medication to be sent to? [id number] [place], [place] - [address]    5. Do they need a 30 day or 90 day supply? 90 day

## 2025-05-12 ENCOUNTER — Inpatient Hospital Stay

## 2025-05-12 ENCOUNTER — Inpatient Hospital Stay: Admitting: Nurse Practitioner
